# Patient Record
Sex: Male | Born: 1965
Health system: Southern US, Community
[De-identification: ages and names within clinical notes are randomized; demographics above are authoritative.]

## PROBLEM LIST (undated history)

## (undated) DIAGNOSIS — L409 Psoriasis, unspecified: Secondary | ICD-10-CM

## (undated) DIAGNOSIS — Z8719 Personal history of other diseases of the digestive system: Secondary | ICD-10-CM

## (undated) DIAGNOSIS — K219 Gastro-esophageal reflux disease without esophagitis: Secondary | ICD-10-CM

## (undated) DIAGNOSIS — E669 Obesity, unspecified: Secondary | ICD-10-CM

## (undated) DIAGNOSIS — Z8249 Family history of ischemic heart disease and other diseases of the circulatory system: Secondary | ICD-10-CM

## (undated) DIAGNOSIS — I10 Essential (primary) hypertension: Secondary | ICD-10-CM

## (undated) DIAGNOSIS — R5600 Simple febrile convulsions: Secondary | ICD-10-CM

## (undated) DIAGNOSIS — I872 Venous insufficiency (chronic) (peripheral): Secondary | ICD-10-CM

## (undated) DIAGNOSIS — J302 Other seasonal allergic rhinitis: Secondary | ICD-10-CM

## (undated) HISTORY — DX: Gastro-esophageal reflux disease without esophagitis: K21.9

## (undated) HISTORY — PX: TOE SURGERY: SHX1073

## (undated) HISTORY — DX: Psoriasis, unspecified: L40.9

## (undated) HISTORY — DX: Essential (primary) hypertension: I10

## (undated) HISTORY — PX: OTHER SURGICAL HISTORY: SHX169

## (undated) HISTORY — DX: Family history of ischemic heart disease and other diseases of the circulatory system: Z82.49

## (undated) HISTORY — DX: Obesity, unspecified: E66.9

## (undated) HISTORY — PX: CYSTECTOMY: SUR359

---

## 1998-01-15 ENCOUNTER — Ambulatory Visit (HOSPITAL_COMMUNITY): Admission: RE | Admit: 1998-01-15 | Discharge: 1998-01-15 | Payer: Self-pay | Admitting: Internal Medicine

## 2000-08-06 ENCOUNTER — Emergency Department (HOSPITAL_COMMUNITY): Admission: EM | Admit: 2000-08-06 | Discharge: 2000-08-06 | Payer: Self-pay | Admitting: Emergency Medicine

## 2001-01-12 ENCOUNTER — Encounter: Payer: Self-pay | Admitting: Gastroenterology

## 2001-01-18 ENCOUNTER — Encounter (INDEPENDENT_AMBULATORY_CARE_PROVIDER_SITE_OTHER): Payer: Self-pay | Admitting: Specialist

## 2001-01-18 ENCOUNTER — Encounter: Payer: Self-pay | Admitting: Gastroenterology

## 2001-01-18 ENCOUNTER — Other Ambulatory Visit: Admission: RE | Admit: 2001-01-18 | Discharge: 2001-01-18 | Payer: Self-pay | Admitting: Gastroenterology

## 2002-08-02 ENCOUNTER — Encounter: Payer: Self-pay | Admitting: Family Medicine

## 2002-08-02 ENCOUNTER — Encounter: Admission: RE | Admit: 2002-08-02 | Discharge: 2002-08-02 | Payer: Self-pay | Admitting: Family Medicine

## 2002-08-22 ENCOUNTER — Encounter: Admission: RE | Admit: 2002-08-22 | Discharge: 2002-08-22 | Payer: Self-pay | Admitting: General Surgery

## 2002-08-22 ENCOUNTER — Encounter: Payer: Self-pay | Admitting: General Surgery

## 2002-09-10 ENCOUNTER — Ambulatory Visit (HOSPITAL_BASED_OUTPATIENT_CLINIC_OR_DEPARTMENT_OTHER): Admission: RE | Admit: 2002-09-10 | Discharge: 2002-09-10 | Payer: Self-pay | Admitting: General Surgery

## 2002-09-10 ENCOUNTER — Encounter (INDEPENDENT_AMBULATORY_CARE_PROVIDER_SITE_OTHER): Payer: Self-pay | Admitting: *Deleted

## 2002-11-07 ENCOUNTER — Encounter: Payer: Self-pay | Admitting: Family Medicine

## 2002-11-07 ENCOUNTER — Encounter: Admission: RE | Admit: 2002-11-07 | Discharge: 2002-11-07 | Payer: Self-pay | Admitting: Family Medicine

## 2004-05-04 ENCOUNTER — Encounter: Payer: Self-pay | Admitting: Cardiology

## 2006-01-28 ENCOUNTER — Ambulatory Visit: Payer: Self-pay | Admitting: Family Medicine

## 2007-03-09 ENCOUNTER — Ambulatory Visit: Payer: Self-pay | Admitting: Family Medicine

## 2007-04-04 ENCOUNTER — Ambulatory Visit: Payer: Self-pay | Admitting: Family Medicine

## 2007-04-10 ENCOUNTER — Ambulatory Visit: Payer: Self-pay | Admitting: Family Medicine

## 2007-04-17 ENCOUNTER — Ambulatory Visit: Payer: Self-pay | Admitting: Family Medicine

## 2008-03-14 ENCOUNTER — Ambulatory Visit: Payer: Self-pay | Admitting: Family Medicine

## 2008-04-30 ENCOUNTER — Ambulatory Visit: Payer: Self-pay | Admitting: Family Medicine

## 2008-06-06 ENCOUNTER — Ambulatory Visit: Payer: Self-pay | Admitting: Family Medicine

## 2008-09-23 ENCOUNTER — Ambulatory Visit: Payer: Self-pay | Admitting: Gastroenterology

## 2008-09-23 DIAGNOSIS — R1013 Epigastric pain: Secondary | ICD-10-CM

## 2008-09-23 DIAGNOSIS — K219 Gastro-esophageal reflux disease without esophagitis: Secondary | ICD-10-CM

## 2008-09-25 ENCOUNTER — Ambulatory Visit (HOSPITAL_COMMUNITY): Admission: RE | Admit: 2008-09-25 | Discharge: 2008-09-25 | Payer: Self-pay | Admitting: Gastroenterology

## 2008-09-26 ENCOUNTER — Telehealth (INDEPENDENT_AMBULATORY_CARE_PROVIDER_SITE_OTHER): Payer: Self-pay

## 2008-10-04 ENCOUNTER — Ambulatory Visit (HOSPITAL_COMMUNITY): Admission: RE | Admit: 2008-10-04 | Discharge: 2008-10-04 | Payer: Self-pay | Admitting: Gastroenterology

## 2009-03-18 ENCOUNTER — Ambulatory Visit: Payer: Self-pay | Admitting: Family Medicine

## 2009-08-30 ENCOUNTER — Encounter: Admission: RE | Admit: 2009-08-30 | Discharge: 2009-08-30 | Payer: Self-pay | Admitting: Family Medicine

## 2009-09-03 ENCOUNTER — Ambulatory Visit: Payer: Self-pay | Admitting: Family Medicine

## 2009-09-26 ENCOUNTER — Encounter: Payer: Self-pay | Admitting: Cardiology

## 2009-09-26 ENCOUNTER — Ambulatory Visit: Payer: Self-pay | Admitting: Family Medicine

## 2009-11-10 ENCOUNTER — Ambulatory Visit: Payer: Self-pay | Admitting: Family Medicine

## 2010-01-13 ENCOUNTER — Ambulatory Visit: Payer: Self-pay | Admitting: Family Medicine

## 2010-01-13 ENCOUNTER — Encounter: Payer: Self-pay | Admitting: Cardiology

## 2010-03-20 ENCOUNTER — Ambulatory Visit: Payer: Self-pay | Admitting: Family Medicine

## 2010-03-20 ENCOUNTER — Encounter: Payer: Self-pay | Admitting: Cardiology

## 2010-03-20 DIAGNOSIS — Z9189 Other specified personal risk factors, not elsewhere classified: Secondary | ICD-10-CM | POA: Insufficient documentation

## 2010-03-20 DIAGNOSIS — I10 Essential (primary) hypertension: Secondary | ICD-10-CM

## 2010-03-31 ENCOUNTER — Ambulatory Visit: Payer: Self-pay | Admitting: Cardiology

## 2010-03-31 ENCOUNTER — Encounter: Payer: Self-pay | Admitting: Cardiology

## 2010-03-31 DIAGNOSIS — E785 Hyperlipidemia, unspecified: Secondary | ICD-10-CM | POA: Insufficient documentation

## 2010-03-31 DIAGNOSIS — R0602 Shortness of breath: Secondary | ICD-10-CM | POA: Insufficient documentation

## 2010-04-23 ENCOUNTER — Telehealth (INDEPENDENT_AMBULATORY_CARE_PROVIDER_SITE_OTHER): Payer: Self-pay | Admitting: *Deleted

## 2010-04-24 ENCOUNTER — Ambulatory Visit: Payer: Self-pay

## 2010-04-24 ENCOUNTER — Ambulatory Visit (HOSPITAL_COMMUNITY): Admission: RE | Admit: 2010-04-24 | Payer: Self-pay | Source: Home / Self Care | Admitting: Cardiology

## 2010-05-04 ENCOUNTER — Telehealth (INDEPENDENT_AMBULATORY_CARE_PROVIDER_SITE_OTHER): Payer: Self-pay | Admitting: *Deleted

## 2010-05-05 ENCOUNTER — Ambulatory Visit (HOSPITAL_COMMUNITY)
Admission: RE | Admit: 2010-05-05 | Discharge: 2010-05-05 | Payer: Self-pay | Source: Home / Self Care | Attending: Cardiovascular Disease | Admitting: Cardiovascular Disease

## 2010-06-12 ENCOUNTER — Ambulatory Visit
Admission: RE | Admit: 2010-06-12 | Discharge: 2010-06-12 | Payer: Self-pay | Source: Home / Self Care | Attending: Family Medicine | Admitting: Family Medicine

## 2010-06-16 NOTE — Progress Notes (Signed)
Summary: Stress Echo Pre-Procedure  Phone Note Outgoing Call Call back at [facility] Phone [phone]   Call placed by: Antionette Char RN,  April 23, 2010 8:30 AM Call placed to: Patient Reason for Call: Confirm/change Appt Summary of Call: Message left on AM regarding Stress Echo instructions.

## 2010-06-16 NOTE — Assessment & Plan Note (Signed)
Summary: NP6/ FAMILY H/O CARDIAC DISEASE. PT HAS BCBS./ [place]   CC:  referal from Dr Susann Givens.  History of Present Illness: 45 year old male for evaluation of multiple risk factors and dyspnea. No prior cardiac history. Recent LDL was 111. Patient states that he has dyspnea with more extreme activities but not with routine activities. It is relieved with rest. There is no associated chest pain. There is no orthopnea, PND, pedal edema, palpitations or syncope. There is no exertional chest pain. His father had a myocardial infarction at age 66 and died at age 65 of a second event. Because of the above we were asked to further evaluate.  Current Medications (verified): 1)  Nizatidine 150 Mg Caps (Nizatidine) .Marland Kitchen.. 1 Capsule By Mouth Once Daily 2)  Amlodipine Besylate 10 Mg Tabs (Amlodipine Besylate) .Marland Kitchen.. 1 Tablet By Mouth Once Daily 3)  Hydrochlorothiazide 25 Mg Tabs (Hydrochlorothiazide) .Marland Kitchen.. 1 Tablet By Mouth Once Daily 4)  Multivitamins   Tabs (Multiple Vitamin) .Marland Kitchen.. 1  Tab By Mouth Once Daily 5)  Aspirin 81 Mg  Tabs (Aspirin) .Marland Kitchen.. 1  Tab By Mouth Once Daily  Allergies: No Known Drug Allergies  Past History:  Past Medical History: HYPERTENSION  GERD Hiatal Hernia  Past Surgical History: R great toe surgery Cyst removed from left breast  Family History: Reviewed history from 09/23/2008 and no changes required. Family History of Heart Disease: Father with MI at age 81 and died at 69 of MI No FH of Colon Cancer:  Social History: Reviewed history from 09/23/2008 and no changes required. Married Patient has never smoked.  Alcohol Use - occasional Illicit Drug Use - no Occupation: Clinical biochemist. Daily Caffeine Use  3 cups per day  Review of Systems       no fevers or chills, productive cough, hemoptysis, dysphasia, odynophagia, melena, hematochezia, dysuria, hematuria, rash, seizure activity, orthopnea, PND, pedal edema, claudication. Remaining systems are negative.   Vital  Signs:  Patient profile:   45 year old male Height:      70 inches Weight:      252 pounds BMI:     36.29 Pulse rate:   76 / minute Resp:     14 per minute BP sitting:   129 / 88  (left arm)  Vitals Entered By: Kem Parkinson (March 31, 2010 3:35 PM)  Physical Exam  General:  Well developed/well nourished in NAD Skin warm/dry Patient not depressed No peripheral clubbing Back-normal HEENT-normal/normal eyelids Neck supple/normal carotid upstroke bilaterally; no bruits; no JVD; no thyromegaly chest - CTA/ normal expansion CV - RRR/normal S1 and S2; no murmurs, rubs or gallops;  PMI nondisplaced Abdomen -NT/ND, no HSM, no mass, + bowel sounds, no bruit 2+ femoral pulses, no bruits Ext-no edema, chords, 2+ DP Neuro-grossly nonfocal     EKG  Procedure date:  03/31/2010  Findings:      Sinus rhythm, first degree AV block, nonspecific T-wave changes.  Impression & Recommendations:  Problem # 1:  HYPERTENSION (ICD-401.9) Blood pressure borderline. He will continue on his present medications. He will track his blood pressure at home. Given his strong family history I would like for his systolic blood pressure to be less than 130 and his diastolic to be less than 85. His updated medication list for this problem includes:    Amlodipine Besylate 10 Mg Tabs (Amlodipine besylate) .Marland Kitchen... 1 tablet by mouth once daily    Hydrochlorothiazide 25 Mg Tabs (Hydrochlorothiazide) .Marland Kitchen... 1 tablet by mouth once daily    Aspirin 81  Mg Tabs (Aspirin) .Marland Kitchen... 1  tab by mouth once daily  Problem # 2:  DYSPNEA (ICD-786.05) Mild dyspnea with more extreme activities. Strong family history of coronary disease as well as hypertension. Schedule stress echocardiogram for risk stratification. This will also help quantify LV function. Given strong family history of coronary disease I have recommended that he begin an exercise program if the stress echocardiogram is normal. His updated medication list for  this problem includes:    Amlodipine Besylate 10 Mg Tabs (Amlodipine besylate) .Marland Kitchen... 1 tablet by mouth once daily    Hydrochlorothiazide 25 Mg Tabs (Hydrochlorothiazide) .Marland Kitchen... 1 tablet by mouth once daily    Aspirin 81 Mg Tabs (Aspirin) .Marland Kitchen... 1  tab by mouth once daily  Problem # 3:  HYPERLIPIDEMIA (ICD-272.4) LDL 111. He will begin a diet and have his primary care check his lipids again in 3 months. If LDL greater than 100 I would recommend a statin given his strong family history.  Other Orders: Stress Echo (Stress Echo)  Patient Instructions: 1)  Your physician has requested that you have a stress echocardiogram. For further information please visit [email or web address]  Please follow instruction sheet as given.

## 2010-06-18 NOTE — Letter (Signed)
Summary: [facility] Office Note   Randy [name]'s Office Note   Imported By: Roderic Ovens 04/27/2010 16:03:42  _____________________________________________________________________  External Attachment:    Type:   Image     Comment:   External Document

## 2010-06-18 NOTE — Progress Notes (Signed)
Summary: Stress Echo instructions  Phone Note Outgoing Call Call back at [facility] Phone [phone]   Call placed by: Stanton Kidney, EMT-P,  May 04, 2010 3:18 PM Action Taken: Phone Call Completed Summary of Call: Left message for instructions ref: stress echo. Stanton Kidney, EMT-P  May 04, 2010 3:19 PM

## 2010-06-18 NOTE — Letter (Signed)
Summary: [facility] Office Note   [facility] Office Note   Imported By: Roderic Ovens 04/27/2010 16:04:50  _____________________________________________________________________  External Attachment:    Type:   Image     Comment:   External Document

## 2010-06-18 NOTE — Progress Notes (Signed)
Summary: Patient Medical History/Med List   Patient Medical History/Med List   Imported By: Roderic Ovens 04/27/2010 16:03:06  _____________________________________________________________________  External Attachment:    Type:   Image     Comment:   External Document

## 2010-06-18 NOTE — Letter (Signed)
Summary: [facility] Ekg   Imported By: Roderic Ovens 04/27/2010 16:07:50  _____________________________________________________________________  External Attachment:    Type:   Image     Comment:   External Document

## 2010-09-15 ENCOUNTER — Ambulatory Visit (INDEPENDENT_AMBULATORY_CARE_PROVIDER_SITE_OTHER): Payer: BC Managed Care – PPO | Admitting: Medical

## 2010-09-15 DIAGNOSIS — T148XXA Other injury of unspecified body region, initial encounter: Secondary | ICD-10-CM

## 2010-10-02 NOTE — Op Note (Signed)
   NAMESAYLOR, Randy [name]                          ACCOUNT NO.:  [id number]   MEDICAL RECORD NO.:  [id number]                   PATIENT TYPE:  AMB   LOCATION:  DSC                                  FACILITY:  [facility]   PHYSICIAN:  Gabrielle Dare. Janee Morn, M.D.             DATE OF BIRTH:  1965-07-20   DATE OF PROCEDURE:  09/10/2002  DATE OF DISCHARGE:                                 OPERATIVE REPORT   PREOPERATIVE DIAGNOSIS:  Right subareolar breast mass.   POSTOPERATIVE DIAGNOSIS:  Right subareolar breast mass.   PROCEDURE:  Excision of right subareolar breast mass.   SURGEON:  Gabrielle Dare. Janee Morn, M.D.   ANESTHESIA:  MAC with local.   CLINICAL HISTORY:  The patient is a 45 year old male with a history of  hypertension, who is very active in weight training and sports and presented  complaining with a two to three-month history of discomfort beneath his  right nipple.  I evaluated him and diagnosed him with a likely gynecomastia.  He was subsequently evaluated at the breast center with mammography and this  was consistent with benign gynecomastia, and we are proceeding today with  excision of this subareolar mass.   DESCRIPTION OF PROCEDURE:  After informed consent was obtained, the patient  was brought to the operating room, intravenous antibiotics were  administered, MAC anesthesia was given.  His right chest was prepped and  draped in a sterile fashion.  Marcaine 0.25% with epinephrine in a 1:1 ratio  with 1% lidocaine was infused for local anesthesia.  A curvilinear infra-  areolar incision was made along his areolar border inferiorly.  Subcutaneous  tissues were dissected down and this mass was carefully dissected from the  underside of the areola and circumferentially down.  Bovie cautery was used  to maintain excellent hemostasis, and the mass was excised in one piece.  It  was firm and rubbery and about 3 cm in size.  The remaining tissue bed was  irrigated.  No other abnormalities  were palpable.  Bovie cautery was used to  ensure hemostasis and the wound was closed with running 4-0 Monocryl  subcuticular stitch.  Benzoin and Steri-Strips and sterile dressings were  applied.  Sponge, needle, and instrument counts were all correct.  The  patient tolerated the procedure well and was taken to the recovery room in  stable condition.                                               Gabrielle Dare Janee Morn, M.D.    BET/MEDQ  D:  09/10/2002  T:  09/10/2002  Job:  [id number]

## 2010-11-11 ENCOUNTER — Encounter: Payer: Self-pay | Admitting: Medical

## 2010-11-11 ENCOUNTER — Other Ambulatory Visit: Payer: Self-pay

## 2010-11-11 ENCOUNTER — Ambulatory Visit (INDEPENDENT_AMBULATORY_CARE_PROVIDER_SITE_OTHER): Payer: BC Managed Care – PPO | Admitting: Medical

## 2010-11-11 VITALS — BP 140/80 | HR 80 | Temp 98.0°F | Ht 71.0 in | Wt 252.0 lb

## 2010-11-11 DIAGNOSIS — I8390 Asymptomatic varicose veins of unspecified lower extremity: Secondary | ICD-10-CM

## 2010-11-11 DIAGNOSIS — M25571 Pain in right ankle and joints of right foot: Secondary | ICD-10-CM

## 2010-11-11 DIAGNOSIS — R609 Edema, unspecified: Secondary | ICD-10-CM

## 2010-11-11 DIAGNOSIS — M25579 Pain in unspecified ankle and joints of unspecified foot: Secondary | ICD-10-CM

## 2010-11-11 DIAGNOSIS — S96919A Strain of unspecified muscle and tendon at ankle and foot level, unspecified foot, initial encounter: Secondary | ICD-10-CM

## 2010-11-11 DIAGNOSIS — S93409A Sprain of unspecified ligament of unspecified ankle, initial encounter: Secondary | ICD-10-CM

## 2010-11-11 MED ORDER — IBUPROFEN 800 MG PO TABS: 800.0000 mg | ORAL_TABLET | Freq: Three times a day (TID) | ORAL | Status: AC | PRN

## 2010-11-11 NOTE — Progress Notes (Addendum)
  Subjective:   HPI  Randy [name] is a 45 y.o. male who presents for c/o right ankle pain x 5 days.  Was at the beach last week on vacation, was throwing footballs, and had a twisting injury.  He denies pop or snap.  Denies trauma directly. No fall.  Been using ice. Has been able to bear weight then and now.  No other aggravating or relieving factors.  No other c/o.  He c/o ankle swelling in general since being on Amlodipine.  Want to know if any thing can be done about this.  He also c/o possible varicose vein of his right thigh.  At times gest irritated or prominent if he has been on his feet for prolonged period of time.  The following portions of the patient's history were reviewed and updated as appropriate: allergies, current medications, past family history, past medical history, past social history, past surgical history and problem list.   Review of Systems Constitutional: denies fever, chills, sweats, unexpected weight change, anorexia, fatigue Dermatology: denies rash Cardiology: denies chest pain, palpitations, edema Respiratory: denies cough, shortness of breath, wheezing Gastroenterology: denies abdominal pain, nausea, vomiting, diarrhea Neurology: no weakness, tingling, numbness      Objective:   Physical Exam  General appearance: alert, no distress, WD/WN, white male Musculoskeletal: right medial ankle with tenderness over deltoid ligament as well as superior to medial malleolus and lower calve tenderness.   No tenderness of the achilles, mild pain with foot inversion, mild swelling over medial ankle generalized, otherwise bilat LE nontender, no swelling, no obvious deformity, normal ROM Extremities: right upper medial thigh with a few mild varicosities, no cyanosis, no clubbing Pulses: 2+ symmetric, lower extremities, normal cap refill Neurological: normal feet sensation and strength   Assessment :    Encounter Diagnoses  Name Primary?  Marland Kitchen Ankle pain, right Yes    . Ankle strain   . Edema   . Varicose vein of leg      Plan:    Ankle strain - discussed xray findings, and no apparent fracture seen or other acute abnormality.  Will send xray for over read.  Advised rest, ice, elevation, script for Ibuprofen given.   Call report in 1-2 wk.  If not improving return.   Discussed rehab exercises once the pain and swelling has resolved.  Edema - secondary to Amlodipine.  Reassured.    Varicose veins - mild, upper right leg, advised regular exercise.

## 2010-11-16 ENCOUNTER — Encounter: Payer: Self-pay | Admitting: Family Medicine

## 2011-02-05 ENCOUNTER — Other Ambulatory Visit (INDEPENDENT_AMBULATORY_CARE_PROVIDER_SITE_OTHER): Payer: BC Managed Care – PPO

## 2011-02-05 DIAGNOSIS — Z23 Encounter for immunization: Secondary | ICD-10-CM

## 2011-03-29 ENCOUNTER — Encounter: Payer: Self-pay | Admitting: Family Medicine

## 2011-03-29 ENCOUNTER — Ambulatory Visit (INDEPENDENT_AMBULATORY_CARE_PROVIDER_SITE_OTHER): Payer: BC Managed Care – PPO | Admitting: Family Medicine

## 2011-03-29 VITALS — BP 122/82 | HR 76 | Ht 71.25 in | Wt 244.0 lb

## 2011-03-29 DIAGNOSIS — E669 Obesity, unspecified: Secondary | ICD-10-CM | POA: Insufficient documentation

## 2011-03-29 DIAGNOSIS — Z0000 Encounter for general adult medical examination without abnormal findings: Secondary | ICD-10-CM

## 2011-03-29 DIAGNOSIS — Z8249 Family history of ischemic heart disease and other diseases of the circulatory system: Secondary | ICD-10-CM

## 2011-03-29 DIAGNOSIS — I10 Essential (primary) hypertension: Secondary | ICD-10-CM

## 2011-03-29 DIAGNOSIS — E785 Hyperlipidemia, unspecified: Secondary | ICD-10-CM

## 2011-03-29 DIAGNOSIS — K219 Gastro-esophageal reflux disease without esophagitis: Secondary | ICD-10-CM

## 2011-03-29 DIAGNOSIS — I8393 Asymptomatic varicose veins of bilateral lower extremities: Secondary | ICD-10-CM

## 2011-03-29 DIAGNOSIS — I8390 Asymptomatic varicose veins of unspecified lower extremity: Secondary | ICD-10-CM

## 2011-03-29 LAB — LIPID PANEL
Total CHOL/HDL Ratio: 4.3 Ratio
VLDL: 27 mg/dL (ref 0–40)

## 2011-03-29 LAB — POCT URINALYSIS DIPSTICK
Blood, UA: NEGATIVE
Glucose, UA: NEGATIVE
Leukocytes, UA: NEGATIVE
Nitrite, UA: NEGATIVE
Urobilinogen, UA: NEGATIVE

## 2011-03-29 LAB — CBC WITH DIFFERENTIAL/PLATELET
Basophils Absolute: 0 10*3/uL (ref 0.0–0.1)
Eosinophils Absolute: 0.2 10*3/uL (ref 0.0–0.7)
Eosinophils Relative: 2 % (ref 0–5)
Lymphocytes Relative: 27 % (ref 12–46)
MCH: 30.1 pg (ref 26.0–34.0)
MCV: 86.3 fL (ref 78.0–100.0)
Platelets: 343 10*3/uL (ref 150–400)
RDW: 13.1 % (ref 11.5–15.5)
WBC: 7.8 10*3/uL (ref 4.0–10.5)

## 2011-03-29 LAB — COMPREHENSIVE METABOLIC PANEL
ALT: 32 U/L (ref 0–53)
AST: 18 U/L (ref 0–37)
Creat: 1.13 mg/dL (ref 0.50–1.35)
Total Bilirubin: 0.6 mg/dL (ref 0.3–1.2)

## 2011-03-29 NOTE — Progress Notes (Signed)
Subjective:    Patient ID: Randy Randy [name], male    DOB: [birth year], 45 y.o.   MRN: [id number]  HPI  he is here for complete examination. He has had some difficulty with medial thigh pain and swelling and does have varicosities in that area. He also complains of occasionally seeing specks of blood in his sperm. Last time this occurred was over a month ago. He is having no urgency, dysuria, discharge. He does have a history of heart disease in his family. He has recently made some dietary and exercise changes and apparently has lost will do the way. His reflux is under good control present medications.   Review of Systems  Constitutional: Negative.   HENT: Negative.   Eyes: Negative.   Respiratory: Negative.   Cardiovascular: Negative.   Gastrointestinal: Negative.   Genitourinary: Negative.   Musculoskeletal: Negative.   Skin: Negative.   Neurological: Negative.   Hematological: Negative.   Psychiatric/Behavioral: Negative.        Objective:   Physical Exam BP 122/82  Pulse 76  Ht 5' 11.25" (1.81 m)  Wt 244 lb (110.678 kg)  BMI 33.79 kg/m2  General Appearance:    Alert, cooperative, no distress, appears stated age  Head:    Normocephalic, without obvious abnormality, atraumatic  Eyes:    PERRL, conjunctiva/corneas clear, EOM's intact, fundi    benign  Ears:    Normal TM's and external ear canals  Nose:   Nares normal, mucosa normal, no drainage or sinus   tenderness  Throat:   Lips, mucosa, and tongue normal; teeth and gums normal  Neck:   Randy [name], no lymphadenopathy;  thyroid:  no   enlargement/tenderness/nodules; no carotid   bruit or JVD  Back:    Spine nontender, no curvature, ROM normal, no CVA     tenderness  Lungs:     Clear to auscultation bilaterally without wheezes, rales or     ronchi; respirations unlabored  Chest Wall:    No tenderness or deformity   Heart:    Regular rate and rhythm, S1 and S2 normal, no murmur, rub   or gallop  Breast Exam:    No chest wall  tenderness, masses or gynecomastia  Abdomen:     Soft, non-tender, nondistended, normoactive bowel sounds,    no masses, no hepatosplenomegaly  Genitalia:    Normal male external genitalia without lesions.  Testicles without masses.  No inguinal hernias.  Rectal:    Normal sphincter tone, no masses or tenderness; guaiac negative stool.  Prostate smooth, no nodules, not enlarged.  Extremities:   No clubbing, cyanosis or edema  Pulses:   2+ and symmetric all extremities  Skin:   Skin color, texture, turgor normal, no rashes or lesions  Lymph nodes:   Cervical, supraclavicular, and axillary nodes normal  Neurologic:   CNII-XII intact, normal strength, sensation and gait; reflexes 2+ and symmetric throughout          Psych:   Normal mood, affect, hygiene and grooming.          Assessment & Plan:   1. Physical exam, annual  POCT Urinalysis Dipstick, Visual acuity screening, POCT Hemoccult (POC) Blood/Stool Test, CBC with Differential, Comprehensive metabolic panel, Lipid panel  2. GERD    3. HYPERLIPIDEMIA    4. HYPERTENSION    5. Family history of heart disease in male family member before age 51  CBC with Differential, Comprehensive metabolic panel, Lipid panel  6. Obesity (BMI 30-39.9)    7.  Varicose veins of legs

## 2011-03-29 NOTE — Patient Instructions (Addendum)
Continue with present weight loss and dietary modification. He is also return here if he sees blood in his sperm again. No particular therapy for his varicosities unless they get worse

## 2011-03-31 ENCOUNTER — Other Ambulatory Visit: Payer: Self-pay | Admitting: Family Medicine

## 2011-04-19 ENCOUNTER — Other Ambulatory Visit: Payer: Self-pay | Admitting: Family Medicine

## 2011-05-31 ENCOUNTER — Ambulatory Visit: Payer: BC Managed Care – PPO | Admitting: Family Medicine

## 2011-06-01 ENCOUNTER — Telehealth: Payer: Self-pay | Admitting: Family Medicine

## 2011-06-01 MED ORDER — HYDROCHLOROTHIAZIDE 25 MG PO TABS: 25.0000 mg | ORAL_TABLET | Freq: Every day | ORAL | Status: DC

## 2011-06-01 MED ORDER — NIZATIDINE 150 MG PO CAPS: 150.0000 mg | ORAL_CAPSULE | Freq: Every day | ORAL | Status: DC

## 2011-06-01 MED ORDER — AMLODIPINE BESYLATE 10 MG PO TABS: 10.0000 mg | ORAL_TABLET | Freq: Every day | ORAL | Status: DC

## 2011-06-01 NOTE — Telephone Encounter (Signed)
Refilled for #90 with 3 refills on hctz and norvasc and #90 with 2 refills on nizatide

## 2011-06-07 ENCOUNTER — Other Ambulatory Visit: Payer: Managed Care, Other (non HMO)

## 2011-06-07 DIAGNOSIS — E785 Hyperlipidemia, unspecified: Secondary | ICD-10-CM

## 2011-06-07 LAB — LIPID PANEL
HDL: 40 mg/dL (ref >39)
Total CHOL/HDL Ratio: 4.4 Ratio
Triglycerides: 117 mg/dL (ref <150)

## 2011-12-26 ENCOUNTER — Emergency Department (HOSPITAL_COMMUNITY)
Admission: EM | Admit: 2011-12-26 | Discharge: 2011-12-26 | Disposition: A | Discharge status: Home / Self Care | Payer: Managed Care, Other (non HMO) | Attending: Emergency Medicine | Admitting: Emergency Medicine

## 2011-12-26 ENCOUNTER — Encounter (HOSPITAL_COMMUNITY): Payer: Self-pay

## 2011-12-26 ENCOUNTER — Emergency Department (HOSPITAL_COMMUNITY): Payer: Managed Care, Other (non HMO)

## 2011-12-26 DIAGNOSIS — R079 Chest pain, unspecified: Secondary | ICD-10-CM | POA: Insufficient documentation

## 2011-12-26 DIAGNOSIS — I10 Essential (primary) hypertension: Secondary | ICD-10-CM | POA: Insufficient documentation

## 2011-12-26 DIAGNOSIS — K219 Gastro-esophageal reflux disease without esophagitis: Secondary | ICD-10-CM | POA: Insufficient documentation

## 2011-12-26 LAB — COMPREHENSIVE METABOLIC PANEL
ALT: 22 U/L (ref 0–53)
Alkaline Phosphatase: 70 U/L (ref 39–117)
CO2: 28 mEq/L (ref 19–32)
Chloride: 96 mEq/L (ref 96–112)
GFR calc Af Amer: >90 mL/min (ref >90)
Glucose, Bld: 122 mg/dL — ABNORMAL HIGH (ref 70–99)
Potassium: 3 mEq/L — ABNORMAL LOW (ref 3.5–5.1)
Sodium: 135 mEq/L (ref 135–145)
Total Bilirubin: 0.6 mg/dL (ref 0.3–1.2)
Total Protein: 7.8 g/dL (ref 6.0–8.3)

## 2011-12-26 LAB — CBC WITH DIFFERENTIAL/PLATELET
Basophils Absolute: 0 10*3/uL (ref 0.0–0.1)
Basophils Relative: 0 % (ref 0–1)
Eosinophils Absolute: 0.1 10*3/uL (ref 0.0–0.7)
Eosinophils Relative: 1 % (ref 0–5)
HCT: 44.7 % (ref 39.0–52.0)
MCHC: 36 g/dL (ref 30.0–36.0)
MCV: 85.3 fL (ref 78.0–100.0)
Monocytes Absolute: 0.8 10*3/uL (ref 0.1–1.0)
RDW: 12.3 % (ref 11.5–15.5)

## 2011-12-26 NOTE — ED Notes (Signed)
The patient is AOx4 and comfortable with his discharge instructions. 

## 2011-12-26 NOTE — ED Notes (Signed)
Pt reports his father passed away at the age of 61 w/an MI

## 2011-12-26 NOTE — ED Provider Notes (Signed)
History     CSN: [id number]  Arrival date & time 12/26/11  1757   First MD Initiated Contact with Patient 12/26/11 1937      Chief Complaint  Patient presents with  . Chest Pain    (Consider location/radiation/quality/duration/timing/severity/associated sxs/prior treatment) HPI  Past Medical History  Diagnosis Date  . Hypertension   . Obesity   . Psoriasis   . Family history of ischemic heart disease   . GERD (gastroesophageal reflux disease)     History reviewed. No pertinent past surgical history.  Family History  Problem Relation Age of Onset  . Heart disease Father   . Hypertension Father   . Heart failure Father     History  Substance Use Topics  . Smoking status: Never Smoker   . Smokeless tobacco: Never Used  . Alcohol Use: 1.5 oz/week    3 drink(s) per week      Review of Systems  Allergies  Review of patient's allergies indicates no known allergies.  Home Medications   Current Outpatient Rx  Name Route Sig Dispense Refill  . AMLODIPINE BESYLATE 10 MG PO TABS Oral Take 10 mg by mouth daily.    . ASPIRIN 81 MG PO TABS Oral Take 81 mg by mouth daily.      . OMEGA-3 FATTY ACIDS 1000 MG PO CAPS Oral Take 3 g by mouth daily.    Marland Kitchen HYDROCHLOROTHIAZIDE 25 MG PO TABS Oral Take 25 mg by mouth daily.    . ADULT MULTIVITAMIN W/MINERALS CH Oral Take 1 tablet by mouth daily.    Marland Kitchen NIZATIDINE 150 MG PO CAPS Oral Take 150 mg by mouth daily.      BP 138/87  Pulse 74  Temp 98.5 F (36.9 C) (Oral)  Resp 16  Ht 5' 10.5" (1.791 m)  Wt 237 lb (107.502 kg)  BMI 33.53 kg/m2  SpO2 100%  Physical Exam  ED Course  Procedures (including critical care time)  Labs Reviewed  COMPREHENSIVE METABOLIC PANEL - Abnormal; Notable for the following:    Potassium 3.0 (*)     Glucose, Bld 122 (*)     GFR calc non Af Amer 78 (*)     All other components within normal limits  CBC WITH DIFFERENTIAL  POCT I-STAT TROPONIN I   Dg Chest 2 View  12/26/2011  *RADIOLOGY  REPORT*  Clinical Data: Chest pain  CHEST - 2 VIEW  Comparison: None.  Findings: Mild patchy left lower lobe opacity, atelectasis versus pneumonia.  No pleural effusion or pneumothorax.  Heart is normal in size.  Mild degenerative changes of the visualized thoracolumbar spine.  IMPRESSION: Mild patchy left lower lobe opacity, atelectasis versus pneumonia.  Original Report Authenticated By: Charline Bills, M.D.     1. Chest pain       MDM  Chest pain        Cheri Guppy, MD 12/26/11 2030

## 2011-12-26 NOTE — ED Notes (Signed)
Pt reports dizziness and nauseous on Thursday, pt reports waking Friday w/pain behind (L) ear radiating into (L) lateral neck, pt went to UC yesterday they completed an EKG and instructed pt to f/u w/cardiologist on Monday. Pt reports mid-sternum chest tightness radiating under (R) breast, and abd pressure/tightness. Pt reports decrease appetite today

## 2011-12-27 ENCOUNTER — Encounter: Payer: Self-pay | Admitting: Internal Medicine

## 2011-12-27 ENCOUNTER — Telehealth: Payer: Self-pay | Admitting: Family Medicine

## 2011-12-27 ENCOUNTER — Other Ambulatory Visit: Payer: Self-pay

## 2011-12-27 DIAGNOSIS — R9431 Abnormal electrocardiogram [ECG] [EKG]: Secondary | ICD-10-CM

## 2011-12-27 NOTE — Telephone Encounter (Signed)
Seen in [place] and at [facility] [address], patient states you have a copy of his EKG (brought in to you by his sister today). The urgent care is calling him wanting to know who his cardiologist is and he wants to talk to you. Did tell patient that according to his chart, we scheduled him to see cardiologist Olga Millers back in 2011.  Patient would like you to call him

## 2011-12-27 NOTE — Telephone Encounter (Signed)
Set him up to see Dr. Jens Som for abnormal EKG.

## 2011-12-27 NOTE — Telephone Encounter (Signed)
PT HAS APPT WITH CRENSHAW AUG.27 AT 11;15 PT AWARE Doffing [phone]

## 2012-01-11 ENCOUNTER — Ambulatory Visit (INDEPENDENT_AMBULATORY_CARE_PROVIDER_SITE_OTHER): Payer: Managed Care, Other (non HMO) | Admitting: Cardiology

## 2012-01-11 ENCOUNTER — Encounter: Payer: Self-pay | Admitting: Cardiology

## 2012-01-11 VITALS — BP 136/86 | HR 71 | Ht 70.0 in | Wt 241.0 lb

## 2012-01-11 DIAGNOSIS — R9431 Abnormal electrocardiogram [ECG] [EKG]: Secondary | ICD-10-CM | POA: Insufficient documentation

## 2012-01-11 DIAGNOSIS — I10 Essential (primary) hypertension: Secondary | ICD-10-CM

## 2012-01-11 DIAGNOSIS — E875 Hyperkalemia: Secondary | ICD-10-CM

## 2012-01-11 DIAGNOSIS — R079 Chest pain, unspecified: Secondary | ICD-10-CM | POA: Insufficient documentation

## 2012-01-11 LAB — BASIC METABOLIC PANEL
BUN: 11 mg/dL (ref 6–23)
Chloride: 100 mEq/L (ref 96–112)
Creatinine, Ser: 1.1 mg/dL (ref 0.4–1.5)
GFR: 74.79 mL/min (ref >60.00)
Glucose, Bld: 97 mg/dL (ref 70–99)
Potassium: 3.4 mEq/L — ABNORMAL LOW (ref 3.5–5.1)

## 2012-01-11 NOTE — Assessment & Plan Note (Signed)
Blood pressure controlled. Continue present medications. His potassium was low in the emergency room. Repeat and supplement as needed.

## 2012-01-11 NOTE — Patient Instructions (Addendum)
Your physician wants you to follow-up in: ONE YEAR WITH DR CRENSHAW You will receive a reminder letter in the mail two months in advance. If you don't receive a letter, please call our office to schedule the follow-up appointment.  

## 2012-01-11 NOTE — Assessment & Plan Note (Signed)
Patient developed mild chest pressure when he was "stressed" from being told he had had a previous MI. He is not having exertional chest pain. Previous stress echocardiogram normal. I do not think further ischemia evaluation is warranted at this time.

## 2012-01-11 NOTE — Assessment & Plan Note (Signed)
I have reviewed the patient's electrocardiogram. The tracing on August 10 showed nondiagnostic inferior Q waves. I do not think he has had a previous myocardial infarction.

## 2012-01-11 NOTE — Progress Notes (Signed)
   HPI: 46 year old male for evaluation of abnormal electrocardiogram and chest pain. Stress echocardiogram in December of 2011 normal. Patient seen in the emergency room in August of 2013 with complaints of chest pain. Electrocardiogram showed sinus rhythm with no ST changes. Chest x-ray showed mild patchy left lower lobe opacity, atelectasis versus pneumonia. 1 troponin normal. Hemoglobin 16.1. Potassium 3.0. Patient recently seen at urgent care for neck pain. During that evaluation he was told he may have had a previous heart attack based on his electrocardiogram. The following night he had mild tightness for 1 minute in his chest because he was "stressed". He does not have dyspnea on exertion, orthopnea, PND, pedal edema, syncope or exertional chest pain.  Current Outpatient Prescriptions  Medication Sig Dispense Refill  . amLODipine (NORVASC) 10 MG tablet Take 10 mg by mouth daily.      Marland Kitchen aspirin 81 MG tablet Take 81 mg by mouth daily.        . hydrochlorothiazide (HYDRODIURIL) 25 MG tablet Take 25 mg by mouth daily.      . Multiple Vitamin (MULTIVITAMIN WITH MINERALS) TABS Take 1 tablet by mouth daily.      . nizatidine (AXID) 150 MG capsule Take 150 mg by mouth daily.      . Omega-3 Fatty Acids (FISH OIL PO) Take 1 tablet by mouth daily.         Past Medical History  Diagnosis Date  . Hypertension   . Obesity   . Psoriasis   . Family history of ischemic heart disease   . GERD (gastroesophageal reflux disease)     No past surgical history on file.  History   Social History  . Marital Status: Married    Spouse Name: N/A    Number of Children: N/A  . Years of Education: N/A   Occupational History  . Not on file.   Social History Main Topics  . Smoking status: Never Smoker   . Smokeless tobacco: Never Used  . Alcohol Use: 1.5 oz/week    3 drink(s) per week  . Drug Use: No  . Sexually Active: Yes   Other Topics Concern  . Not on file   Social History Narrative  . No  narrative on file    ROS: no fevers or chills, productive cough, hemoptysis, dysphasia, odynophagia, melena, hematochezia, dysuria, hematuria, rash, seizure activity, orthopnea, PND, pedal edema, claudication. Remaining systems are negative.  Physical Exam: Well-developed well-nourished in no acute distress.  Skin is warm and dry.  HEENT is normal.  Neck is supple. No thyromegaly.  Chest is clear to auscultation with normal expansion.  Cardiovascular exam is regular rate and rhythm.  Abdominal exam nontender or distended. No masses palpated. Extremities show no edema. neuro grossly intact  ECG 12/25/11-sinus rhythm with first degree AV block, nondiagnostic inferior Q waves. No ST changes. 12/26/11-sinus rhythm, first degree AV block, no ST changes.

## 2012-01-18 ENCOUNTER — Telehealth: Payer: Self-pay | Admitting: Cardiology

## 2012-01-18 DIAGNOSIS — E876 Hypokalemia: Secondary | ICD-10-CM

## 2012-01-18 MED ORDER — POTASSIUM CHLORIDE CRYS ER 20 MEQ PO TBCR: 20.0000 meq | EXTENDED_RELEASE_TABLET | Freq: Every day | ORAL | Status: DC

## 2012-01-18 NOTE — Telephone Encounter (Signed)
Spoke with pt, aware of lab results. 

## 2012-01-18 NOTE — Addendum Note (Signed)
Addended by: Freddi Starr on: 01/18/2012 02:00 PM   Modules accepted: Orders

## 2012-01-18 NOTE — Telephone Encounter (Signed)
Please return call to patient [phone] regarding labs.

## 2012-01-25 ENCOUNTER — Other Ambulatory Visit (INDEPENDENT_AMBULATORY_CARE_PROVIDER_SITE_OTHER): Payer: Managed Care, Other (non HMO)

## 2012-01-25 DIAGNOSIS — E876 Hypokalemia: Secondary | ICD-10-CM

## 2012-01-26 LAB — BASIC METABOLIC PANEL
CO2: 27 mEq/L (ref 19–32)
Calcium: 9.5 mg/dL (ref 8.4–10.5)
Creatinine, Ser: 1 mg/dL (ref 0.4–1.5)
Glucose, Bld: 77 mg/dL (ref 70–99)

## 2012-03-01 ENCOUNTER — Encounter: Payer: Self-pay | Admitting: Medical

## 2012-03-01 ENCOUNTER — Ambulatory Visit (INDEPENDENT_AMBULATORY_CARE_PROVIDER_SITE_OTHER): Payer: Managed Care, Other (non HMO) | Admitting: Medical

## 2012-03-01 VITALS — BP 122/80 | HR 84 | Temp 98.1°F | Resp 16 | Wt 246.0 lb

## 2012-03-01 DIAGNOSIS — M79609 Pain in unspecified limb: Secondary | ICD-10-CM

## 2012-03-01 DIAGNOSIS — M79606 Pain in leg, unspecified: Secondary | ICD-10-CM

## 2012-03-01 DIAGNOSIS — I8390 Asymptomatic varicose veins of unspecified lower extremity: Secondary | ICD-10-CM

## 2012-03-01 DIAGNOSIS — I809 Phlebitis and thrombophlebitis of unspecified site: Secondary | ICD-10-CM

## 2012-03-01 NOTE — Progress Notes (Signed)
Subjective: Here for pain in inner thighs.  Over the weekend was washing and waxing his cars, squatting down, up and down, using his back and legs.  Over the last few days has tenderness along right inner thigh that radiates down medial knee and into medial lower leg.  Has tingling sensation in right upper thigh.  Has mild similar pain in left inner thigh.  No fever, no NVD, no rash.  Denies CP, SOB, palpations, swelling. No hx/o DVT/PE.  No recent long travel, injury, trauma, no extended bed rest.   Past Medical History  Diagnosis Date  . Hypertension   . Obesity   . Psoriasis   . Family history of ischemic heart disease   . GERD (gastroesophageal reflux disease)    ROS as above in HPI  Objective: Gen: wd, wn, nad Skin: no erythema, warmth, fluctuance, induration MSK: tenderness of palpation to right inner thigh from proximal medial 1/3 of right thigh to just proximal to knee medially.  There appears to be palpable tender vein inflamed in right thigh medial and proximal to knee.  There are mild varicosities of posterior calves and thighs bilat.   Legs with normal ROM, no other tenderness or deformity.  ROM of both legs normal throughout.  Neurovascularly intact LE Ext: no asymmetry of ankles and lower legs, no obvious swelling  Assessment: Encounter Diagnoses  Name Primary?  . Thrombophlebitis Yes  . Leg pain   . Varicose veins     Plan: Advised that exam suggests superficial thrombophlebitis.  Nevertheless, we will check D-Dimer.  Advised leg elevation, warm compresses, increase aspirin to 325mg  daily for the next week.  symptoms should gradually resolve.  If d-dimer +, will get doppler ultrasound of right lower extremity.

## 2012-03-02 NOTE — Progress Notes (Signed)
Quick Note:  Called pt cell and left message to please return call ______

## 2012-03-02 NOTE — Progress Notes (Signed)
Quick Note:  Pt was informed word for word of labs and on plan he verbalized understanding  ______

## 2012-03-05 ENCOUNTER — Emergency Department (HOSPITAL_COMMUNITY)
Admission: EM | Admit: 2012-03-05 | Discharge: 2012-03-05 | Disposition: A | Discharge status: Home / Self Care | Payer: Managed Care, Other (non HMO) | Attending: Emergency Medicine | Admitting: Emergency Medicine

## 2012-03-05 ENCOUNTER — Encounter (HOSPITAL_COMMUNITY): Payer: Self-pay | Admitting: Emergency Medicine

## 2012-03-05 DIAGNOSIS — E669 Obesity, unspecified: Secondary | ICD-10-CM | POA: Insufficient documentation

## 2012-03-05 DIAGNOSIS — I8000 Phlebitis and thrombophlebitis of superficial vessels of unspecified lower extremity: Secondary | ICD-10-CM | POA: Insufficient documentation

## 2012-03-05 DIAGNOSIS — I10 Essential (primary) hypertension: Secondary | ICD-10-CM | POA: Insufficient documentation

## 2012-03-05 DIAGNOSIS — Z8249 Family history of ischemic heart disease and other diseases of the circulatory system: Secondary | ICD-10-CM | POA: Insufficient documentation

## 2012-03-05 DIAGNOSIS — K219 Gastro-esophageal reflux disease without esophagitis: Secondary | ICD-10-CM | POA: Insufficient documentation

## 2012-03-05 DIAGNOSIS — I809 Phlebitis and thrombophlebitis of unspecified site: Secondary | ICD-10-CM

## 2012-03-05 DIAGNOSIS — Z7982 Long term (current) use of aspirin: Secondary | ICD-10-CM | POA: Insufficient documentation

## 2012-03-05 MED ORDER — IBUPROFEN 600 MG PO TABS: 600.0000 mg | ORAL_TABLET | Freq: Four times a day (QID) | ORAL | Status: DC | PRN

## 2012-03-05 NOTE — ED Notes (Signed)
Pt c/o right leg pain with heat sensation since Monday. Pt seen by PMD on Wednesday and diagnosed with thrombophlebitis. Pt also c/o pain to left leg area. Pt request to have ultrasound done to check for clots.

## 2012-03-05 NOTE — ED Provider Notes (Signed)
History     CSN: [id number]  Arrival date & time 03/05/12  1478   First MD Initiated Contact with Patient 03/05/12 2122      Chief Complaint  Patient presents with  . Leg Pain    right upper leg     (Consider location/radiation/quality/duration/timing/severity/associated sxs/prior treatment) HPI Pt c/o right leg pain with heat sensation since Monday. Pt seen by PMD on Wednesday and diagnosed with thrombophlebitis. Pt also c/o pain to left leg area.  Patient had a D-dimer test which was negative.  Patient has a Well's score of low probability of DVT.  Patient denies shortness of breath or pleuritic chest pain.  Patient's had no history of DVT. Past Medical History  Diagnosis Date  . Hypertension   . Obesity   . Psoriasis   . Family history of ischemic heart disease   . GERD (gastroesophageal reflux disease)     History reviewed. No pertinent past surgical history.  Family History  Problem Relation Age of Onset  . Heart disease Father   . Hypertension Father   . Heart failure Father     History  Substance Use Topics  . Smoking status: Never Smoker   . Smokeless tobacco: Never Used  . Alcohol Use: 1.5 oz/week    3 drink(s) per week      Review of Systems All other review systems are negative Allergies  Review of patient's allergies indicates no known allergies.  Home Medications   Current Outpatient Rx  Name Route Sig Dispense Refill  . AMLODIPINE BESYLATE 10 MG PO TABS Oral Take 10 mg by mouth daily.    . ASPIRIN 81 MG PO TABS Oral Take 81 mg by mouth daily.      Marland Kitchen HYDROCHLOROTHIAZIDE 25 MG PO TABS Oral Take 25 mg by mouth daily.    . ADULT MULTIVITAMIN W/MINERALS CH Oral Take 1 tablet by mouth daily.    Marland Kitchen NIZATIDINE 150 MG PO CAPS Oral Take 150 mg by mouth daily.    Marland Kitchen FISH OIL PO Oral Take 1 tablet by mouth daily.    Marland Kitchen POTASSIUM CHLORIDE CRYS ER 20 MEQ PO TBCR Oral Take 1 tablet (20 mEq total) by mouth daily. 30 tablet 12  . IBUPROFEN 600 MG PO TABS Oral  Take 1 tablet (600 mg total) by mouth every 6 (six) hours as needed for pain. 30 tablet 0    BP 131/86  Pulse 65  Temp 98.1 F (36.7 C) (Oral)  Resp 18  SpO2 100%  Physical Exam  Nursing note and vitals reviewed. Constitutional: He is oriented to person, place, and time. He appears well-developed and well-nourished. No distress.  HENT:  Head: Normocephalic and atraumatic.  Eyes: Pupils are equal, round, and reactive to light.  Neck: Normal range of motion.  Cardiovascular: Normal rate and intact distal pulses.   Pulmonary/Chest: No respiratory distress.  Abdominal: Normal appearance. He exhibits no distension.  Musculoskeletal: Normal range of motion.       Right lower leg: He exhibits no swelling.       Left lower leg: He exhibits no swelling.       Legs: Neurological: He is alert and oriented to person, place, and time. No cranial nerve deficit.  Skin: Skin is warm and dry. No rash noted.  Psychiatric: He has a normal mood and affect. His behavior is normal.    ED Course  Procedures (including critical care time)  Labs Reviewed - No data to display No results found.  1. Superficial phlebitis       MDM  Discussed well score and negative d-dimer with patient.  He understands that there is a small possibility he could have a deep venous thrombosis. He wants to try treatment at home before coming back for an venous Doppler.  Will switch him to 600 mg of  ibuprofen        Nelia Shi, MD 03/06/12 [phone]

## 2012-03-05 NOTE — ED Notes (Signed)
Pt c/o burning sensation in Bilat Upper legs onset 6 days go associated with tingling.  Denies numbness, swelling.  Saw PA at [facility] office and was diagnosed with Thrombophebitis.  Rx;'d with ASA and taking meds as Rx'd.  Denies SOB CP

## 2012-03-12 ENCOUNTER — Other Ambulatory Visit: Payer: Self-pay | Admitting: Family Medicine

## 2012-03-13 ENCOUNTER — Other Ambulatory Visit: Payer: Self-pay | Admitting: Medical

## 2012-03-13 ENCOUNTER — Telehealth: Payer: Self-pay | Admitting: Family Medicine

## 2012-03-13 ENCOUNTER — Other Ambulatory Visit: Payer: Self-pay | Admitting: Family Medicine

## 2012-03-13 DIAGNOSIS — I803 Phlebitis and thrombophlebitis of lower extremities, unspecified: Secondary | ICD-10-CM

## 2012-03-13 DIAGNOSIS — M79604 Pain in right leg: Secondary | ICD-10-CM

## 2012-03-13 NOTE — Telephone Encounter (Signed)
pls set up doppler ultrasound of right leg, orders in computer.

## 2012-03-13 NOTE — Telephone Encounter (Signed)
Pt called and stated that he is not any better. Pt would like a referral to vein specialist or wants a ultra sound. Please call pt.

## 2012-03-14 ENCOUNTER — Ambulatory Visit
Admission: RE | Admit: 2012-03-14 | Discharge: 2012-03-14 | Disposition: A | Discharge status: Home / Self Care | Payer: Managed Care, Other (non HMO) | Source: Ambulatory Visit | Attending: Medical | Admitting: Medical

## 2012-03-14 ENCOUNTER — Other Ambulatory Visit: Payer: Self-pay | Admitting: Family Medicine

## 2012-03-14 ENCOUNTER — Telehealth: Payer: Self-pay | Admitting: Family Medicine

## 2012-03-14 DIAGNOSIS — I809 Phlebitis and thrombophlebitis of unspecified site: Secondary | ICD-10-CM

## 2012-03-14 NOTE — Telephone Encounter (Signed)
PATIENT IS AWARE OF HIS APPOINTMENT AT [facility] ON 03/14/12 @ 400 PM. CLS

## 2012-04-28 ENCOUNTER — Ambulatory Visit (INDEPENDENT_AMBULATORY_CARE_PROVIDER_SITE_OTHER): Payer: Managed Care, Other (non HMO) | Admitting: Family Medicine

## 2012-04-28 ENCOUNTER — Encounter: Payer: Self-pay | Admitting: Family Medicine

## 2012-04-28 VITALS — BP 122/82 | HR 68 | Ht 70.5 in | Wt 245.0 lb

## 2012-04-28 DIAGNOSIS — L409 Psoriasis, unspecified: Secondary | ICD-10-CM

## 2012-04-28 DIAGNOSIS — Z0000 Encounter for general adult medical examination without abnormal findings: Secondary | ICD-10-CM

## 2012-04-28 DIAGNOSIS — I10 Essential (primary) hypertension: Secondary | ICD-10-CM

## 2012-04-28 DIAGNOSIS — L408 Other psoriasis: Secondary | ICD-10-CM

## 2012-04-28 DIAGNOSIS — I8390 Asymptomatic varicose veins of unspecified lower extremity: Secondary | ICD-10-CM

## 2012-04-28 DIAGNOSIS — E669 Obesity, unspecified: Secondary | ICD-10-CM

## 2012-04-28 DIAGNOSIS — K219 Gastro-esophageal reflux disease without esophagitis: Secondary | ICD-10-CM

## 2012-04-28 LAB — COMPREHENSIVE METABOLIC PANEL
ALT: 21 U/L (ref 0–53)
AST: 15 U/L (ref 0–37)
BUN: 13 mg/dL (ref 6–23)
Calcium: 9.6 mg/dL (ref 8.4–10.5)
Glucose, Bld: 108 mg/dL — ABNORMAL HIGH (ref 70–99)
Potassium: 3.9 mEq/L (ref 3.5–5.3)
Total Protein: 7.1 g/dL (ref 6.0–8.3)

## 2012-04-28 LAB — CBC WITH DIFFERENTIAL/PLATELET
Basophils Relative: 1 % (ref 0–1)
Eosinophils Absolute: 0.1 10*3/uL (ref 0.0–0.7)
HCT: 43.4 % (ref 39.0–52.0)
Hemoglobin: 15.5 g/dL (ref 13.0–17.0)
MCH: 30.2 pg (ref 26.0–34.0)
MCHC: 35.7 g/dL (ref 30.0–36.0)
Monocytes Absolute: 0.4 10*3/uL (ref 0.1–1.0)
Monocytes Relative: 6 % (ref 3–12)
Neutro Abs: 3.2 10*3/uL (ref 1.7–7.7)

## 2012-04-28 LAB — POCT URINALYSIS DIPSTICK
Ketones, UA: NEGATIVE
Protein, UA: NEGATIVE
Spec Grav, UA: 1.015
pH, UA: 6

## 2012-04-28 LAB — LIPID PANEL
Cholesterol: 189 mg/dL (ref 0–200)
Triglycerides: 128 mg/dL (ref <150)

## 2012-04-28 LAB — HEMOCCULT GUIAC POC 1CARD (OFFICE)

## 2012-04-28 MED ORDER — AMLODIPINE BESYLATE 10 MG PO TABS: 10.0000 mg | ORAL_TABLET | Freq: Every day | ORAL | Status: DC

## 2012-04-28 MED ORDER — HYDROCHLOROTHIAZIDE 25 MG PO TABS: 25.0000 mg | ORAL_TABLET | Freq: Every day | ORAL | Status: DC

## 2012-04-28 MED ORDER — NIZATIDINE 150 MG PO CAPS: 150.0000 mg | ORAL_CAPSULE | Freq: Every day | ORAL | Status: DC

## 2012-04-28 NOTE — Progress Notes (Signed)
Subjective:    Patient ID: Randy [name], male    DOB: October 29, 1965, 46 y.o.   MRN: [id number]  HPI He is here for complete examination. He has had difficulty recently with some swelling in the superficial veins in his legs. He has had an evaluation for this which turned out to be negative. He does have difficulty with psoriasis and takes good care of this. He does have reflux disease and responds quite nicely to the accident. He is sedentary with his job which he states interferes with his physical activities.   Review of Systems  Constitutional: Negative.   HENT: Negative.   Eyes: Negative.   Cardiovascular: Negative.   Gastrointestinal: Negative.   Genitourinary: Negative.   Musculoskeletal: Negative.   Skin: Negative.   Neurological: Negative.   Hematological: Negative.   Psychiatric/Behavioral: Negative.        Objective:   Physical Exam BP 122/82  Pulse 68  Ht 5' 10.5" (1.791 m)  Wt 245 lb (111.131 kg)  BMI 34.66 kg/m2  SpO2 98%  General Appearance:    Alert, cooperative, no distress, appears stated age  Head:    Normocephalic, without obvious abnormality, atraumatic  Eyes:    PERRL, conjunctiva/corneas clear, EOM's intact, fundi    benign  Ears:    Normal TM's and external ear canals  Nose:   Nares normal, mucosa normal, no drainage or sinus   tenderness  Throat:   Lips, mucosa, and tongue normal; teeth and gums normal  Neck:   Supple, no lymphadenopathy;  thyroid:  no   enlargement/tenderness/nodules; no carotid   bruit or JVD  Back:    Spine nontender, no curvature, ROM normal, no CVA     tenderness  Lungs:     Clear to auscultation bilaterally without wheezes, rales or     ronchi; respirations unlabored  Chest Wall:    No tenderness or deformity   Heart:    Regular rate and rhythm, S1 and S2 normal, no murmur, rub   or gallop  Breast Exam:    No chest wall tenderness, masses or gynecomastia  Abdomen:     Soft, non-tender, nondistended, normoactive bowel  sounds,    no masses, no hepatosplenomegaly  Genitalia:    Normal male external genitalia without lesions.  Testicles without masses.  No inguinal hernias.  Rectal:    Normal sphincter tone, no masses or tenderness; guaiac negative stool.  Prostate smooth, no nodules, not enlarged.  Extremities:   No clubbing, cyanosis or edema  Pulses:   2+ and symmetric all extremities  Skin:   Skin color, texture, turgor normal, no rashes or lesions  Lymph nodes:   Cervical, supraclavicular, and axillary nodes normal  Neurologic:   CNII-XII intact, normal strength, sensation and gait; reflexes 2+ and symmetric throughout          Psych:   Normal mood, affect, hygiene and grooming.         Assessment & Plan:   1. Routine general medical examination at a health care facility  Hemoccult - 1 Card (office), Lipid panel, CBC with Differential, Comprehensive metabolic panel  2. HYPERTENSION  POCT Urinalysis Dipstick, hydrochlorothiazide (HYDRODIURIL) 25 MG tablet, amLODipine (NORVASC) 10 MG tablet  3. Psoriasis    4. GERD (gastroesophageal reflux disease)  nizatidine (AXID) 150 MG capsule  5. Varicose veins    6. Obesity (BMI 30-39.9)     discuss varicose veins with him in regard to further treatment especially if they become more symptomatic. Reassured  him that these were not dangerous in terms of causing pulmonary emboli. Recommend he increase his physical activity even if this 10 minute walks with the breaks that he gets at work. Also recommend he use the Axid on an as-needed basis. His medications were renewed.

## 2012-05-01 NOTE — Progress Notes (Signed)
Quick Note:  PT INFORMED VERBALIZED UNDERSTANDING ALSO FAXED HIS INS. FORM ______

## 2012-06-19 ENCOUNTER — Other Ambulatory Visit: Payer: Self-pay | Admitting: Family Medicine

## 2012-08-23 ENCOUNTER — Telehealth: Payer: Self-pay | Admitting: Family Medicine

## 2012-08-23 NOTE — Telephone Encounter (Signed)
Please call, patient wants referral to specialist concerning veins in his legs and pain

## 2012-08-24 NOTE — Telephone Encounter (Signed)
FAXED REFERRAL TO [facility]

## 2012-08-24 NOTE — Telephone Encounter (Signed)
Okay to refer? 

## 2012-09-22 ENCOUNTER — Telehealth: Payer: Self-pay | Admitting: Internal Medicine

## 2012-09-22 ENCOUNTER — Telehealth: Payer: Self-pay | Admitting: Cardiology

## 2012-09-22 NOTE — Telephone Encounter (Signed)
Spoke with pt. He reports pain in legs that occurs daily. He is able to work out on treadmill but after working out his feet and legs ache and tingle. Also reports swelling of veins in legs. This has been going on for several months. He has seen his primary MD about this. He saw vein specialist recently and had testing done. Was told he had no clot but that valves were not working properly. He has read about PAD and is asking if he could be tested for this.  I have asked him to have doppler studies done recently faxed to our office. Venous dopplers are in chart but pt does not know if he had arterial doppler studies done.  He is very concerned about this and would like Dr. Ludwig Clarks recommendations.

## 2012-09-22 NOTE — Telephone Encounter (Signed)
Pt would like to have his lipids recheck since his LDL was high back in December and he is having leg pain and hes been to a specialist and hes looked information up and seen that leg pain could be due to high cholesterol and he would like it rechecked. Pt knows you will be out until next week

## 2012-09-22 NOTE — Telephone Encounter (Signed)
New problem   Pt is having swelling in leg veins, feet, both legs, calf and thigh. Pt is also in pain all the time. Please call pt.

## 2012-09-23 NOTE — Telephone Encounter (Signed)
Does not sound like PVD but can check ABI Randy [name]

## 2012-09-23 NOTE — Telephone Encounter (Signed)
Schedule him for an appt to discuss this

## 2012-09-24 ENCOUNTER — Other Ambulatory Visit: Payer: Self-pay | Admitting: Family Medicine

## 2012-09-25 ENCOUNTER — Telehealth: Payer: Self-pay | Admitting: *Deleted

## 2012-09-25 ENCOUNTER — Telehealth: Payer: Self-pay | Admitting: Cardiology

## 2012-09-25 NOTE — Telephone Encounter (Signed)
Left message for patient to call and schedule fasting appointment with Dr.Lalonde next week when he returns.

## 2012-09-25 NOTE — Telephone Encounter (Signed)
Spoke with pt, LEA's scheduled 

## 2012-09-25 NOTE — Telephone Encounter (Signed)
Spoke with pt, questions answered.

## 2012-09-25 NOTE — Telephone Encounter (Signed)
New problem ° ° °Pt returning your call. Please call pt. °

## 2012-09-26 ENCOUNTER — Encounter (INDEPENDENT_AMBULATORY_CARE_PROVIDER_SITE_OTHER): Payer: Managed Care, Other (non HMO)

## 2012-09-26 DIAGNOSIS — I739 Peripheral vascular disease, unspecified: Secondary | ICD-10-CM

## 2012-10-05 ENCOUNTER — Telehealth: Payer: Self-pay | Admitting: Cardiology

## 2012-10-05 NOTE — Telephone Encounter (Signed)
Spoke with pt, aware of doppler results 

## 2012-10-05 NOTE — Telephone Encounter (Signed)
New Problem: ° ° ° °Patient called in returning your call regarding his Carotid results.  Please call back. °

## 2012-10-05 NOTE — Patient Instructions (Signed)
Spoke with pt, aware of doppler results 

## 2012-10-17 ENCOUNTER — Ambulatory Visit: Payer: Managed Care, Other (non HMO) | Admitting: Physician Assistant

## 2012-10-25 ENCOUNTER — Encounter: Payer: Self-pay | Admitting: Cardiology

## 2012-12-05 ENCOUNTER — Other Ambulatory Visit: Payer: Self-pay | Admitting: Medical

## 2012-12-05 NOTE — Telephone Encounter (Signed)
rx refill request

## 2012-12-05 NOTE — Telephone Encounter (Signed)
Is this ok?

## 2012-12-28 ENCOUNTER — Telehealth: Payer: Self-pay | Admitting: Cardiology

## 2012-12-28 NOTE — Telephone Encounter (Signed)
Spoke with pt, in the last year he has been having a lot of trouble with pain and burning in his legs. He has seen dr Guss Bunde at [facility]. He would like for [place] to get his records because he would like dr crenshaw's option. Will get the records and review with dr Jens Som

## 2012-12-28 NOTE — Telephone Encounter (Signed)
New Prob     Pt has questions regarding some records Dr. Jens Som had requested from him.

## 2013-01-01 ENCOUNTER — Ambulatory Visit: Payer: Managed Care, Other (non HMO) | Admitting: Cardiology

## 2013-01-02 ENCOUNTER — Ambulatory Visit: Payer: Managed Care, Other (non HMO) | Admitting: Cardiology

## 2013-01-02 ENCOUNTER — Telehealth: Payer: Self-pay | Admitting: Cardiology

## 2013-01-02 NOTE — Telephone Encounter (Signed)
Records rec From Dr.Featherstone gave to Victorio Palm 01/02/13/KM

## 2013-01-16 ENCOUNTER — Telehealth: Payer: Self-pay | Admitting: Cardiology

## 2013-01-16 NOTE — Telephone Encounter (Signed)
New Problem  Pt calling to check on records to see if they were received for his up coming appointment// wants to be sure everything is in order before his arrival.

## 2013-01-16 NOTE — Telephone Encounter (Signed)
Spoke with pt, aware notes from dr Ardyth Gal are scanned into the system.

## 2013-01-18 ENCOUNTER — Encounter: Payer: Self-pay | Admitting: Cardiology

## 2013-01-18 ENCOUNTER — Ambulatory Visit (INDEPENDENT_AMBULATORY_CARE_PROVIDER_SITE_OTHER): Payer: Managed Care, Other (non HMO) | Admitting: Cardiology

## 2013-01-18 VITALS — BP 130/80 | HR 69 | Wt 159.0 lb

## 2013-01-18 DIAGNOSIS — R9431 Abnormal electrocardiogram [ECG] [EKG]: Secondary | ICD-10-CM

## 2013-01-18 DIAGNOSIS — I872 Venous insufficiency (chronic) (peripheral): Secondary | ICD-10-CM

## 2013-01-18 DIAGNOSIS — E785 Hyperlipidemia, unspecified: Secondary | ICD-10-CM

## 2013-01-18 DIAGNOSIS — I10 Essential (primary) hypertension: Secondary | ICD-10-CM

## 2013-01-18 NOTE — Assessment & Plan Note (Signed)
Management per primary care. 

## 2013-01-18 NOTE — Patient Instructions (Addendum)
Your physician recommends that you schedule a follow-up appointment in: AS NEEDED WITH DR CRENSHAW  REFERRAL TO DR Kirke Corin FOR VENOUS INSUFF

## 2013-01-18 NOTE — Progress Notes (Signed)
   HPI: FU chest pain and hypertension. Stress echocardiogram in December of 2011 normal. Venous Dopplers in April of 2014 showed venous insufficiency.Lower extremity arterial dopplers in May 2014 normal. I last saw him in August of 2013. Since then, he denies dyspnea, chest pain, palpitations or syncope. He has developed edema and pain in his thighs bilaterally. He has varicosities that have caused a burning sensation. He has been told he needs venous ligation.   Current Outpatient Prescriptions  Medication Sig Dispense Refill  . amLODipine (NORVASC) 10 MG tablet Take 1 tablet (10 mg total) by mouth daily.  90 tablet  3  . aspirin 81 MG tablet Take 81 mg by mouth daily.        . hydrochlorothiazide (HYDRODIURIL) 25 MG tablet Take 1 tablet (25 mg total) by mouth daily.  90 tablet  3  . Multiple Vitamin (MULTIVITAMIN WITH MINERALS) TABS Take 1 tablet by mouth daily.      . nizatidine (AXID) 150 MG capsule TAKE 1 CAPSULE (150 MG TOTAL) BY MOUTH DAILY.  90 capsule  3  . Omega-3 Fatty Acids (FISH OIL PO) Take 1 tablet by mouth daily.      . potassium chloride SA (K-DUR,KLOR-CON) 20 MEQ tablet Take 1 tablet (20 mEq total) by mouth daily.  30 tablet  12   No current facility-administered medications for this visit.     Past Medical History  Diagnosis Date  . Hypertension   . Obesity   . Psoriasis   . Family history of ischemic heart disease   . GERD (gastroesophageal reflux disease)     History reviewed. No pertinent past surgical history.  History   Social History  . Marital Status: Married    Spouse Name: N/A    Number of Children: N/A  . Years of Education: N/A   Occupational History  . Not on file.   Social History Main Topics  . Smoking status: Never Smoker   . Smokeless tobacco: Never Used  . Alcohol Use: 1.5 oz/week    3 drink(s) per week  . Drug Use: No  . Sexual Activity: Yes   Other Topics Concern  . Not on file   Social History Narrative  . No narrative on  file    ROS: no fevers or chills, productive cough, hemoptysis, dysphasia, odynophagia, melena, hematochezia, dysuria, hematuria, rash, seizure activity, orthopnea, PND, pedal edema, claudication. Remaining systems are negative.  Physical Exam: Well-developed well-nourished in no acute distress.  Skin is warm and dry.  HEENT is normal.  Neck is supple.  Chest is clear to auscultation with normal expansion.  Cardiovascular exam is regular rate and rhythm.  Abdominal exam nontender or distended. No masses palpated. Extremities show varicosities in the medial thighs bilaterally neuro grossly intact  ECG sinus rhythm at a rate of 71. First degree AV block. No ST changes.

## 2013-01-18 NOTE — Assessment & Plan Note (Signed)
Patient has developed varicosities in his lower extremities. These are noted in the medial aspect of his thighs bilaterally. He has pain with these and has been to see a physician who has recommended ligation. He would like a second opinion about this. I will arrange for evaluation with Dr. Kirke Corin. Note arterial Dopplers were normal and he is not having claudication.

## 2013-01-18 NOTE — Assessment & Plan Note (Signed)
Blood pressure controlled. Continue present medications. 

## 2013-01-22 ENCOUNTER — Encounter: Payer: Self-pay | Admitting: *Deleted

## 2013-01-23 ENCOUNTER — Ambulatory Visit (INDEPENDENT_AMBULATORY_CARE_PROVIDER_SITE_OTHER): Payer: Managed Care, Other (non HMO) | Admitting: Cardiovascular Disease

## 2013-01-23 ENCOUNTER — Encounter: Payer: Self-pay | Admitting: Cardiovascular Disease

## 2013-01-23 VITALS — HR 78 | Ht 70.5 in | Wt 227.0 lb

## 2013-01-23 DIAGNOSIS — I872 Venous insufficiency (chronic) (peripheral): Secondary | ICD-10-CM

## 2013-01-23 NOTE — Patient Instructions (Addendum)
Your physician recommends that you continue on your current medications as directed. Please refer to the Current Medication list given to you today.  Your physician recommends that you schedule a follow-up appointment in: as needed  

## 2013-01-23 NOTE — Progress Notes (Signed)
    HPI: This is a 47 year old male who was referred by Dr. Jens Som for evaluation of chronic venous insufficieny. He has known history of hypertension on amlodipine and hydrochlorothiazide.  Venous Dopplers in April of 2014 at [facility] showed venous insufficiency worse on the left side.Lower extremity arterial dopplers in May 2014 normal. He has 2 prominent veins in both sides in the medial aspect. He complains of pain at night in the same area when he is trying to go to sleep. It is also my no extremity edema below the knee. He started using support stockings with some improvement. Occasionally there is discomfort that requires ibuprofen. He gets occasional flareups with superficial phlebitis. The effected area is overall small. 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  Current Outpatient Prescriptions  Medication Sig Dispense Refill  . amLODipine (NORVASC) 10 MG tablet Take 1 tablet (10 mg total) by mouth daily.  90 tablet  3  . aspirin 81 MG tablet Take 81 mg by mouth daily.        . hydrochlorothiazide (HYDRODIURIL) 25 MG tablet Take 1 tablet (25 mg total) by mouth daily.  90 tablet  3  . Multiple Vitamin (MULTIVITAMIN WITH MINERALS) TABS Take 1 tablet by mouth daily.      . nizatidine (AXID) 150 MG capsule TAKE 1 CAPSULE (150 MG TOTAL) BY MOUTH DAILY.  90 capsule  3  . Omega-3 Fatty Acids (FISH OIL PO) Take 1 tablet by mouth daily.      . potassium chloride SA (K-DUR,KLOR-CON) 20 MEQ tablet Take 1 tablet (20 mEq total) by mouth daily.  30 tablet  12   No current facility-administered medications for this visit.     Past Medical History  Diagnosis Date  . Hypertension   . Obesity   . Psoriasis   . Family history of ischemic heart disease   . GERD (gastroesophageal reflux disease)     Past Surgical History  Procedure Laterality Date  . Toe surgery      right great toe  . Cystectomy      left chest    History   Social History  . Marital  Status: Married    Spouse Name: N/A    Number of Children: N/A  . Years of Education: N/A   Occupational History  . Not on file.   Social History Main Topics  . Smoking status: Never Smoker   . Smokeless tobacco: Never Used  . Alcohol Use: 1.5 oz/week    3 drink(s) per week  . Drug Use: No  . Sexual Activity: Yes   Other Topics Concern  . Not on file   Social History Narrative  . No narrative on file    ROS: no fevers or chills, productive cough, hemoptysis, dysphasia, odynophagia, melena, hematochezia, dysuria, hematuria, rash, seizure activity, orthopnea, PND, pedal edema, claudication. Remaining systems are negative.  Physical Exam: Well-developed well-nourished in no acute distress.  Skin is warm and dry.  HEENT is normal.  Neck is supple.  Chest is clear to auscultation with normal expansion.  Cardiovascular exam is regular rate and rhythm.  Abdominal exam nontender or distended. No masses palpated. Extremities show mild varicosities in the medial thighs bilaterally. There is trace lower extremity edema.  neuro grossly intact

## 2013-01-23 NOTE — Assessment & Plan Note (Signed)
I had a prolonged discussion with him about the pathophysiology, natural history and management of chronic venous insufficiency. I explained that management is tailored according to the severity of his symptoms. Initial conservative approach is recommended for at least 6 months with lifestyle changes including leg exercise, avoiding sitting and standing in one position for a long time, support stockings and frequent leg elevation during the day. If symptoms do not improve with this or worsen, he can consider venous ablation therapy which currently has a high success rate with low complications.  There is also the possibility of losing future venous conduits for possible bypasses. However, he is not diabetic and does not smoke. Thus, the chance of needing bypass is overall small.

## 2013-02-05 ENCOUNTER — Other Ambulatory Visit: Payer: Self-pay | Admitting: Cardiology

## 2013-03-20 ENCOUNTER — Other Ambulatory Visit: Payer: Self-pay | Admitting: Family Medicine

## 2013-03-22 ENCOUNTER — Other Ambulatory Visit: Payer: Self-pay

## 2013-03-23 ENCOUNTER — Other Ambulatory Visit: Payer: Self-pay | Admitting: Family Medicine

## 2013-04-10 ENCOUNTER — Ambulatory Visit (INDEPENDENT_AMBULATORY_CARE_PROVIDER_SITE_OTHER): Payer: Managed Care, Other (non HMO) | Admitting: Medical

## 2013-04-10 ENCOUNTER — Encounter: Payer: Self-pay | Admitting: Medical

## 2013-04-10 VITALS — BP 112/80 | HR 56 | Temp 97.9°F | Resp 16 | Wt 225.0 lb

## 2013-04-10 DIAGNOSIS — S90129A Contusion of unspecified lesser toe(s) without damage to nail, initial encounter: Secondary | ICD-10-CM

## 2013-04-10 DIAGNOSIS — S90222A Contusion of left lesser toe(s) with damage to nail, initial encounter: Secondary | ICD-10-CM

## 2013-04-10 NOTE — Progress Notes (Signed)
Subjective: Here for toenail concern.  He reports dropping a board on his foot while wearing a shoe on 03/24/2013.  After a few hours had purplish coloration under the left great toenail.  It has remained unchanged.  It hurt at first, particularly with pressure, but it currently does not cause pain or pressure.   The toe has not had redness, swelling, no drainage, no fever.  No numbness, no tingling or weakness.  No other aggravating or relieving factors. No other c/o.  ROS as in subjective  Objective: Gen: wd, wn, nad Skin: left great toenail with purplish coloration under nail.  No erythema, no drainage MSK: left great toe nontender, normal ROM Neuro normal sensation and strength of toe Pulses and cap refill normal of toe/foot  Assessment: Encounter Diagnosis  Name Primary?  . Subungual hematoma of foot, left, initial encounter Yes   Plan: Discussed the diagnosis, usual course of injury and resolution.  Gave the option of paperclip trephination, but given the fact he has no pain, no discomfort and it has been over 2 weeks since injury, the yield of doing the proceed would likely be limited.  Thus, he agreed to leave the toe alone and allow it to resolve gradually.  Advised he recheck if redness, swelling, fever, or pain.  F/u otherwise prn.

## 2013-04-22 ENCOUNTER — Other Ambulatory Visit: Payer: Self-pay | Admitting: Family Medicine

## 2013-04-27 ENCOUNTER — Ambulatory Visit (INDEPENDENT_AMBULATORY_CARE_PROVIDER_SITE_OTHER): Payer: Managed Care, Other (non HMO) | Admitting: Family Medicine

## 2013-04-27 ENCOUNTER — Encounter: Payer: Self-pay | Admitting: Family Medicine

## 2013-04-27 VITALS — BP 116/70 | HR 74 | Ht 70.5 in | Wt 227.0 lb

## 2013-04-27 DIAGNOSIS — E785 Hyperlipidemia, unspecified: Secondary | ICD-10-CM

## 2013-04-27 DIAGNOSIS — Z0000 Encounter for general adult medical examination without abnormal findings: Secondary | ICD-10-CM

## 2013-04-27 DIAGNOSIS — E669 Obesity, unspecified: Secondary | ICD-10-CM

## 2013-04-27 DIAGNOSIS — I10 Essential (primary) hypertension: Secondary | ICD-10-CM

## 2013-04-27 DIAGNOSIS — I872 Venous insufficiency (chronic) (peripheral): Secondary | ICD-10-CM

## 2013-04-27 DIAGNOSIS — K219 Gastro-esophageal reflux disease without esophagitis: Secondary | ICD-10-CM

## 2013-04-27 DIAGNOSIS — Z8249 Family history of ischemic heart disease and other diseases of the circulatory system: Secondary | ICD-10-CM

## 2013-04-27 LAB — CBC WITH DIFFERENTIAL/PLATELET
Eosinophils Relative: 0 % (ref 0–5)
HCT: 43.8 % (ref 39.0–52.0)
Hemoglobin: 15.2 g/dL (ref 13.0–17.0)
Lymphocytes Relative: 22 % (ref 12–46)
Lymphs Abs: 2 10*3/uL (ref 0.7–4.0)
MCV: 88.1 fL (ref 78.0–100.0)
Monocytes Relative: 6 % (ref 3–12)
Neutrophils Relative %: 72 % (ref 43–77)
Platelets: 283 10*3/uL (ref 150–400)
RBC: 4.97 MIL/uL (ref 4.22–5.81)
WBC: 9 10*3/uL (ref 4.0–10.5)

## 2013-04-27 LAB — COMPREHENSIVE METABOLIC PANEL
ALT: 23 U/L (ref 0–53)
BUN: 15 mg/dL (ref 6–23)
CO2: 29 mEq/L (ref 19–32)
Calcium: 9.7 mg/dL (ref 8.4–10.5)
Chloride: 102 mEq/L (ref 96–112)
Creat: 1.18 mg/dL (ref 0.50–1.35)
Sodium: 141 mEq/L (ref 135–145)
Total Protein: 7.1 g/dL (ref 6.0–8.3)

## 2013-04-27 LAB — POCT URINALYSIS DIPSTICK
Bilirubin, UA: NEGATIVE
Glucose, UA: NEGATIVE
Ketones, UA: NEGATIVE
Leukocytes, UA: NEGATIVE
Spec Grav, UA: <=1.005
Urobilinogen, UA: NEGATIVE

## 2013-04-27 LAB — LIPID PANEL
Cholesterol: 178 mg/dL (ref 0–200)
HDL: 47 mg/dL (ref >39)
Total CHOL/HDL Ratio: 3.8 Ratio
VLDL: 17 mg/dL (ref 0–40)

## 2013-04-27 MED ORDER — POTASSIUM CHLORIDE CRYS ER 20 MEQ PO TBCR: EXTENDED_RELEASE_TABLET | ORAL | Status: DC

## 2013-04-27 MED ORDER — AMLODIPINE BESYLATE 10 MG PO TABS: 10.0000 mg | ORAL_TABLET | Freq: Every day | ORAL | Status: DC

## 2013-04-27 MED ORDER — HYDROCHLOROTHIAZIDE 25 MG PO TABS: 25.0000 mg | ORAL_TABLET | Freq: Every day | ORAL | Status: DC

## 2013-04-27 NOTE — Progress Notes (Signed)
Teaching Physician: Sharlot Gowda, MD Dictated By: Judithann Graves  Subjective:  Randy [name] is a 47 y.o. male who presents for his annual complete physical. No acute concerns today.  He has a history of hypertension and his blood pressures have tended to be approx 120/85. He has a history of venous insufficiency and has consulted a vein specialist in August of this year - their recommendation was that he could electively pursue laser sclerotherapy at a time convenient for him. He is still deliberating. He has had no acute fever, LE swelling, pain. Has a history of GERD for which he takes nizatidine. This is under good control with no reflux or heartburn, midepigastric pain, or cough lately. Has intentionally lost 20 pounds since last physical - exercises 2-3 times per week, jogging 3 miles and lifting weights and using elliptical, cut out sodas completely. He did see his cardiologist in September. He does have a history of first degree AV block.  Family and social histories were reviewed.    ROS negative except as in subjective.  Objective:  Physical Exam: BP 116/70  Pulse 74  Ht 5' 10.5" (1.791 m)  Wt 227 lb (102.967 kg)  BMI 32.10 kg/m2  SpO2 98%  General Appearance:    Alert, cooperative, no distress, appears stated age  Head:    Normocephalic, without obvious abnormality, atraumatic  Eyes:    PERRL, conjunctiva/corneas clear, EOM's intact, fundi    benign  Ears:    Normal TM's and external ear canals  Nose:   Nares normal, mucosa normal, no drainage or sinus   tenderness  Throat:   Lips, mucosa, and tongue normal; teeth and gums normal  Neck:   Supple, no lymphadenopathy;  thyroid:  no   enlargement/tenderness/nodules; no carotid   bruit or JVD  Back:    Spine nontender, no curvature, ROM normal, no CVA     tenderness  Lungs:     Clear to auscultation bilaterally without wheezes, rales or     ronchi; respirations unlabored  Chest Wall:    No tenderness or deformity   Heart:     Regular rate and rhythm, S1 and S2 normal, no murmur, rub   or gallop  Breast Exam:    No chest wall tenderness, masses or gynecomastia  Abdomen:     Soft, non-tender, nondistended, normoactive bowel sounds,    no masses, no hepatosplenomegaly  Extremities:   No clubbing, cyanosis or edema  Pulses:   2+ and symmetric all extremities  Skin:   Skin color, texture, turgor normal, no rashes or lesions  Lymph nodes:   Cervical, supraclavicular, and axillary nodes normal  Neurologic:   CNII-XII intact, normal strength, sensation and gait; reflexes 2+ and symmetric throughout          Psych:   Normal mood, affect, hygiene and grooming.     Assessment and Plan: 1. HYPERTENSION - POCT Urinalysis Dipstick - amLODipine (NORVASC) 10 MG tablet; Take 1 tablet (10 mg total) by mouth daily.  Dispense: 90 tablet; Refill: 3 - hydrochlorothiazide (HYDRODIURIL) 25 MG tablet; Take 1 tablet (25 mg total) by mouth daily.  Dispense: 90 tablet; Refill: 3 - potassium chloride SA (K-DUR,KLOR-CON) 20 MEQ tablet; TAKE 1 TABLET (20 MEQ TOTAL) BY MOUTH DAILY.  Dispense: 90 tablet; Refill: 3  2. Venous insufficiency Considering pursuing laser sclerotherapy. Will follow up with his vein specialist for this.  3. HYPERLIPIDEMIA - Will check Lipid panel today  4. Obesity (BMI 30-39.9) Has implemented lifestyle measures  to successfully lose 20 pounds since last physical. Encouraged to continue these measures.  5. Family history of heart disease in male family member before age 58 - Will check Lipid panel today.  6. GERD Not presently symptomatic, continuing on his nazitidine.  7. Routine general medical examination at a health care facilit - amLODipine (NORVASC) 10 MG tablet; Take 1 tablet (10 mg total) by mouth daily.  Dispense: 90 tablet; Refill: 3 - hydrochlorothiazide (HYDRODIURIL) 25 MG tablet; Take 1 tablet (25 mg total) by mouth daily.  Dispense: 90 tablet; Refill: 3 - potassium chloride SA (K-DUR,KLOR-CON)  20 MEQ tablet; TAKE 1 TABLET (20 MEQ TOTAL) BY MOUTH DAILY.  Dispense: 90 tablet; Refill: 3 - CBC with Differential - Comprehensive metabolic panel - Lipid panel Did mention the possibility of him switching to a different potassium supplement.  Dr. Susann Givens was present for the encounter and agrees with the above assessment and plan.

## 2013-05-23 ENCOUNTER — Telehealth: Payer: Self-pay | Admitting: Cardiology

## 2013-05-23 NOTE — Telephone Encounter (Signed)
Pt states from the last 2 weeks he has noticed that his heart rate increases to a higher rate than usual when exercising . Pt states his rate will state up for a time even when slowing down, and he has a sensation of something is rolling up from the base of his stomach to the center of his chest, a sensation that pt can't described. These symptoms makes pt very nervous. Pt would like to be seen soon. An appointment was made with Dr. Stanford Breed for 05/28/13 at 12:00 noon. Pt aware.

## 2013-05-23 NOTE — Telephone Encounter (Signed)
New Message  Pt called states that over the past 2 weeks while exercising his heart rate increases over 130. He states that he is concerned with the fact that it stays that high after 2-3 minutes when he believes that his heart rate is to decrease within moments after he has stopped exercising.. Please call to discuss

## 2013-05-28 ENCOUNTER — Ambulatory Visit (INDEPENDENT_AMBULATORY_CARE_PROVIDER_SITE_OTHER): Payer: Managed Care, Other (non HMO) | Admitting: Cardiology

## 2013-05-28 ENCOUNTER — Encounter: Payer: Self-pay | Admitting: *Deleted

## 2013-05-28 ENCOUNTER — Encounter: Payer: Self-pay | Admitting: Cardiology

## 2013-05-28 VITALS — BP 144/80 | HR 68 | Ht 70.5 in | Wt 225.0 lb

## 2013-05-28 DIAGNOSIS — I872 Venous insufficiency (chronic) (peripheral): Secondary | ICD-10-CM

## 2013-05-28 DIAGNOSIS — R079 Chest pain, unspecified: Secondary | ICD-10-CM

## 2013-05-28 DIAGNOSIS — I10 Essential (primary) hypertension: Secondary | ICD-10-CM

## 2013-05-28 NOTE — Assessment & Plan Note (Signed)
Patient continues to have problems with venous insufficiency and painful varicosities. He is planning ablation.

## 2013-05-28 NOTE — Assessment & Plan Note (Signed)
Continue present blood pressure medications. 

## 2013-05-28 NOTE — Assessment & Plan Note (Signed)
Symptoms atypical. Will arrange stress echocardiogram for risk stratification.

## 2013-05-28 NOTE — Patient Instructions (Signed)
Your physician wants you to follow-up in: [place] will receive a reminder letter in the mail two months in advance. If you don't receive a letter, please call our office to schedule the follow-up appointment.   Your physician has requested that you have a stress echocardiogram. For further information please visit [email or web address]. Please follow instruction sheet as given.

## 2013-05-28 NOTE — Progress Notes (Signed)
      HPI: FU chest pain and hypertension. Stress echocardiogram in December of 2011 normal. Venous Dopplers in April of 2014 showed venous insufficiency. Lower extremity arterial dopplers in May 2014 normal. Seen by Dr Fletcher Anon in Sept 2014 for venous insuff and initial conservative measures recommended. Venous ablation could be considered if remains symptomatic despite these measures. I last saw him in Sept 2014. Since then, there is no dyspnea on exertion. He has been exercising recently and twice noticed a vague pain in the epigastric area it resolved with rest. He also noticed that his heart rate increased more than normal. No orthopnea, PND or pedal edema. He has had more difficulties with venous insufficiency.   Current Outpatient Prescriptions  Medication Sig Dispense Refill  . amLODipine (NORVASC) 10 MG tablet Take 1 tablet (10 mg total) by mouth daily.  90 tablet  3  . aspirin 81 MG tablet Take 81 mg by mouth daily.        . hydrochlorothiazide (HYDRODIURIL) 25 MG tablet Take 1 tablet (25 mg total) by mouth daily.  90 tablet  3  . Multiple Vitamin (MULTIVITAMIN WITH MINERALS) TABS Take 1 tablet by mouth daily.      . nizatidine (AXID) 150 MG capsule TAKE 1 CAPSULE (150 MG TOTAL) BY MOUTH DAILY.  90 capsule  3  . Omega-3 Fatty Acids (FISH OIL PO) Take 1 tablet by mouth daily.      . potassium chloride SA (K-DUR,KLOR-CON) 20 MEQ tablet TAKE 1 TABLET (20 MEQ TOTAL) BY MOUTH DAILY.  90 tablet  3   No current facility-administered medications for this visit.     Past Medical History  Diagnosis Date  . Hypertension   . Obesity   . Psoriasis   . Family history of ischemic heart disease   . GERD (gastroesophageal reflux disease)     Past Surgical History  Procedure Laterality Date  . Toe surgery      right great toe  . Cystectomy      left chest    History   Social History  . Marital Status: Married    Spouse Name: N/A    Number of Children: N/A  . Years of Education: N/A     Occupational History  . Not on file.   Social History Main Topics  . Smoking status: Never Smoker   . Smokeless tobacco: Never Used  . Alcohol Use: 1.5 oz/week    3 drink(s) per week  . Drug Use: No  . Sexual Activity: Yes   Other Topics Concern  . Not on file   Social History Narrative  . No narrative on file    ROS: no fevers or chills, productive cough, hemoptysis, dysphasia, odynophagia, melena, hematochezia, dysuria, hematuria, rash, seizure activity, orthopnea, PND, pedal edema, claudication. Remaining systems are negative.  Physical Exam: Well-developed well-nourished in no acute distress.  Skin is warm and dry.  HEENT is normal.  Neck is supple.  Chest is clear to auscultation with normal expansion.  Cardiovascular exam is regular rate and rhythm.  Abdominal exam nontender or distended. No masses palpated. Extremities show no edema. neuro grossly intact  ECG sinus rhythm at a rate of 68, nonspecific ST changes.

## 2013-06-15 ENCOUNTER — Other Ambulatory Visit (HOSPITAL_COMMUNITY): Payer: Managed Care, Other (non HMO)

## 2013-06-22 ENCOUNTER — Encounter: Payer: Self-pay | Admitting: Cardiovascular Disease

## 2013-06-22 ENCOUNTER — Ambulatory Visit (HOSPITAL_COMMUNITY): Payer: Managed Care, Other (non HMO) | Attending: Cardiovascular Disease | Admitting: Cardiology

## 2013-06-22 ENCOUNTER — Ambulatory Visit (HOSPITAL_BASED_OUTPATIENT_CLINIC_OR_DEPARTMENT_OTHER): Payer: Managed Care, Other (non HMO)

## 2013-06-22 DIAGNOSIS — R0989 Other specified symptoms and signs involving the circulatory and respiratory systems: Secondary | ICD-10-CM

## 2013-06-22 DIAGNOSIS — R9431 Abnormal electrocardiogram [ECG] [EKG]: Secondary | ICD-10-CM

## 2013-06-22 DIAGNOSIS — R072 Precordial pain: Secondary | ICD-10-CM | POA: Insufficient documentation

## 2013-06-22 DIAGNOSIS — R079 Chest pain, unspecified: Secondary | ICD-10-CM

## 2013-06-22 DIAGNOSIS — I10 Essential (primary) hypertension: Secondary | ICD-10-CM | POA: Insufficient documentation

## 2013-06-22 DIAGNOSIS — Z6832 Body mass index (BMI) 32.0-32.9, adult: Secondary | ICD-10-CM | POA: Insufficient documentation

## 2013-06-22 DIAGNOSIS — E669 Obesity, unspecified: Secondary | ICD-10-CM | POA: Insufficient documentation

## 2013-06-22 NOTE — Progress Notes (Signed)
Stress echo performed.

## 2013-06-25 ENCOUNTER — Telehealth: Payer: Self-pay | Admitting: Cardiology

## 2013-06-25 ENCOUNTER — Telehealth: Payer: Self-pay | Admitting: *Deleted

## 2013-06-25 NOTE — Telephone Encounter (Signed)
Left message of normal stress test results on pts personal voicemail

## 2013-06-25 NOTE — Telephone Encounter (Signed)
New message ° ° ° ° °Want stress test results °

## 2013-06-25 NOTE — Telephone Encounter (Signed)
HR ok with exercise Kirk Ruths

## 2013-06-25 NOTE — Telephone Encounter (Signed)
Spoke with pt, he wonders if dr Stanford Breed is concerned about the elevated heart rate during the stress test. That is what caused him to come to see [place]. Heart rates from the stress test discussed with the pt. He wants to know what dr Stanford Breed thinks. Will forward for dr Stanford Breed review

## 2013-06-25 NOTE — Telephone Encounter (Signed)
Spoke with pt, Aware of dr crenshaw's recommendations.  °

## 2013-07-13 ENCOUNTER — Encounter: Payer: Self-pay | Admitting: Podiatrist

## 2013-07-13 ENCOUNTER — Ambulatory Visit (INDEPENDENT_AMBULATORY_CARE_PROVIDER_SITE_OTHER): Payer: Managed Care, Other (non HMO) | Admitting: Podiatrist

## 2013-07-13 VITALS — BP 167/84 | HR 82 | Resp 12

## 2013-07-13 DIAGNOSIS — S90129A Contusion of unspecified lesser toe(s) without damage to nail, initial encounter: Secondary | ICD-10-CM

## 2013-07-13 NOTE — Patient Instructions (Signed)
Call if the toenail becomes painful, red or swollen,  Otherwise the toenail should grow out normally

## 2013-07-13 NOTE — Progress Notes (Signed)
   Subjective:    Patient ID: Randy [name], male    DOB: [birth year], 48 y.o.   MRN: [id number]  HPI  '' RT FOOT GREAT TOENAIL IS BEEN LOOSE FOR 5 MONTHS BUT IT DOES NOT HURT. TRIED KEEP A BANDAGE ON IT TO KEEP IT COVERED.''  Patient relates an injury to the toenail and states it is not painful.  It has been growing out but has been loose.      Review of Systems  All other systems reviewed and are negative.       Objective:   Physical Exam GENERAL APPEARANCE: Alert, conversant. Appropriately groomed. No acute distress.  VASCULAR: Pedal pulses palpable at 2/4 DP and PT bilateral.  Capillary refill time is immediate to all digits,  Proximal to distal cooling it warm to warm.  Digital hair growth is present bilateral  NEUROLOGIC: sensation is intact epicritically and protectively to 5.07 monofilament at 5/5 sites bilateral.  Light touch is intact bilateral, vibratory sensation intact bilateral, achilles tendon reflex is intact bilateral.  MUSCULOSKELETAL: acceptable muscle strength, tone and stability bilateral.  Intrinsic muscluature intact bilateral.  Rectus appearance of foot and digits noted bilateral.   DERMATOLOGIC: nail plate is loose on the lateral portion of the toenail.  Only the medial 1/4 is attached.  The nail does not appear infected or irritated.  No cellulitus or drainage noted.      Assessment & Plan:  Contusion of toenail  Debrided the loose portion of the toenail without incidence.  Discussed signs and symptoms of infection and discussed the way in which the nail will grow out.  Patient will call if any signs of infection arise. Otherwise will be seen back prn.

## 2013-12-01 ENCOUNTER — Other Ambulatory Visit: Payer: Self-pay | Admitting: Family Medicine

## 2014-05-02 ENCOUNTER — Other Ambulatory Visit: Payer: Self-pay | Admitting: Family Medicine

## 2014-05-06 ENCOUNTER — Encounter: Payer: Managed Care, Other (non HMO) | Admitting: Family Medicine

## 2014-05-06 ENCOUNTER — Encounter: Payer: Self-pay | Admitting: Family Medicine

## 2014-05-06 ENCOUNTER — Ambulatory Visit (INDEPENDENT_AMBULATORY_CARE_PROVIDER_SITE_OTHER): Payer: Managed Care, Other (non HMO) | Admitting: Family Medicine

## 2014-05-06 VITALS — BP 128/90 | HR 72 | Ht 70.5 in | Wt 238.0 lb

## 2014-05-06 DIAGNOSIS — E785 Hyperlipidemia, unspecified: Secondary | ICD-10-CM

## 2014-05-06 DIAGNOSIS — E669 Obesity, unspecified: Secondary | ICD-10-CM

## 2014-05-06 DIAGNOSIS — Z0000 Encounter for general adult medical examination without abnormal findings: Secondary | ICD-10-CM

## 2014-05-06 DIAGNOSIS — I10 Essential (primary) hypertension: Secondary | ICD-10-CM

## 2014-05-06 DIAGNOSIS — K219 Gastro-esophageal reflux disease without esophagitis: Secondary | ICD-10-CM

## 2014-05-06 DIAGNOSIS — L409 Psoriasis, unspecified: Secondary | ICD-10-CM | POA: Insufficient documentation

## 2014-05-06 DIAGNOSIS — Z8249 Family history of ischemic heart disease and other diseases of the circulatory system: Secondary | ICD-10-CM

## 2014-05-06 DIAGNOSIS — Z23 Encounter for immunization: Secondary | ICD-10-CM

## 2014-05-06 LAB — LIPID PANEL
Cholesterol: 192 mg/dL (ref 0–200)
HDL: 40 mg/dL (ref >39)
LDL CALC: 114 mg/dL — AB (ref 0–99)
TRIGLYCERIDES: 190 mg/dL — AB (ref <150)
Total CHOL/HDL Ratio: 4.8 Ratio
VLDL: 38 mg/dL (ref 0–40)

## 2014-05-06 LAB — POCT URINALYSIS DIPSTICK
Bilirubin, UA: NEGATIVE
Blood, UA: NEGATIVE
Glucose, UA: NEGATIVE
KETONES UA: NEGATIVE
LEUKOCYTES UA: NEGATIVE
NITRITE UA: NEGATIVE
Protein, UA: NEGATIVE
Spec Grav, UA: 1.025
UROBILINOGEN UA: NEGATIVE
pH, UA: 7

## 2014-05-06 LAB — CBC WITH DIFFERENTIAL/PLATELET
BASOS PCT: 1 % (ref 0–1)
Basophils Absolute: 0.1 10*3/uL (ref 0.0–0.1)
EOS ABS: 0.3 10*3/uL (ref 0.0–0.7)
Eosinophils Relative: 4 % (ref 0–5)
HEMATOCRIT: 44.8 % (ref 39.0–52.0)
Hemoglobin: 15.8 g/dL (ref 13.0–17.0)
Lymphocytes Relative: 35 % (ref 12–46)
Lymphs Abs: 2.2 10*3/uL (ref 0.7–4.0)
MCH: 30.4 pg (ref 26.0–34.0)
MCHC: 35.3 g/dL (ref 30.0–36.0)
MCV: 86.3 fL (ref 78.0–100.0)
MPV: 9.7 fL (ref 9.4–12.4)
Monocytes Absolute: 0.4 10*3/uL (ref 0.1–1.0)
Monocytes Relative: 7 % (ref 3–12)
NEUTROS ABS: 3.4 10*3/uL (ref 1.7–7.7)
NEUTROS PCT: 53 % (ref 43–77)
PLATELETS: 293 10*3/uL (ref 150–400)
RBC: 5.19 MIL/uL (ref 4.22–5.81)
RDW: 13.1 % (ref 11.5–15.5)
WBC: 6.4 10*3/uL (ref 4.0–10.5)

## 2014-05-06 LAB — COMPREHENSIVE METABOLIC PANEL
ALBUMIN: 4.4 g/dL (ref 3.5–5.2)
ALK PHOS: 55 U/L (ref 39–117)
ALT: 36 U/L (ref 0–53)
AST: 21 U/L (ref 0–37)
BILIRUBIN TOTAL: 0.9 mg/dL (ref 0.2–1.2)
BUN: 15 mg/dL (ref 6–23)
CO2: 29 mEq/L (ref 19–32)
Calcium: 9.5 mg/dL (ref 8.4–10.5)
Chloride: 100 mEq/L (ref 96–112)
Creat: 0.97 mg/dL (ref 0.50–1.35)
GLUCOSE: 102 mg/dL — AB (ref 70–99)
Potassium: 3.7 mEq/L (ref 3.5–5.3)
Sodium: 139 mEq/L (ref 135–145)
TOTAL PROTEIN: 6.9 g/dL (ref 6.0–8.3)

## 2014-05-06 MED ORDER — HYDROCHLOROTHIAZIDE 25 MG PO TABS: 25.0000 mg | ORAL_TABLET | Freq: Every day | ORAL | Status: DC

## 2014-05-06 MED ORDER — POTASSIUM CHLORIDE CRYS ER 20 MEQ PO TBCR: EXTENDED_RELEASE_TABLET | ORAL | Status: DC

## 2014-05-06 MED ORDER — AMLODIPINE BESYLATE 10 MG PO TABS: 10.0000 mg | ORAL_TABLET | Freq: Every day | ORAL | Status: DC

## 2014-05-06 NOTE — Progress Notes (Signed)
Subjective:    Patient ID: Randy [name], male    DOB: 1966-02-28, 48 y.o.   MRN: [id number]  HPI He is here for a complete examination. He does have hypertension and continues on medications listed in the chart. He recently had vascular surgery to help with venous insufficiency and is wearing compression hose to help with the healing process. He continues on Zantac for his reflux and is taking daily although not having symptoms on a daily basis. He does have psoriasis but is having no difficulty with that. His exercise has been limited due to recent lower extremity surgery for the venous insufficiency. He has gained several pounds due to this. His father had an MI at age 74. He has no other concerns or complaints.   Review of Systems  All other systems reviewed and are negative.      Objective:   Physical Exam BP 128/90 mmHg  Pulse 72  Ht 5' 10.5" (1.791 m)  Wt 238 lb (107.956 kg)  BMI 33.66 kg/m2  General Appearance:    Alert, cooperative, no distress, appears stated age  Head:    Normocephalic, without obvious abnormality, atraumatic  Eyes:    PERRL, conjunctiva/corneas clear, EOM's intact, fundi    benign  Ears:    Normal TM's and external ear canals  Nose:   Nares normal, mucosa normal, no drainage or sinus   tenderness  Throat:   Lips, mucosa, and tongue normal; teeth and gums normal  Neck:   Supple, no lymphadenopathy;  thyroid:  no   enlargement/tenderness/nodules; no carotid   bruit or JVD  Back:    Spine nontender, no curvature, ROM normal, no CVA     tenderness  Lungs:     Clear to auscultation bilaterally without wheezes, rales or     ronchi; respirations unlabored  Chest Wall:    No tenderness or deformity   Heart:    Regular rate and rhythm, S1 and S2 normal, no murmur, rub   or gallop  Breast Exam:    No chest wall tenderness, masses or gynecomastia  Abdomen:     Soft, non-tender, nondistended, normoactive bowel sounds,    no masses, no hepatosplenomegaly          Extremities:   No clubbing, cyanosis or edema  Pulses:   2+ and symmetric all extremities  Skin:   Skin color, texture, turgor normal, no rashes or lesions  Lymph nodes:   Cervical, supraclavicular, and axillary nodes normal  Neurologic:   CNII-XII intact, normal strength, sensation and gait; reflexes 2+ and symmetric throughout          Psych:   Normal mood, affect, hygiene and grooming.          Assessment & Plan:  Routine general medical examination at a health care facility - Plan: Urinalysis Dipstick, CBC with Differential, Comprehensive metabolic panel, Lipid panel, hydrochlorothiazide (HYDRODIURIL) 25 MG tablet, amLODipine (NORVASC) 10 MG tablet  Essential hypertension - Plan: CBC with Differential, Comprehensive metabolic panel, hydrochlorothiazide (HYDRODIURIL) 25 MG tablet, amLODipine (NORVASC) 10 MG tablet, potassium chloride SA (KLOR-CON M20) 20 MEQ tablet  Family history of heart disease in male family member before age 12 - Plan: CBC with Differential, Comprehensive metabolic panel, Lipid panel  Gastroesophageal reflux disease without esophagitis  Hyperlipidemia LDL goal <100 - Plan: Lipid panel  Obesity (BMI 30-39.9) - Plan: CBC with Differential, Comprehensive metabolic panel, Lipid panel  Need for Tdap vaccination - Plan: Tdap vaccine greater than or equal to  7yo IM  Psoriasis  I encouraged him to continue with physical activity. He would like to get back to running but I explained walking with certainly be fine. Recommended at least 150 minutes a week. He is to continue on his other medications. Did recommend he use the Zantac for reflux on an as-needed basis. Also discussed getting off the Klor-Con and using OTC potassium supplementation.

## 2014-05-06 NOTE — Patient Instructions (Signed)
Check out the cost of lo-salt or no salt at the grocery store

## 2014-05-13 ENCOUNTER — Telehealth: Payer: Self-pay | Admitting: Family Medicine

## 2014-05-13 NOTE — Telephone Encounter (Signed)
Patient came for [place] to check his BP here and to check it with his machine. Left arm BP manuel- 122/80  Left arm BP from his machine 130/72

## 2014-05-13 NOTE — Telephone Encounter (Signed)
Let him know that his blood pressure machine is roughly the same with one number being slightly higher in your other one slightly lower.

## 2014-05-14 ENCOUNTER — Encounter: Payer: Managed Care, Other (non HMO) | Admitting: Family Medicine

## 2014-05-14 NOTE — Telephone Encounter (Signed)
Pt informed and verbalized understanding

## 2014-06-12 ENCOUNTER — Telehealth: Payer: Self-pay | Admitting: Family Medicine

## 2014-06-12 ENCOUNTER — Ambulatory Visit (INDEPENDENT_AMBULATORY_CARE_PROVIDER_SITE_OTHER): Payer: 59 | Admitting: Family Medicine

## 2014-06-12 ENCOUNTER — Encounter: Payer: Self-pay | Admitting: Family Medicine

## 2014-06-12 VITALS — BP 134/86 | HR 83 | Wt 237.0 lb

## 2014-06-12 DIAGNOSIS — K629 Disease of anus and rectum, unspecified: Secondary | ICD-10-CM

## 2014-06-12 DIAGNOSIS — K602 Anal fissure, unspecified: Secondary | ICD-10-CM

## 2014-06-12 MED ORDER — HYDROCORTISONE 2.5 % RE CREA: 1.0000 "application " | TOPICAL_CREAM | Freq: Two times a day (BID) | RECTAL | Status: DC

## 2014-06-12 NOTE — Telephone Encounter (Signed)
Pt checked out and asked had you sent in his rx.  I looked and saw that you sent in the [facility] and advised pt of that and he said there should be something else, but he didn't know what else.  Please advise pt [phone]

## 2014-06-12 NOTE — Progress Notes (Signed)
   Subjective:    Patient ID: Randy [name], male    DOB: 1966/01/20, 49 y.o.   MRN: [id number]  HPI He has a 4 day history of difficulty with rectal bleeding. This started after he was constipated and had a larger than normal BMP. Since then he has noted bright red blood streaking on his stool.   Review of Systems     Objective:   Physical Exam Anal exam shows no external lesions. Questionable fissure visible at 12:00. Digital rectal exam did show tenderness to palpation in that area.       Assessment & Plan:  Rectal fissure - Plan: hydrocortisone (ANUSOL-HC) 2.5 % rectal cream  recommend fluids, bulk in diet and exercise.

## 2014-06-12 NOTE — Telephone Encounter (Signed)
Pt informed and verbalized understanding

## 2014-06-12 NOTE — Patient Instructions (Signed)
Fluids, bulk in your diet, exercise, listen to your body when you have to have a BM

## 2014-06-12 NOTE — Telephone Encounter (Signed)
I called in the Anusol HC cream

## 2014-06-16 ENCOUNTER — Other Ambulatory Visit: Payer: Self-pay | Admitting: Family Medicine

## 2014-07-04 ENCOUNTER — Telehealth: Payer: Self-pay | Admitting: Family Medicine

## 2014-07-04 NOTE — Telephone Encounter (Signed)
Pt called to follow up on wellness form that he brought in with him at his appointment on 05/06/14 to be completed and faxed to [facility]. Pt says we were to wait on lab results to come in so that the form could be completed and then we were to fax the form. [facility] informed the pt that they have not received the form yet. I found the form scanned in pt's chart. Completed the cholesterol lab values and faxed form today. Called pt back to let him know that form was faxed today.

## 2014-07-18 ENCOUNTER — Other Ambulatory Visit: Payer: Self-pay | Admitting: Family Medicine

## 2014-07-30 ENCOUNTER — Ambulatory Visit (INDEPENDENT_AMBULATORY_CARE_PROVIDER_SITE_OTHER): Payer: 59 | Admitting: Family Medicine

## 2014-07-30 ENCOUNTER — Encounter: Payer: Self-pay | Admitting: Family Medicine

## 2014-07-30 VITALS — BP 140/82 | HR 78 | Wt 238.0 lb

## 2014-07-30 DIAGNOSIS — N419 Inflammatory disease of prostate, unspecified: Secondary | ICD-10-CM

## 2014-07-30 LAB — HEMOCCULT GUIAC POC 1CARD (OFFICE)

## 2014-07-30 LAB — POCT URINALYSIS DIPSTICK
Bilirubin, UA: NEGATIVE
GLUCOSE UA: NEGATIVE
KETONES UA: NEGATIVE
Leukocytes, UA: NEGATIVE
Nitrite, UA: NEGATIVE
Protein, UA: NEGATIVE
RBC UA: NEGATIVE
SPEC GRAV UA: 1.02
UROBILINOGEN UA: NEGATIVE
pH, UA: 6.5

## 2014-07-30 MED ORDER — SULFAMETHOXAZOLE-TRIMETHOPRIM 800-160 MG PO TABS: 1.0000 | ORAL_TABLET | Freq: Two times a day (BID) | ORAL | Status: DC

## 2014-07-30 NOTE — Progress Notes (Signed)
   Subjective:    Patient ID: Randy [name], male    DOB: March 19, 1966, 49 y.o.   MRN: [id number]  HPI He has noted blood in his semen on at least 2 occasions in the last several weeks. No fever, chills, abdominal pain, dysuria, discharge. Prior to this he was treated for a bronchitis with an antibiotic.Sexual activity is only with his wife   Review of Systems     Objective:   Physical Exam Abdominal exam shows no masses. Genital exam shows normal penis and testes. Rectal exam does show a slightly boggy but nontender prostate. Urinalysis is negative.       Assessment & Plan:  Prostatitis, unspecified prostatitis type - Plan: POCT occult blood stool, Urinalysis Dipstick, sulfamethoxazole-trimethoprim (BACTRIM DS,SEPTRA DS) 800-160 MG per tablet If he still has blood in his semen after he finishes the antibiotic, I will refer him to urology.

## 2014-07-30 NOTE — Patient Instructions (Signed)
If the blood doesn't go away after 2 weeks call for referral to urology

## 2014-08-13 ENCOUNTER — Telehealth: Payer: Self-pay | Admitting: Family Medicine

## 2014-08-13 NOTE — Telephone Encounter (Signed)
Refer to urology.  ?

## 2014-08-13 NOTE — Telephone Encounter (Signed)
done

## 2014-08-13 NOTE — Telephone Encounter (Signed)
Pt still has blood in semen. He says Dr Redmond School mentioned that if he still has this after he finished meds, pt would likely be referred to urologist

## 2014-09-18 ENCOUNTER — Other Ambulatory Visit: Payer: Self-pay | Admitting: Family Medicine

## 2014-12-10 ENCOUNTER — Other Ambulatory Visit: Payer: Self-pay | Admitting: Urology

## 2014-12-10 DIAGNOSIS — R361 Hematospermia: Secondary | ICD-10-CM

## 2014-12-27 ENCOUNTER — Ambulatory Visit (HOSPITAL_COMMUNITY)
Admission: RE | Admit: 2014-12-27 | Discharge: 2014-12-27 | Disposition: A | Discharge status: Home / Self Care | Payer: 59 | Source: Ambulatory Visit | Attending: Urology | Admitting: Urology

## 2014-12-27 DIAGNOSIS — N3289 Other specified disorders of bladder: Secondary | ICD-10-CM | POA: Insufficient documentation

## 2014-12-27 DIAGNOSIS — R361 Hematospermia: Secondary | ICD-10-CM

## 2014-12-27 LAB — POCT I-STAT CREATININE: Creatinine, Ser: 1.1 mg/dL (ref 0.61–1.24)

## 2014-12-27 MED ORDER — MIDAZOLAM HCL 2 MG/2ML IJ SOLN
INTRAMUSCULAR | Status: AC
  Filled 2014-12-27: qty 4

## 2014-12-27 MED ORDER — FENTANYL CITRATE (PF) 100 MCG/2ML IJ SOLN
INTRAMUSCULAR | Status: AC
  Filled 2014-12-27: qty 4

## 2014-12-27 MED ORDER — LIDOCAINE HCL (CARDIAC) 20 MG/ML IV SOLN
INTRAVENOUS | Status: AC
  Filled 2014-12-27: qty 5

## 2014-12-27 MED ORDER — GADOBENATE DIMEGLUMINE 529 MG/ML IV SOLN
20.0000 mL | Freq: Once | INTRAVENOUS | Status: AC | PRN
  Administered 2014-12-27: 20 mL via INTRAVENOUS

## 2014-12-27 MED ORDER — PROPOFOL 10 MG/ML IV BOLUS
INTRAVENOUS | Status: AC
  Filled 2014-12-27: qty 20

## 2015-02-07 ENCOUNTER — Encounter: Payer: Self-pay | Admitting: Medical

## 2015-02-07 ENCOUNTER — Ambulatory Visit (INDEPENDENT_AMBULATORY_CARE_PROVIDER_SITE_OTHER): Payer: 59 | Admitting: Medical

## 2015-02-07 VITALS — BP 122/70 | HR 64 | Temp 98.2°F | Resp 12 | Ht 72.0 in | Wt 238.0 lb

## 2015-02-07 DIAGNOSIS — M62830 Muscle spasm of back: Secondary | ICD-10-CM

## 2015-02-07 DIAGNOSIS — S39012A Strain of muscle, fascia and tendon of lower back, initial encounter: Secondary | ICD-10-CM | POA: Diagnosis not present

## 2015-02-07 DIAGNOSIS — Z23 Encounter for immunization: Secondary | ICD-10-CM

## 2015-02-07 MED ORDER — CYCLOBENZAPRINE HCL 10 MG PO TABS: ORAL_TABLET | ORAL | Status: DC

## 2015-02-07 NOTE — Progress Notes (Signed)
  Subjective:    Randy [name] is a 49 y.o. male who presents for evaluation of low back pain. The patient has had recurrent self limited episodes of low back pain in the past. Symptoms have been present for 4 days and are unchanged.  Onset was related to / precipitated by no known injury. He just started back walking for exercise recently.  The pain occurred when he was reaching down for soap in the shower.  The pain is located in the across the lower back and does not radiate. The pain is described as aching and stiffness and occurs intermittently. He rates his pain as moderate. Symptoms are exacerbated by extension, flexion, lifting, twisting and some with walking. Symptoms are improved by NSAIDs and rest. He has also tried heat which provided no symptom relief. He has no other symptoms associated with the back pain. The patient has no "red flag" history indicative of complicated back pain.  The following portions of the patient's history were reviewed and updated as appropriate: allergies, current medications, past family history, past medical history, past social history, past surgical history and problem list.  Review of Systems Constitutional: denies fever, chills, sweats, unexpected weight change, anorexia, fatigue Dermatology: rash, bruising, redness Cardiology: denies chest pain, palpitations, edema, orthopnea, paroxysmal nocturnal dyspnea Respiratory: denies cough, shortness of breath, dyspnea on exertion, wheezing, hemoptysis Gastroenterology: denies abdominal pain, nausea, vomiting, diarrhea, constipation, blood in stool, changes in bowel movement, dysphagia Musculoskeletal: denies arthralgias, myalgias, joint swelling, neck pain, cramping Urology: denies dysuria, difficulty urinating, hematuria, urinary frequency, urgency, incontinence Neurology: no headache, weakness, tingling, numbness, speech abnormality, memory loss, falls, dizziness      Objective:    Filed Vitals:   02/07/15  1035  BP: 122/70  Pulse: 64  Temp: 98.2 F (36.8 C)  Resp: 12    General appearance: alert, no distress, WD/WN, male Abdomen: +bs, soft, non tender, non distended, no masses, no hepatomegaly, no splenomegaly, no bruits Back: tender paraspinal region, mostly left lower back, mild pain with flexion and extension which are slow but relatively full.   MSK: UE nontender, normal ROM, LE nontender, normal ROM, no obvious deformity Extremities: no edema, no cyanosis, no clubbing Pulses: 2+ symmetric, upper and lower extremities Neuro: -SLR, normal leg sensation, strength, and DTRs.     Assessment:   Encounter Diagnoses  Name Primary?  . Back strain, initial encounter Yes  . Back muscle spasm   . Need for prophylactic vaccination and inoculation against influenza       Plan:   Natural history and expected course discussed. Questions answered. Proper lifting, bending technique discussed. Stretching exercises discussed. Regular aerobic and trunk strengthening exercises discussed. Short (3-4 day) period of relative rest recommended until acute symptoms improve. Heat to affected area as needed for local pain relief.  Medications prescribed: NSAIDs - increase the ibuprofen OTC to 3-4 tablets TID Muscle relaxants per medication orders.  Counseled on the influenza virus vaccine.  Vaccine information sheet given.  Influenza vaccine given after consent obtained.  Follow up: prn

## 2015-02-21 ENCOUNTER — Other Ambulatory Visit: Payer: Self-pay | Admitting: Family Medicine

## 2015-02-24 ENCOUNTER — Ambulatory Visit: Payer: Managed Care, Other (non HMO) | Admitting: Cardiology

## 2015-03-24 NOTE — Progress Notes (Signed)
      HPI: FU chest pain and hypertension. Venous Dopplers in April of 2014 showed venous insufficiency. Lower extremity arterial dopplers in May 2014 normal. Seen by Dr Fletcher Anon in Sept 2014 for venous insuff and initial conservative measures recommended. Venous ablation could be considered if remains symptomatic despite these measures. Stress echocardiogram February 2015 normal. Since last seen,  the patient denies any dyspnea on exertion, orthopnea, PND, pedal edema, palpitations, syncope or chest pain.   Current Outpatient Prescriptions  Medication Sig Dispense Refill  . amLODipine (NORVASC) 10 MG tablet Take 1 tablet (10 mg total) by mouth daily. 90 tablet 3  . aspirin 81 MG tablet Take 81 mg by mouth daily.      . cyclobenzaprine (FLEXERIL) 10 MG tablet 1/2-1 tablet in the day, 1 tablet po QHS prn for spasm 20 tablet 0  . hydrochlorothiazide (HYDRODIURIL) 25 MG tablet Take 1 tablet (25 mg total) by mouth daily. 90 tablet 3  . Multiple Vitamin (MULTIVITAMIN WITH MINERALS) TABS Take 1 tablet by mouth daily.    . nizatidine (AXID) 150 MG capsule TAKE ONE CAPSULE BY MOUTH DAILY 90 capsule 0  . Omega-3 Fatty Acids (FISH OIL PO) Take 2 tablets by mouth daily.     . potassium chloride SA (KLOR-CON M20) 20 MEQ tablet TAKE 1 TABLET (20 MEQ TOTAL) BY MOUTH DAILY. 90 tablet 3   No current facility-administered medications for this visit.     Past Medical History  Diagnosis Date  . Hypertension   . Obesity   . Psoriasis   . Family history of ischemic heart disease   . GERD (gastroesophageal reflux disease)     Past Surgical History  Procedure Laterality Date  . Toe surgery      right great toe  . Cystectomy      left chest    Social History   Social History  . Marital Status: Married    Spouse Name: N/A  . Number of Children: N/A  . Years of Education: N/A   Occupational History  . Not on file.   Social History Main Topics  . Smoking status: Never Smoker   . Smokeless  tobacco: Never Used  . Alcohol Use: 1.5 oz/week    3 drink(s) per week  . Drug Use: No  . Sexual Activity: Yes   Other Topics Concern  . Not on file   Social History Narrative    ROS: no fevers or chills, productive cough, hemoptysis, dysphasia, odynophagia, melena, hematochezia, dysuria, hematuria, rash, seizure activity, orthopnea, PND, pedal edema, claudication. Remaining systems are negative.  Physical Exam: Well-developed well-nourished in no acute distress.  Skin is warm and dry.  HEENT is normal.  Neck is supple.  Chest is clear to auscultation with normal expansion.  Cardiovascular exam is regular rate and rhythm.  Abdominal exam nontender or distended. No masses palpated. Extremities show no edema. neuro grossly intact  ECG Sinus rhythmAt a rate of 71. First-degree AV block. Nonspecific ST changes.

## 2015-03-31 ENCOUNTER — Encounter: Payer: Self-pay | Admitting: Cardiology

## 2015-03-31 ENCOUNTER — Ambulatory Visit (INDEPENDENT_AMBULATORY_CARE_PROVIDER_SITE_OTHER): Payer: 59 | Admitting: Cardiology

## 2015-03-31 VITALS — BP 142/90 | HR 71 | Ht 70.0 in | Wt 234.0 lb

## 2015-03-31 DIAGNOSIS — R072 Precordial pain: Secondary | ICD-10-CM

## 2015-03-31 DIAGNOSIS — I10 Essential (primary) hypertension: Secondary | ICD-10-CM | POA: Diagnosis not present

## 2015-03-31 DIAGNOSIS — E785 Hyperlipidemia, unspecified: Secondary | ICD-10-CM | POA: Diagnosis not present

## 2015-03-31 DIAGNOSIS — I872 Venous insufficiency (chronic) (peripheral): Secondary | ICD-10-CM | POA: Diagnosis not present

## 2015-03-31 NOTE — Assessment & Plan Note (Signed)
Blood pressure borderline. Continue present medications and follow at home. We can add additional medications if it is elevated in the future.

## 2015-03-31 NOTE — Patient Instructions (Signed)
Medication Instructions:   NO CHANGE  Follow-Up:  Your physician wants you to follow-up in: ONE YEAR WITH DR CRENSHAW You will receive a reminder letter in the mail two months in advance. If you don't receive a letter, please call our office to schedule the follow-up appointment.   If you need a refill on your cardiac medications before your next appointment, please call your pharmacy.  

## 2015-03-31 NOTE — Assessment & Plan Note (Signed)
Management per primary care. 

## 2015-03-31 NOTE — Assessment & Plan Note (Signed)
Occasional brief episodes of chest pain not related to exertion. Previous stress test negative. No further cardiac evaluation at this point.

## 2015-03-31 NOTE — Assessment & Plan Note (Signed)
Patient's venous insufficiency and varicose veins are followed and treated in [facility].

## 2015-05-23 ENCOUNTER — Other Ambulatory Visit: Payer: Self-pay | Admitting: Family Medicine

## 2015-05-26 NOTE — Telephone Encounter (Signed)
Pt has physical appt this Wednesday.

## 2015-05-28 ENCOUNTER — Encounter: Payer: Self-pay | Admitting: Family Medicine

## 2015-05-28 ENCOUNTER — Ambulatory Visit (INDEPENDENT_AMBULATORY_CARE_PROVIDER_SITE_OTHER): Payer: 59 | Admitting: Family Medicine

## 2015-05-28 VITALS — BP 142/92 | HR 88 | Ht 70.25 in | Wt 237.6 lb

## 2015-05-28 DIAGNOSIS — K219 Gastro-esophageal reflux disease without esophagitis: Secondary | ICD-10-CM

## 2015-05-28 DIAGNOSIS — Z0000 Encounter for general adult medical examination without abnormal findings: Secondary | ICD-10-CM | POA: Diagnosis not present

## 2015-05-28 DIAGNOSIS — R361 Hematospermia: Secondary | ICD-10-CM

## 2015-05-28 DIAGNOSIS — E669 Obesity, unspecified: Secondary | ICD-10-CM | POA: Diagnosis not present

## 2015-05-28 DIAGNOSIS — Z8249 Family history of ischemic heart disease and other diseases of the circulatory system: Secondary | ICD-10-CM | POA: Diagnosis not present

## 2015-05-28 DIAGNOSIS — Z1211 Encounter for screening for malignant neoplasm of colon: Secondary | ICD-10-CM

## 2015-05-28 DIAGNOSIS — L409 Psoriasis, unspecified: Secondary | ICD-10-CM

## 2015-05-28 DIAGNOSIS — E785 Hyperlipidemia, unspecified: Secondary | ICD-10-CM

## 2015-05-28 DIAGNOSIS — I10 Essential (primary) hypertension: Secondary | ICD-10-CM

## 2015-05-28 LAB — CBC WITH DIFFERENTIAL/PLATELET
BASOS ABS: 0.1 10*3/uL (ref 0.0–0.1)
BASOS PCT: 1 % (ref 0–1)
EOS ABS: 0.1 10*3/uL (ref 0.0–0.7)
Eosinophils Relative: 2 % (ref 0–5)
HCT: 46.8 % (ref 39.0–52.0)
Hemoglobin: 16.2 g/dL (ref 13.0–17.0)
Lymphocytes Relative: 34 % (ref 12–46)
Lymphs Abs: 2.1 10*3/uL (ref 0.7–4.0)
MCH: 30.3 pg (ref 26.0–34.0)
MCHC: 34.6 g/dL (ref 30.0–36.0)
MCV: 87.6 fL (ref 78.0–100.0)
MPV: 9.4 fL (ref 8.6–12.4)
Monocytes Absolute: 0.4 10*3/uL (ref 0.1–1.0)
Monocytes Relative: 6 % (ref 3–12)
NEUTROS PCT: 57 % (ref 43–77)
Neutro Abs: 3.6 10*3/uL (ref 1.7–7.7)
PLATELETS: 314 10*3/uL (ref 150–400)
RBC: 5.34 MIL/uL (ref 4.22–5.81)
RDW: 14 % (ref 11.5–15.5)
WBC: 6.3 10*3/uL (ref 4.0–10.5)

## 2015-05-28 LAB — LIPID PANEL
CHOL/HDL RATIO: 4.7 ratio (ref <=5.0)
Cholesterol: 201 mg/dL — ABNORMAL HIGH (ref 125–200)
HDL: 43 mg/dL (ref >=40)
LDL Cholesterol: 119 mg/dL (ref <130)
Triglycerides: 194 mg/dL — ABNORMAL HIGH (ref <150)
VLDL: 39 mg/dL — ABNORMAL HIGH (ref <30)

## 2015-05-28 LAB — COMPREHENSIVE METABOLIC PANEL
ALK PHOS: 52 U/L (ref 40–115)
ALT: 24 U/L (ref 9–46)
AST: 16 U/L (ref 10–35)
Albumin: 4.5 g/dL (ref 3.6–5.1)
BUN: 12 mg/dL (ref 7–25)
CO2: 30 mmol/L (ref 20–31)
CREATININE: 0.99 mg/dL (ref 0.70–1.33)
Calcium: 9.5 mg/dL (ref 8.6–10.3)
Chloride: 101 mmol/L (ref 98–110)
GLUCOSE: 103 mg/dL — AB (ref 65–99)
Potassium: 4.4 mmol/L (ref 3.5–5.3)
SODIUM: 139 mmol/L (ref 135–146)
Total Bilirubin: 0.8 mg/dL (ref 0.2–1.2)
Total Protein: 6.9 g/dL (ref 6.1–8.1)

## 2015-05-28 NOTE — Progress Notes (Signed)
Subjective:    Patient ID: Randy [name], male    DOB: 12-Jul-1965, 50 y.o.   MRN: [id number]  HPI He is here for complete examination.He does have a previous history of hematospermia and BPH. He has been seen by urology and had a workup apparently including an MRI all of which was negative. He is scheduled for routine follow-up with him and states he still is having blood in his semen. He also complains of a bitemporal headache but no nausea, vomiting, blurred or double vision. This occurs once per month. He is concerned about blood pressure causing this. He does have reflux disease and is on nizatidine control of this. He continues on medications for his blood pressure. Presently he is on no medicines for cholesterol although he does have a positive family history of heart disease. He does have psoriasis however it is mainly on knees and elbows and causes very little difficulty. He has had some recent lower extremity varicosities treated which has interfered with his exercise regimen. At this point he is not having much difficulty with that. He has no other concerns or complaints.He has not had any allergy symptoms, chest pain, shortness of breath. Family and social history as well as health maintenance and immunizations was reviewed.   Review of Systems  All other systems reviewed and are negative.      Objective:   Physical Exam BP 142/92 mmHg  Pulse 88  Ht 5' 10.25" (1.784 m)  Wt 237 lb 9.6 oz (107.775 kg)  BMI 33.86 kg/m2  SpO2 98%  General Appearance:    Alert, cooperative, no distress, appears stated age  Head:    Normocephalic, without obvious abnormality, atraumatic  Eyes:    PERRL, conjunctiva/corneas clear, EOM's intact, fundi    benign  Ears:    Normal TM's and external ear canals  Nose:   Nares normal, mucosa normal, no drainage or sinus   tenderness  Throat:   Lips, mucosa, and tongue normal; teeth and gums normal  Neck:   Supple, no lymphadenopathy;  thyroid:  no    enlargement/tenderness/nodules; no carotid   bruit or JVD  Back:    Spine nontender, no curvature, ROM normal, no CVA     tenderness  Lungs:     Clear to auscultation bilaterally without wheezes, rales or     ronchi; respirations unlabored  Chest Wall:    No tenderness or deformity   Heart:    Regular rate and rhythm, S1 and S2 normal, no murmur, rub   or gallop  Breast Exam:    No chest wall tenderness, masses or gynecomastia  Abdomen:     Soft, non-tender, nondistended, normoactive bowel sounds,    no masses, no hepatosplenomegaly  Genitalia:  Deferred  Rectal:  Deferred  Extremities:   No clubbing, cyanosis or edema  Pulses:   2+ and symmetric all extremities  Skin:   Skin color, texture, turgor normal, Slightly dry and raised patches are noted on both elbows and knees.  Lymph nodes:   Cervical, supraclavicular, and axillary nodes normal  Neurologic:   CNII-XII intact, normal strength, sensation and gait; reflexes 2+ and symmetric throughout          Psych:   Normal mood, affect, hygiene and grooming.          Assessment & Plan:  Routine general medical examination at a health care facility - Plan: CBC with Differential/Platelet, Comprehensive metabolic panel, Lipid panel  Screening for colon cancer - Plan:  Ambulatory referral to Gastroenterology  Family history of heart disease in male family member before age 16 - Plan: CBC with Differential/Platelet, Comprehensive metabolic panel, Lipid panel  Essential hypertension  Gastroesophageal reflux disease without esophagitis  Hyperlipidemia LDL goal <100 - Plan: Lipid panel  Obesity (BMI 30-39.9)  Psoriasis I discussed the blood pressure with him and we will most likely at some point in the near future increase his medications. Also discussed the headache and explained that it is definitely not from high blood pressure. Recommended supportive care for this. Discussed increasing his physical activity to help with his weight  reduction. He will continue to be followed by urology. Explained that repeat rectal and PSA testing at this point would not be needed. GI referral made for colonoscopy

## 2015-05-30 ENCOUNTER — Encounter: Payer: Self-pay | Admitting: Gastroenterology

## 2015-05-30 ENCOUNTER — Telehealth: Payer: Self-pay | Admitting: Cardiology

## 2015-05-30 NOTE — Telephone Encounter (Signed)
Spoke with pt, Aware of dr crenshaw's recommendations.  °

## 2015-05-30 NOTE — Telephone Encounter (Signed)
Diet for mild hyperlipidemia    Kirk Ruths

## 2015-05-30 NOTE — Telephone Encounter (Signed)
Spoke with pt, he wants to make sure dr Stanford Breed looks at his cholesterol numbers. Will forward for dr Jacalyn Lefevre review

## 2015-05-30 NOTE — Telephone Encounter (Signed)
Please cal,he wants to discuss his cholesterol numbers.

## 2015-07-04 ENCOUNTER — Ambulatory Visit (AMBULATORY_SURGERY_CENTER): Payer: Self-pay | Admitting: *Deleted

## 2015-07-04 VITALS — Ht 70.0 in | Wt 239.2 lb

## 2015-07-04 DIAGNOSIS — Z1211 Encounter for screening for malignant neoplasm of colon: Secondary | ICD-10-CM

## 2015-07-04 MED ORDER — NA SULFATE-K SULFATE-MG SULF 17.5-3.13-1.6 GM/177ML PO SOLN: 1.0000 | Freq: Once | ORAL | Status: DC

## 2015-07-04 NOTE — Progress Notes (Signed)
Denies allergies to eggs or soy products. Denies complications with sedation or anesthesia. Denies O2 use. Denies use of diet or weight loss medications.  Emmi instructions given for colonoscopy.  

## 2015-07-15 ENCOUNTER — Other Ambulatory Visit: Payer: Self-pay | Admitting: Family Medicine

## 2015-07-18 ENCOUNTER — Encounter: Payer: Self-pay | Admitting: Gastroenterology

## 2015-07-18 ENCOUNTER — Ambulatory Visit (AMBULATORY_SURGERY_CENTER): Payer: 59 | Admitting: Gastroenterology

## 2015-07-18 ENCOUNTER — Encounter: Payer: 59 | Admitting: Gastroenterology

## 2015-07-18 VITALS — BP 113/75 | HR 69 | Temp 97.7°F | Resp 13 | Ht 70.0 in | Wt 239.0 lb

## 2015-07-18 DIAGNOSIS — Z1211 Encounter for screening for malignant neoplasm of colon: Secondary | ICD-10-CM

## 2015-07-18 MED ORDER — SODIUM CHLORIDE 0.9 % IV SOLN: 500.0000 mL | INTRAVENOUS | Status: DC

## 2015-07-18 NOTE — Progress Notes (Signed)
Stable to RR 

## 2015-07-18 NOTE — Patient Instructions (Signed)
YOU HAD AN ENDOSCOPIC PROCEDURE TODAY AT [facility]:   Refer to the procedure report that was given to you for any specific questions about what was found during the examination.  If the procedure report does not answer your questions, please call your gastroenterologist to clarify.  If you requested that your care partner not be given the details of your procedure findings, then the procedure report has been included in a sealed envelope for you to review at your convenience later.  YOU SHOULD EXPECT: Some feelings of bloating in the abdomen. Passage of more gas than usual.  Walking can help get rid of the air that was put into your GI tract during the procedure and reduce the bloating. If you had a lower endoscopy (such as a colonoscopy or flexible sigmoidoscopy) you may notice spotting of blood in your stool or on the toilet paper. If you underwent a bowel prep for your procedure, you may not have a normal bowel movement for a few days.  Please Note:  You might notice some irritation and congestion in your nose or some drainage.  This is from the oxygen used during your procedure.  There is no need for concern and it should clear up in a day or so.  SYMPTOMS TO REPORT IMMEDIATELY:   Following lower endoscopy (colonoscopy or flexible sigmoidoscopy):  Excessive amounts of blood in the stool  Significant tenderness or worsening of abdominal pains  Swelling of the abdomen that is new, acute  Fever of 100F or higher   For urgent or emergent issues, a gastroenterologist can be reached at any hour by calling [phone].   DIET: Your first meal following the procedure should be a small meal and then it is ok to progress to your normal diet. Heavy or fried foods are harder to digest and may make you feel nauseous or bloated.  Likewise, meals heavy in dairy and vegetables can increase bloating.  Drink plenty of fluids but you should avoid alcoholic beverages for 24  hours.  ACTIVITY:  You should plan to take it easy for the rest of today and you should NOT DRIVE or use heavy machinery until tomorrow (because of the sedation medicines used during the test).    FOLLOW UP: Our staff will call the number listed on your records the next business day following your procedure to check on you and address any questions or concerns that you may have regarding the information given to you following your procedure. If we do not reach you, we will leave a message.  However, if you are feeling well and you are not experiencing any problems, there is no need to return our call.  We will assume that you have returned to your regular daily activities without incident.  If any biopsies were taken you will be contacted by phone or by letter within the next 1-3 weeks.  Please call us at [phone] if you have not heard about the biopsies in 3 weeks.    SIGNATURES/CONFIDENTIALITY: You and/or your care partner have signed paperwork which will be entered into your electronic medical record.  These signatures attest to the fact that that the information above on your After Visit Summary has been reviewed and is understood.  Full responsibility of the confidentiality of this discharge information lies with you and/or your care-partner.  Hemorrhoid handout given Continue normal screening Colon cancer screening

## 2015-07-18 NOTE — Op Note (Signed)
[place]  [address] [place], [address]   COLONOSCOPY PROCEDURE REPORT  PATIENT: Randy [name], Randy [name]  MR#: [id number] BIRTHDATE: 03-06-1966 , 49  yrs. old GENDER: male ENDOSCOPIST: Ladene Artist, MD, [facility] REFERRED BY:  Jill Alexanders MD PROCEDURE DATE:  07/18/2015 PROCEDURE:   Colonoscopy, screening First Screening Colonoscopy - Avg.  risk and is 50 yrs.  old or older Yes.  Prior Negative Screening - Now for repeat screening. N/A  History of Adenoma - Now for follow-up colonoscopy & has been > or = to 3 yrs.  N/A  Polyps removed today? No Recommend repeat exam, <10 yrs? No ASA CLASS:   Class II INDICATIONS:Screening for colonic neoplasia and Colorectal Neoplasm Risk Assessment for this procedure is average risk. MEDICATIONS: Monitored anesthesia care, Propofol 400 mg IV, and lidocaine 40 mg IV  DESCRIPTION OF PROCEDURE:   After the risks benefits and alternatives of the procedure were thoroughly explained, informed consent was obtained.  The digital rectal exam revealed no abnormalities of the rectum.   The LB [id number]  endoscope was introduced through the anus and advanced to the cecum, which was identified by both the appendix and ileocecal valve. No adverse events experienced.   The quality of the prep was excellent. (Suprep was used)  The instrument was then slowly withdrawn as the colon was fully examined. Estimated blood loss is zero unless otherwise noted in this procedure report.   COLON FINDINGS: A normal appearing cecum, ileocecal valve, and appendiceal orifice were identified.  The ascending, transverse, descending, sigmoid colon, and rectum appeared unremarkable. Retroflexed views revealed internal Grade I hemorrhoids. The time to cecum = 2.5 Withdrawal time = 8.8   The scope was withdrawn and the procedure completed. COMPLICATIONS: There were no immediate complications.  ENDOSCOPIC IMPRESSION: 1.  Normal colonoscopy 2.  Grade I  internal hemorrhoids  RECOMMENDATIONS: Continue current colorectal screening recommendations for "routine risk" patients with a repeat colonoscopy in 10 years.  eSigned:  Ladene Artist, MD, [facility] 07/18/2015 2:10 PM

## 2015-07-21 ENCOUNTER — Telehealth: Payer: Self-pay | Admitting: *Deleted

## 2015-07-21 NOTE — Telephone Encounter (Signed)
  Follow up Call-  Call back number 07/18/2015  Post procedure Call Back phone  # [phone]  Permission to leave phone message Yes     No answer, left message.

## 2015-08-20 ENCOUNTER — Other Ambulatory Visit: Payer: Self-pay | Admitting: Family Medicine

## 2015-08-21 NOTE — Telephone Encounter (Signed)
Is this okay to refill? 

## 2015-09-13 ENCOUNTER — Other Ambulatory Visit: Payer: Self-pay | Admitting: Family Medicine

## 2015-10-11 ENCOUNTER — Other Ambulatory Visit: Payer: Self-pay | Admitting: Family Medicine

## 2015-10-14 NOTE — Telephone Encounter (Signed)
Is this okay to refill? 

## 2015-10-20 ENCOUNTER — Telehealth: Payer: Self-pay

## 2015-10-20 NOTE — Telephone Encounter (Signed)
error 

## 2015-10-23 ENCOUNTER — Ambulatory Visit (INDEPENDENT_AMBULATORY_CARE_PROVIDER_SITE_OTHER): Payer: 59 | Admitting: Family Medicine

## 2015-10-23 ENCOUNTER — Encounter: Payer: Self-pay | Admitting: Family Medicine

## 2015-10-23 VITALS — BP 122/80 | HR 70 | Temp 98.0°F | Wt 238.0 lb

## 2015-10-23 DIAGNOSIS — J45991 Cough variant asthma: Secondary | ICD-10-CM

## 2015-10-23 MED ORDER — ALBUTEROL SULFATE 108 (90 BASE) MCG/ACT IN AEPB: 2.0000 | INHALATION_SPRAY | Freq: Four times a day (QID) | RESPIRATORY_TRACT | Status: DC | PRN

## 2015-10-23 NOTE — Progress Notes (Signed)
Subjective:     Patient ID: Randy [name], male   DOB: Oct 05, 1965, 50 y.o.   MRN: [id number]  HPI Randy [name] presents with a ~3 year history of 1 minute dry coughing spells that start with tickling in his throat every spring time.  These symptoms seem to be worse when going outside or with exposure to cleaning chemicals.  He denies fever, sore throat, PND, SOB, wheezing, history of asthma as a child, watery eyes, runny nose, and burning chest pain.  He is not on an ACE inhibitor. He has tried Claritin-D OTC for the past 2-3 days without relief.      Review of Systems     Objective:   Physical Exam Alert and in no distress.  Pharyngeal area is normal.  Neck is supple without adenopathy or thyromegaly.  Cardiac exam shows a regular sinus rhythm without murmurs or gallops.  Lungs are clear to auscultation.     Assessment / Plan:     Cough variant asthma - Plan: Albuterol Sulfate (PROAIR RESPICLICK) 123XX123 (90 Base) MCG/ACT AEPB While he does not describe any difficulty breathing, will try albuterol inhaler w/ coughing spells to see if his symptoms and breathing improves.        History and physical exam conducted by Marylen Ponto (Medical Student) in conjunction with Dr. Redmond School.

## 2015-12-14 ENCOUNTER — Other Ambulatory Visit: Payer: Self-pay | Admitting: Family Medicine

## 2016-02-10 ENCOUNTER — Other Ambulatory Visit: Payer: Self-pay | Admitting: Family Medicine

## 2016-02-10 DIAGNOSIS — K602 Anal fissure, unspecified: Secondary | ICD-10-CM

## 2016-02-10 NOTE — Telephone Encounter (Signed)
Is this okay to refill? 

## 2016-03-12 ENCOUNTER — Other Ambulatory Visit: Payer: Self-pay | Admitting: Family Medicine

## 2016-03-17 ENCOUNTER — Encounter: Payer: Self-pay | Admitting: *Deleted

## 2016-03-29 NOTE — Progress Notes (Signed)
HPI: FU chest pain and hypertension. Venous Dopplers in April of 2014 showed venous insufficiency. Lower extremity arterial dopplers in May 2014 normal. Seen by Dr Fletcher Anon in Sept 2014 for venous insuff and initial conservative measures recommended. Venous ablation could be considered if remains symptomatic despite these measures. Stress echocardiogram February 2015 normal. Since last seen, the patient denies any dyspnea on exertion, orthopnea, PND, pedal edema, palpitations, syncope or chest pain.   Current Outpatient Prescriptions  Medication Sig Dispense Refill  . Albuterol Sulfate (PROAIR RESPICLICK) 123XX123 (90 Base) MCG/ACT AEPB Inhale 2 puffs into the lungs 4 (four) times daily as needed. 1 each 0  . amLODipine (NORVASC) 10 MG tablet Take 1 tablet (10 mg total) by mouth daily. 90 tablet 3  . aspirin 81 MG tablet Take 81 mg by mouth daily.      . hydrochlorothiazide (HYDRODIURIL) 25 MG tablet TAKE 1 TABLET (25 MG TOTAL) BY MOUTH DAILY. 90 tablet 0  . KLOR-CON M20 20 MEQ tablet TAKE 1 TABLET (20 MEQ TOTAL) BY MOUTH DAILY. 90 tablet 3  . Multiple Vitamin (MULTIVITAMIN WITH MINERALS) TABS Take 1 tablet by mouth daily.    . nizatidine (AXID) 150 MG capsule TAKE ONE CAPSULE BY MOUTH DAILY 90 capsule 3  . Omega-3 Fatty Acids (FISH OIL PO) Take 2 tablets by mouth daily.      No current facility-administered medications for this visit.      Past Medical History:  Diagnosis Date  . Family history of ischemic heart disease   . GERD (gastroesophageal reflux disease)   . Hypertension   . Obesity   . Psoriasis     Past Surgical History:  Procedure Laterality Date  . CYSTECTOMY     left chest  . superficial vein removed Bilateral   . TOE SURGERY     right great toe    Social History   Social History  . Marital status: Married    Spouse name: N/A  . Number of children: N/A  . Years of education: N/A   Occupational History  . Not on file.   Social History Main Topics  .  Smoking status: Never Smoker  . Smokeless tobacco: Never Used  . Alcohol use 1.5 oz/week    3 Standard drinks or equivalent per week  . Drug use: No  . Sexual activity: Yes   Other Topics Concern  . Not on file   Social History Narrative  . No narrative on file    Family History  Problem Relation Age of Onset  . Heart disease Father   . Hypertension Father   . Heart failure Father   . Heart attack Father 83    both age 96 & 18  . Colon cancer Neg Hx     ROS: no fevers or chills, productive cough, hemoptysis, dysphasia, odynophagia, melena, hematochezia, dysuria, hematuria, rash, seizure activity, orthopnea, PND, pedal edema, claudication. Remaining systems are negative.  Physical Exam: Well-developed well-nourished in no acute distress.  Skin is warm and dry.  HEENT is normal.  Neck is supple.  Chest is clear to auscultation with normal expansion.  Cardiovascular exam is regular rate and rhythm.  Abdominal exam nontender or distended. No masses palpated. Extremities show no edema. neuro grossly intact  ECG-Sinus rhythm and no ST changes.  A/P  1 Hyperlipidemia managed by primary care.  2 hypertension-blood pressure elevated. He will track this at home and if his systolic is greater than [id number] or diastolic greater than  85 we will add additional medications.  3 chest pain- no further symptoms. Previous stress test normal.  4 venous insufficiency- no recent problems. Edema is controlled.  Kirk Ruths, MD

## 2016-04-01 ENCOUNTER — Encounter: Payer: Self-pay | Admitting: Cardiology

## 2016-04-01 ENCOUNTER — Ambulatory Visit (INDEPENDENT_AMBULATORY_CARE_PROVIDER_SITE_OTHER): Payer: 59 | Admitting: Cardiology

## 2016-04-01 VITALS — BP 162/88 | HR 75 | Ht 70.0 in | Wt 243.2 lb

## 2016-04-01 DIAGNOSIS — E7800 Pure hypercholesterolemia, unspecified: Secondary | ICD-10-CM

## 2016-04-01 DIAGNOSIS — I10 Essential (primary) hypertension: Secondary | ICD-10-CM

## 2016-04-01 NOTE — Patient Instructions (Signed)
Your physician wants you to follow-up in: ONE YEAR WITH DR CRENSHAW You will receive a reminder letter in the mail two months in advance. If you don't receive a letter, please call our office to schedule the follow-up appointment.   If you need a refill on your cardiac medications before your next appointment, please call your pharmacy.  

## 2016-06-04 ENCOUNTER — Encounter: Payer: 59 | Admitting: Family Medicine

## 2016-06-10 ENCOUNTER — Encounter: Payer: Self-pay | Admitting: Family Medicine

## 2016-06-10 ENCOUNTER — Ambulatory Visit (INDEPENDENT_AMBULATORY_CARE_PROVIDER_SITE_OTHER): Payer: 59 | Admitting: Family Medicine

## 2016-06-10 ENCOUNTER — Other Ambulatory Visit: Payer: Self-pay | Admitting: Family Medicine

## 2016-06-10 VITALS — BP 128/82 | HR 86 | Ht 71.0 in | Wt 238.2 lb

## 2016-06-10 DIAGNOSIS — K219 Gastro-esophageal reflux disease without esophagitis: Secondary | ICD-10-CM

## 2016-06-10 DIAGNOSIS — Z0000 Encounter for general adult medical examination without abnormal findings: Secondary | ICD-10-CM | POA: Diagnosis not present

## 2016-06-10 DIAGNOSIS — I10 Essential (primary) hypertension: Secondary | ICD-10-CM

## 2016-06-10 DIAGNOSIS — Z8249 Family history of ischemic heart disease and other diseases of the circulatory system: Secondary | ICD-10-CM

## 2016-06-10 DIAGNOSIS — E669 Obesity, unspecified: Secondary | ICD-10-CM | POA: Diagnosis not present

## 2016-06-10 DIAGNOSIS — L409 Psoriasis, unspecified: Secondary | ICD-10-CM

## 2016-06-10 DIAGNOSIS — F409 Phobic anxiety disorder, unspecified: Secondary | ICD-10-CM

## 2016-06-10 DIAGNOSIS — E785 Hyperlipidemia, unspecified: Secondary | ICD-10-CM

## 2016-06-10 LAB — COMPREHENSIVE METABOLIC PANEL
ALK PHOS: 56 U/L (ref 40–115)
ALT: 31 U/L (ref 9–46)
AST: 23 U/L (ref 10–35)
Albumin: 4.7 g/dL (ref 3.6–5.1)
BUN: 13 mg/dL (ref 7–25)
CALCIUM: 10 mg/dL (ref 8.6–10.3)
CO2: 26 mmol/L (ref 20–31)
Chloride: 102 mmol/L (ref 98–110)
Creat: 1.05 mg/dL (ref 0.70–1.33)
Glucose, Bld: 100 mg/dL — ABNORMAL HIGH (ref 65–99)
POTASSIUM: 4.1 mmol/L (ref 3.5–5.3)
Sodium: 142 mmol/L (ref 135–146)
TOTAL PROTEIN: 7.4 g/dL (ref 6.1–8.1)
Total Bilirubin: 0.8 mg/dL (ref 0.2–1.2)

## 2016-06-10 LAB — CBC WITH DIFFERENTIAL/PLATELET
BASOS PCT: 0 %
Basophils Absolute: 0 cells/uL (ref 0–200)
EOS ABS: 83 {cells}/uL (ref 15–500)
Eosinophils Relative: 1 %
HEMATOCRIT: 46.7 % (ref 38.5–50.0)
HEMOGLOBIN: 16 g/dL (ref 13.2–17.1)
LYMPHS ABS: 1992 {cells}/uL (ref 850–3900)
Lymphocytes Relative: 24 %
MCH: 30.2 pg (ref 27.0–33.0)
MCHC: 34.3 g/dL (ref 32.0–36.0)
MCV: 88.3 fL (ref 80.0–100.0)
MONO ABS: 415 {cells}/uL (ref 200–950)
MPV: 9.7 fL (ref 7.5–12.5)
Monocytes Relative: 5 %
NEUTROS ABS: 5810 {cells}/uL (ref 1500–7800)
Neutrophils Relative %: 70 %
Platelets: 331 10*3/uL (ref 140–400)
RBC: 5.29 MIL/uL (ref 4.20–5.80)
RDW: 13.9 % (ref 11.0–15.0)
WBC: 8.3 10*3/uL (ref 4.0–10.5)

## 2016-06-10 LAB — POCT URINALYSIS DIPSTICK
BILIRUBIN UA: NEGATIVE
GLUCOSE UA: NEGATIVE
Ketones, UA: NEGATIVE
LEUKOCYTES UA: NEGATIVE
NITRITE UA: NEGATIVE
Protein, UA: NEGATIVE
RBC UA: NEGATIVE
Spec Grav, UA: 1.02
Urobilinogen, UA: NEGATIVE
pH, UA: 7

## 2016-06-10 LAB — LIPID PANEL
CHOL/HDL RATIO: 4.6 ratio (ref <5.0)
CHOLESTEROL: 209 mg/dL — AB (ref <200)
HDL: 45 mg/dL (ref >40)
LDL Cholesterol: 135 mg/dL — ABNORMAL HIGH (ref <100)
Triglycerides: 145 mg/dL (ref <150)
VLDL: 29 mg/dL (ref <30)

## 2016-06-10 MED ORDER — NIZATIDINE 150 MG PO CAPS: 150.0000 mg | ORAL_CAPSULE | Freq: Every day | ORAL | 3 refills | Status: DC

## 2016-06-10 MED ORDER — AMLODIPINE BESYLATE 10 MG PO TABS: 10.0000 mg | ORAL_TABLET | Freq: Every day | ORAL | 3 refills | Status: DC

## 2016-06-10 MED ORDER — ALPRAZOLAM 0.25 MG PO TABS: 0.2500 mg | ORAL_TABLET | Freq: Two times a day (BID) | ORAL | 0 refills | Status: DC | PRN

## 2016-06-10 MED ORDER — POTASSIUM CHLORIDE CRYS ER 20 MEQ PO TBCR: EXTENDED_RELEASE_TABLET | ORAL | 3 refills | Status: DC

## 2016-06-10 MED ORDER — HYDROCHLOROTHIAZIDE 25 MG PO TABS: ORAL_TABLET | ORAL | 3 refills | Status: DC

## 2016-06-10 NOTE — Progress Notes (Signed)
Subjective:    Patient ID: Randy [name], male    DOB: [birth year], 51 y.o.   MRN: [id number]  HPI He is here for complete examination. His main complaint today is he has noted in the last several years, increasing difficulty with driving mainly on the interstates. He states that he usually has to drive to [place] once per month. He does become quite uptight over this. He does not remember anything that particularly cause this. He does have underlying psoriasis but is having no difficulty with that. He continues on his amlodipine and HCTZ for his blood pressure. He has started back into an exercise program. Does have reflux disease and his using Axid regularly for this. There is a family history of heart disease and he is using omega-3 area and family and social history as well as health maintenance and immunizations were reviewed.   Review of Systems  All other systems reviewed and are negative.      Objective:   Physical Exam BP 128/82   Pulse 86   Ht 5\' 11"  (1.803 m)   Wt 238 lb 3.2 oz (108 kg)   SpO2 98%   BMI 33.22 kg/m   General Appearance:    Alert, cooperative, no distress, appears stated age  Head:    Normocephalic, without obvious abnormality, atraumatic  Eyes:    PERRL, conjunctiva/corneas clear, EOM's intact, fundi    benign  Ears:    Normal TM's and external ear canals  Nose:   Nares normal, mucosa normal, no drainage or sinus   tenderness  Throat:   Lips, mucosa, and tongue normal; teeth and gums normal  Neck:   Supple, no lymphadenopathy;  thyroid:  no   enlargement/tenderness/nodules; no carotid   bruit or JVD     Lungs:     Clear to auscultation bilaterally without wheezes, rales or     ronchi; respirations unlabored      Heart:    Regular rate and rhythm, S1 and S2 normal, no murmur, rub   or gallop     Abdomen:     Soft, non-tender, nondistended, normoactive bowel sounds,    no masses, no hepatosplenomegaly  Genitalia:  Deferred   Rectal:   deferred    Extremities:   No clubbing, cyanosis or edema  Pulses:   2+ and symmetric all extremities  Skin:   Skin color, texture, turgor normal, no rashes or lesions  Lymph nodes:   Cervical, supraclavicular, and axillary nodes normal  Neurologic:   CNII-XII intact, normal strength, sensation and gait; reflexes 2+ and symmetric throughout          Psych:   Normal mood, affect, hygiene and grooming.          Assessment & Plan:  Routine general medical examination at a health care facility - Plan: POCT Urinalysis Dipstick, CBC with Differential/Platelet, Comprehensive metabolic panel, Lipid panel, amLODipine (NORVASC) 10 MG tablet  Fear - Plan: ALPRAZolam (XANAX) 0.25 MG tablet  Essential hypertension - Plan: amLODipine (NORVASC) 10 MG tablet, potassium chloride SA (KLOR-CON M20) 20 MEQ tablet, hydrochlorothiazide (HYDRODIURIL) 25 MG tablet  Psoriasis  Obesity (BMI 30-39.9) - Plan: CBC with Differential/Platelet, Comprehensive metabolic panel, Lipid panel  Gastroesophageal reflux disease without esophagitis - Plan: nizatidine (AXID) 150 MG capsule  Family history of heart disease in male family member before age 44 - Plan: CBC with Differential/Platelet, Comprehensive metabolic panel, Lipid panel  Hyperlipidemia LDL goal <100 - Plan: Lipid panel  discussed diet and exercise with  him. Encouraged him to incorporate exercise during the day while he is working with 5 minutes in the morning, 10 at lunch and then 5 in the middle of the afternoon. We will also recheck his blood pressure in one month. Recommend he cut back on his Axid use to an as needed basis. Discussed the driving phobia with him. Encouraged him to work on more positive reinforcement thought processes and use Xanax on an as-needed basis. Then see his only driving on Interstate once per month, I do not think that this will become an issue and need to be handled through counseling but did discuss this with him.

## 2016-06-11 ENCOUNTER — Encounter: Payer: Self-pay | Admitting: Cardiology

## 2016-06-11 ENCOUNTER — Encounter: Payer: Self-pay | Admitting: Family Medicine

## 2016-06-11 MED ORDER — ATORVASTATIN CALCIUM 20 MG PO TABS: 20.0000 mg | ORAL_TABLET | Freq: Every day | ORAL | 3 refills | Status: DC

## 2016-06-11 NOTE — Addendum Note (Signed)
Addended by: Denita Lung on: 06/11/2016 09:36 AM   Modules accepted: Orders

## 2016-06-22 ENCOUNTER — Telehealth: Payer: Self-pay | Admitting: Family Medicine

## 2016-06-22 DIAGNOSIS — E785 Hyperlipidemia, unspecified: Secondary | ICD-10-CM

## 2016-06-22 NOTE — Telephone Encounter (Signed)
Pt said he was told to come back in 2 to 2 1/2 months to have cholesterol rechecked. Pt would like to have lab only on 09/03/16 and then come in after results to see Dr Redmond School if needed. Is this OK?

## 2016-06-22 NOTE — Telephone Encounter (Signed)
Order entered and placed pt on schedule

## 2016-06-22 NOTE — Telephone Encounter (Signed)
Set this up 

## 2016-06-28 DIAGNOSIS — M79604 Pain in right leg: Secondary | ICD-10-CM | POA: Diagnosis not present

## 2016-06-28 DIAGNOSIS — M79605 Pain in left leg: Secondary | ICD-10-CM | POA: Diagnosis not present

## 2016-07-05 DIAGNOSIS — M79604 Pain in right leg: Secondary | ICD-10-CM | POA: Diagnosis not present

## 2016-07-08 DIAGNOSIS — I83813 Varicose veins of bilateral lower extremities with pain: Secondary | ICD-10-CM | POA: Diagnosis not present

## 2016-07-21 ENCOUNTER — Encounter: Payer: Self-pay | Admitting: Family Medicine

## 2016-07-21 ENCOUNTER — Ambulatory Visit (INDEPENDENT_AMBULATORY_CARE_PROVIDER_SITE_OTHER): Payer: 59 | Admitting: Family Medicine

## 2016-07-21 VITALS — BP 136/90 | HR 70 | Wt 239.0 lb

## 2016-07-21 DIAGNOSIS — M461 Sacroiliitis, not elsewhere classified: Secondary | ICD-10-CM

## 2016-07-21 NOTE — Progress Notes (Signed)
   Subjective:    Patient ID: Randy [name], male    DOB: [birth year], 51 y.o.   MRN: [id number]  HPI He noted the onset of low back pain after shoveling mulch last Saturday. No numbness, tingling or weakness. He has been using heat, a muscle relaxer and ibuprofen but not in adequate doses.   Review of Systems     Objective:   Physical Exam Normal lumbar curve and motion. Tender to palpation over the upper right SI joint with provocative testing causing him. Negative straight leg raising. Normal hip motion.       Assessment & Plan:  Sacroiliitis ([place]) Amended heat for 20 minutes 3 times per day, gentle stretching after that and 800 mg ibuprofen 3 times a day He will call if further trouble.

## 2016-07-21 NOTE — Patient Instructions (Signed)
4 ibuprofen 3 times per day. Heat for 20 minutes 3 times per day and then stretching after that. Use a muscle relaxer mainly at night

## 2016-08-02 ENCOUNTER — Telehealth: Payer: Self-pay

## 2016-08-02 ENCOUNTER — Encounter: Payer: Self-pay | Admitting: Family Medicine

## 2016-08-02 NOTE — Telephone Encounter (Signed)
Pt called and left VCM that external hemorrhoid popped on Saturday and it is "bleeding quite a bit." Pt questions if you can rx any medication or what should he do next. Thanks, RLB

## 2016-08-02 NOTE — Telephone Encounter (Signed)
Needs an appt

## 2016-08-03 ENCOUNTER — Ambulatory Visit (INDEPENDENT_AMBULATORY_CARE_PROVIDER_SITE_OTHER): Payer: 59 | Admitting: Family Medicine

## 2016-08-03 ENCOUNTER — Encounter: Payer: Self-pay | Admitting: Family Medicine

## 2016-08-03 VITALS — BP 120/78 | HR 77 | Wt 237.0 lb

## 2016-08-03 DIAGNOSIS — K645 Perianal venous thrombosis: Secondary | ICD-10-CM

## 2016-08-03 NOTE — Patient Instructions (Signed)
Ensure you havesoft bowel movements with fluids bulk in your diet and exercise, Avril times per day get in the shower and irrigate that area

## 2016-08-03 NOTE — Progress Notes (Signed)
   Subjective:    Patient ID: Randy [name], male    DOB: April 16, 1966, 51 y.o.   MRN: [id number]  HPI He is here complaining of a several day history of swelling and bleeding in the rectal area. Apparently the symptoms started prior to that but the bleeding occurred just within the last several days. He is now having to wear padding in that area.   Review of Systems     Objective:   Physical Exam Anal exam does show a thrombosed hemorrhoid that has eroded through and is now draining.       Assessment & Plan:  Thrombosed external hemorrhoid  Since the hemorrhoid is already unroofed, I made a small incision into the thrombosed tissue and extruded the blood without difficulty. Recommend irrigating the area several times per day, sure softer BMs with fluids, bulk in diet. Recommend after he has a BM that he wash the area. He will return here if the swelling reoccurs.

## 2016-08-03 NOTE — Telephone Encounter (Signed)
Scheduled for appt today. Randy [name]

## 2016-08-04 ENCOUNTER — Encounter: Payer: Self-pay | Admitting: Family Medicine

## 2016-09-03 ENCOUNTER — Other Ambulatory Visit: Payer: 59

## 2016-09-03 DIAGNOSIS — E785 Hyperlipidemia, unspecified: Secondary | ICD-10-CM

## 2016-09-03 LAB — LIPID PANEL
CHOLESTEROL: 177 mg/dL (ref <200)
HDL: 42 mg/dL (ref >40)
LDL Cholesterol: 115 mg/dL — ABNORMAL HIGH (ref <100)
Total CHOL/HDL Ratio: 4.2 Ratio (ref <5.0)
Triglycerides: 100 mg/dL (ref <150)
VLDL: 20 mg/dL (ref <30)

## 2016-09-06 ENCOUNTER — Telehealth: Payer: Self-pay

## 2016-09-06 ENCOUNTER — Encounter: Payer: Self-pay | Admitting: Cardiology

## 2016-09-06 ENCOUNTER — Encounter: Payer: Self-pay | Admitting: Family Medicine

## 2016-09-06 ENCOUNTER — Other Ambulatory Visit: Payer: Self-pay

## 2016-09-06 MED ORDER — ATORVASTATIN CALCIUM 10 MG PO TABS: 10.0000 mg | ORAL_TABLET | Freq: Every day | ORAL | 3 refills | Status: DC

## 2016-09-06 NOTE — Telephone Encounter (Signed)
[facility]

## 2016-10-18 DIAGNOSIS — N400 Enlarged prostate without lower urinary tract symptoms: Secondary | ICD-10-CM | POA: Diagnosis not present

## 2016-10-25 DIAGNOSIS — N400 Enlarged prostate without lower urinary tract symptoms: Secondary | ICD-10-CM | POA: Diagnosis not present

## 2017-01-11 DIAGNOSIS — M79674 Pain in right toe(s): Secondary | ICD-10-CM | POA: Diagnosis not present

## 2017-01-26 DIAGNOSIS — L821 Other seborrheic keratosis: Secondary | ICD-10-CM | POA: Diagnosis not present

## 2017-01-26 DIAGNOSIS — L400 Psoriasis vulgaris: Secondary | ICD-10-CM | POA: Diagnosis not present

## 2017-01-26 DIAGNOSIS — D229 Melanocytic nevi, unspecified: Secondary | ICD-10-CM | POA: Diagnosis not present

## 2017-03-04 DIAGNOSIS — I8312 Varicose veins of left lower extremity with inflammation: Secondary | ICD-10-CM | POA: Diagnosis not present

## 2017-03-04 DIAGNOSIS — I83892 Varicose veins of left lower extremities with other complications: Secondary | ICD-10-CM | POA: Diagnosis not present

## 2017-03-24 DIAGNOSIS — I83811 Varicose veins of right lower extremities with pain: Secondary | ICD-10-CM | POA: Diagnosis not present

## 2017-04-04 ENCOUNTER — Encounter: Payer: Self-pay | Admitting: Cardiology

## 2017-04-04 NOTE — Progress Notes (Signed)
HPI: FU hypertension. Venous Dopplers in April of 2014 showed venous insufficiency. Lower extremity arterial dopplers in May 2014 normal. Seen by Dr Fletcher Anon in Sept 2014 for venous insuff and initial conservative measures recommended. Venous ablation could be considered if remains symptomatic despite these measures. Stress echocardiogram February 2015 normal. Since last seen, the patient denies any dyspnea on exertion, orthopnea, PND, pedal edema, palpitations, syncope or chest pain.   Current Outpatient Medications  Medication Sig Dispense Refill  . Albuterol Sulfate (PROAIR RESPICLICK) 950 (90 Base) MCG/ACT AEPB Inhale 2 puffs into the lungs 4 (four) times daily as needed. 1 each 0  . ALPRAZolam (XANAX) 0.25 MG tablet Take 1 tablet (0.25 mg total) by mouth 2 (two) times daily as needed for anxiety. 20 tablet 0  . amLODipine (NORVASC) 10 MG tablet Take 1 tablet (10 mg total) by mouth daily. 90 tablet 3  . aspirin 81 MG tablet Take 81 mg by mouth daily.      . hydrochlorothiazide (HYDRODIURIL) 25 MG tablet TAKE 1 TABLET (25 MG TOTAL) BY MOUTH DAILY. 90 tablet 3  . Multiple Vitamin (MULTIVITAMIN WITH MINERALS) TABS Take 1 tablet by mouth daily.    . nizatidine (AXID) 150 MG capsule Take 1 capsule (150 mg total) by mouth daily. 90 capsule 3  . Omega-3 Fatty Acids (FISH OIL PO) Take 2 tablets by mouth daily.     . potassium chloride SA (KLOR-CON M20) 20 MEQ tablet TAKE 1 TABLET (20 MEQ TOTAL) BY MOUTH DAILY. 90 tablet 3   No current facility-administered medications for this visit.      Past Medical History:  Diagnosis Date  . Family history of ischemic heart disease   . GERD (gastroesophageal reflux disease)   . Hypertension   . Obesity   . Psoriasis     Past Surgical History:  Procedure Laterality Date  . CYSTECTOMY     left chest  . superficial vein removed Bilateral   . TOE SURGERY     right great toe    Social History   Socioeconomic History  . Marital status:  Married    Spouse name: Not on file  . Number of children: Not on file  . Years of education: Not on file  . Highest education level: Not on file  Social Needs  . Financial resource strain: Not on file  . Food insecurity - worry: Not on file  . Food insecurity - inability: Not on file  . Transportation needs - medical: Not on file  . Transportation needs - non-medical: Not on file  Occupational History  . Not on file  Tobacco Use  . Smoking status: Never Smoker  . Smokeless tobacco: Never Used  Substance and Sexual Activity  . Alcohol use: Yes    Alcohol/week: 1.5 oz    Types: 3 Standard drinks or equivalent per week  . Drug use: No  . Sexual activity: Yes  Other Topics Concern  . Not on file  Social History Narrative  . Not on file    Family History  Problem Relation Age of Onset  . Heart disease Father   . Hypertension Father   . Heart failure Father   . Heart attack Father 41       both age 105 & 23  . Colon cancer Neg Hx     ROS: no fevers or chills, productive cough, hemoptysis, dysphasia, odynophagia, melena, hematochezia, dysuria, hematuria, rash, seizure activity, orthopnea, PND, pedal edema, claudication. Remaining systems  are negative.  Physical Exam: Well-developed well-nourished in no acute distress.  Skin is warm and dry.  HEENT is normal.  Neck is supple.  Chest is clear to auscultation with normal expansion.  Cardiovascular exam is regular rate and rhythm.  Abdominal exam nontender or distended. No masses palpated. Extremities show no edema. neuro grossly intact  ECG- sinus rhythm with first-degree AV block. No ST changes.personally reviewed  A/P  1 hypertension-blood pressure is mildly elevated. He will follow at home and we will increase meds as needed.  2 hyperlipidemia-Last LDL 4/18 115. Reinforced diet today.  Kirk Ruths, MD

## 2017-04-14 ENCOUNTER — Encounter: Payer: Self-pay | Admitting: Cardiology

## 2017-04-14 ENCOUNTER — Ambulatory Visit: Payer: 59 | Admitting: Cardiology

## 2017-04-14 VITALS — BP 146/72 | HR 73 | Ht 70.5 in | Wt 245.0 lb

## 2017-04-14 DIAGNOSIS — E7800 Pure hypercholesterolemia, unspecified: Secondary | ICD-10-CM | POA: Diagnosis not present

## 2017-04-14 DIAGNOSIS — I10 Essential (primary) hypertension: Secondary | ICD-10-CM | POA: Diagnosis not present

## 2017-04-14 NOTE — Patient Instructions (Signed)
Your physician wants you to follow-up in: ONE YEAR WITH DR CRENSHAW You will receive a reminder letter in the mail two months in advance. If you don't receive a letter, please call our office to schedule the follow-up appointment.   If you need a refill on your cardiac medications before your next appointment, please call your pharmacy.  

## 2017-04-19 DIAGNOSIS — I8311 Varicose veins of right lower extremity with inflammation: Secondary | ICD-10-CM | POA: Diagnosis not present

## 2017-04-19 DIAGNOSIS — I83811 Varicose veins of right lower extremities with pain: Secondary | ICD-10-CM | POA: Diagnosis not present

## 2017-05-16 ENCOUNTER — Ambulatory Visit: Payer: 59 | Admitting: Medical

## 2017-05-16 ENCOUNTER — Encounter: Payer: Self-pay | Admitting: Medical

## 2017-05-16 VITALS — BP 124/80 | Temp 98.2°F | Wt 242.4 lb

## 2017-05-16 DIAGNOSIS — J111 Influenza due to unidentified influenza virus with other respiratory manifestations: Secondary | ICD-10-CM | POA: Diagnosis not present

## 2017-05-16 DIAGNOSIS — J4520 Mild intermittent asthma, uncomplicated: Secondary | ICD-10-CM | POA: Diagnosis not present

## 2017-05-16 DIAGNOSIS — R6889 Other general symptoms and signs: Secondary | ICD-10-CM | POA: Diagnosis not present

## 2017-05-16 LAB — POC INFLUENZA A&B (BINAX/QUICKVUE)
INFLUENZA A, POC: POSITIVE — AB
INFLUENZA B, POC: NEGATIVE

## 2017-05-16 MED ORDER — OSELTAMIVIR PHOSPHATE 75 MG PO CAPS: 75.0000 mg | ORAL_CAPSULE | Freq: Two times a day (BID) | ORAL | 0 refills | Status: DC

## 2017-05-16 NOTE — Patient Instructions (Signed)
You may use Ibuprofen 200mg  OTC, 3-4 tablets up to 3 times daily for the next few days HYDRATE well with clear fluids REST!    Thank you for giving me the opportunity to serve you today.   Your diagnosis today includes: Encounter Diagnosis  Name Primary?  . Flu-like symptoms Yes    Medications prescribed today:   Specific home care recommendations today include:  Only take over-the-counter (OTC) or prescription medicines for pain, discomfort, or fever as directed by your caregiver.    Decongestant: You may use OTC Guaifenesin (Mucinex plain) for congestion.  You may use Pseudoephedrine (Sudafed) only if you don't have blood pressure problems or a diagnosis of hypertension.  Cough suppression: If you have cough from drainage, you may use over-the-counter Dextromethorphan (Delsym) as directed on the label  Sore throat remedies:  You may use salt water gargles, warm fluids such as coffee or hot tea, or honey/tea/lemon mixture to sooth sore throat pain.  You may use OTC sore throat remedies such as Cepacol lozenges or Chloraseptic spray for sore throat pain.  Runny nose and sneezing remedies: You may use OTC antihistamine such as Zyrtec or Benadryl, but caution as these can cause drowsiness.    Pain/fever relief: You may use over-the-counter Tylenol for pain or fever  Drink extra fluids. Fluids help thin the mucus so your sinuses can drain more easily.   Applying either moist heat or ice packs to the sinus areas may help relieve discomfort.  Use saline nasal sprays to help moisten your sinuses. The sprays can be found at your local drugstore.    Please call or return if worse or not improving in the next few days.     I have included other useful information below for your review.   Influenza, Adult Influenza ("the flu") is a viral infection of the respiratory tract. It causes chills, fever, cough, headache, body aches, and sore throat. Influenza in general will make you feel  sicker than when you have a common cold. Symptoms of the illness typically last a few days. Cough and fatigue may continue for as long as 7 to 10 days. Influenza is highly contagious. It spreads easily to others in the droplets from coughs and sneezes. People frequently become infected by touching something that was recently contaminated with the virus and then touch their mouth, nose or eyes. This infection is caused by a virus. Symptoms will not be reduced or improved by taking an antibiotic. Antibiotics are medications that kill bacteria, not viruses. DIAGNOSIS  Diagnosis of influenza is often made based on the history and physical examination as well as the presence of influenza reports occurring in your community. Testing can be done if the diagnosis is not certain. TREATMENT  Since influenza is caused by a virus, antibiotics are not helpful. Your caregiver may prescribe antiviral medicines to shorten the illness and lessen the severity. Your caregiver may also recommend influenza vaccination and/or antiviral medicines for your family members in order to prevent the spread of influenza to them. HOME CARE INSTRUCTIONS  DO NOT GIVE ASPIRIN TO PERSONS WITH INFLUENZA WHO ARE UNDER 42 YEARS OF AGE. This could lead to brain and liver damage (Reye's syndrome). Read the label on over-the-counter medicines.   Stay home from work or school if at all possible until most of your symptoms are gone.   Only take over-the-counter or prescription medicines for pain, discomfort, or fever as directed by your caregiver.   Use a cool mist humidifier to  increase air moisture. This will make breathing easier.   Rest until your temperature is nearly normal: 98.6 F (37 C). This usually takes 3 to 4 days. Be sure you get plenty of rest.   Drink at least eight, eight-ounce glasses of fluids per day. Fluids include water, juice, broth, gelatin, or lemonade.   Cover your mouth and nose when coughing or sneezing and  wash your hands often to prevent the spread of this virus to other persons.  PREVENTION  Annual influenza vaccination (flu shots) is the best way to avoid getting influenza. An annual flu shot is now routinely recommended for all adults in the [place] IF:   You develop shortness of breath while resting.   You have a deep cough with production of mucous or chest pain.   You develop nausea (feeling sick to your stomach), vomiting, or diarrhea.  SEEK IMMEDIATE MEDICAL CARE IF:   You have difficulty breathing, become short of breath, or your skin or nails turn bluish.   You develop severe neck pain or stiffness.   You develop a severe headache, facial pain, or earache.   You have a fever.   You develop nausea or vomiting that cannot be controlled.  Document Released: 04/30/2000 Document Revised: 01/13/2011 Document Reviewed: 03/05/2009 [facility] Patient Information 2012 [place].

## 2017-05-16 NOTE — Progress Notes (Signed)
  Subjective:  Randy [name] is a 51 y.o. male who presents for possible influenza.   Symptoms started 1.5  days ago with fever up to 102, chills, body aches, scratchy throat, headache.  He denies cough, no nausea or vomiting, no back pain, no abdominal pain no shortness of breath no wheezing.  No sick contacts. Rarely has flare ups of asthma.  No other aggravating or relieving factors.  No other c/o.  The following portions of the patient's history were reviewed and updated as appropriate: allergies, current medications, past medical history, past social history and problem list.  ROS as in subjective   Past Medical History:  Diagnosis Date  . Family history of ischemic heart disease   . GERD (gastroesophageal reflux disease)   . Hypertension   . Obesity   . Psoriasis      Objective: BP 124/80   Temp 98.2 F (36.8 C)   Wt 242 lb 6.4 oz (110 kg)   BMI 34.29 kg/m   General:somewhat Ill-appearing, well-developed, well-nourished Skin: Hot, dry HEENT: clear conjunctiva, TMs pearly, no sinus tenderness, no nasal or oropharynx with erythema, no exudates Neck: Supple, non tender, shotty cervical adenopathy Heart: Regular rate and rhythm, normal S1, S2, no murmurs Lungs: Clear to auscultation bilaterally, no wheezes, rales, rhonchi Abdomen: Non tender non distended Extremities: Mild generalized tenderness   Assessment: Encounter Diagnoses  Name Primary?  . Influenza Yes  . Flu-like symptoms   . Mild intermittent asthma without complication      Plan: Prescription given for Tamiflu, discussed risks/benefits of medication.    Discussed diagnosis of influenza. Discussed supportive care including rest, hydration, OTC Tylenol or NSAID for fever, aches, and malaise.  Discussed period of contagion, self quarantine at home away from others to avoid spread of disease, discussed means of transmission, and possible complications including pneumonia.  If worse or not improving within the  next 4-5 days, then call or return.  Patient voiced understanding of diagnosis, recommendations, and treatment plan.   Randy [name] was seen today for fever, bodyaches, chills, heaaches.  Diagnoses and all orders for this visit:  Influenza  Flu-like symptoms -     POC Influenza A&B(BINAX/QUICKVUE)  Mild intermittent asthma without complication  Other orders -     oseltamivir (TAMIFLU) 75 MG capsule; Take 1 capsule (75 mg total) by mouth 2 (two) times daily.   After visit summary given.  Gave note for work.

## 2017-05-29 ENCOUNTER — Other Ambulatory Visit: Payer: Self-pay | Admitting: Family Medicine

## 2017-05-29 DIAGNOSIS — I10 Essential (primary) hypertension: Secondary | ICD-10-CM

## 2017-05-29 DIAGNOSIS — Z0000 Encounter for general adult medical examination without abnormal findings: Secondary | ICD-10-CM

## 2017-06-13 ENCOUNTER — Encounter: Payer: Self-pay | Admitting: Family Medicine

## 2017-06-13 ENCOUNTER — Ambulatory Visit: Payer: 59 | Admitting: Family Medicine

## 2017-06-13 VITALS — BP 140/86 | HR 82 | Ht 70.0 in | Wt 242.8 lb

## 2017-06-13 DIAGNOSIS — J301 Allergic rhinitis due to pollen: Secondary | ICD-10-CM | POA: Diagnosis not present

## 2017-06-13 DIAGNOSIS — K219 Gastro-esophageal reflux disease without esophagitis: Secondary | ICD-10-CM

## 2017-06-13 DIAGNOSIS — I10 Essential (primary) hypertension: Secondary | ICD-10-CM | POA: Diagnosis not present

## 2017-06-13 DIAGNOSIS — E785 Hyperlipidemia, unspecified: Secondary | ICD-10-CM | POA: Diagnosis not present

## 2017-06-13 DIAGNOSIS — J45991 Cough variant asthma: Secondary | ICD-10-CM | POA: Diagnosis not present

## 2017-06-13 DIAGNOSIS — J4520 Mild intermittent asthma, uncomplicated: Secondary | ICD-10-CM | POA: Diagnosis not present

## 2017-06-13 DIAGNOSIS — E669 Obesity, unspecified: Secondary | ICD-10-CM

## 2017-06-13 DIAGNOSIS — Z8249 Family history of ischemic heart disease and other diseases of the circulatory system: Secondary | ICD-10-CM

## 2017-06-13 DIAGNOSIS — L409 Psoriasis, unspecified: Secondary | ICD-10-CM

## 2017-06-13 DIAGNOSIS — Z0000 Encounter for general adult medical examination without abnormal findings: Secondary | ICD-10-CM | POA: Diagnosis not present

## 2017-06-13 LAB — POCT URINALYSIS DIP (PROADVANTAGE DEVICE)
BILIRUBIN UA: NEGATIVE
BILIRUBIN UA: NEGATIVE mg/dL
Blood, UA: NEGATIVE
GLUCOSE UA: NEGATIVE mg/dL
Leukocytes, UA: NEGATIVE
Nitrite, UA: NEGATIVE
PROTEIN UA: NEGATIVE mg/dL
SPECIFIC GRAVITY, URINE: 1.02
Urobilinogen, Ur: 3.5
pH, UA: 6.5 (ref 5.0–8.0)

## 2017-06-13 MED ORDER — AMLODIPINE BESYLATE 10 MG PO TABS: 10.0000 mg | ORAL_TABLET | Freq: Every day | ORAL | 3 refills | Status: DC

## 2017-06-13 MED ORDER — HYDROCHLOROTHIAZIDE 25 MG PO TABS: ORAL_TABLET | ORAL | 0 refills | Status: DC

## 2017-06-13 MED ORDER — ALBUTEROL SULFATE 108 (90 BASE) MCG/ACT IN AEPB: 2.0000 | INHALATION_SPRAY | Freq: Four times a day (QID) | RESPIRATORY_TRACT | 0 refills | Status: DC | PRN

## 2017-06-13 NOTE — Progress Notes (Signed)
Subjective:    Patient ID: Randy [name], male    DOB: 1965-08-19, 52 y.o.   MRN: [id number]  HPI He is here for a complete examination.  He was recently seen by cardiology.  His blood pressure was discussed.  He continues on his present medication regimen.  He does have a blood pressure cuff at home but has not been checking it to know what kind of readings he is getting.  Apparently the cuff is accurate.  He also has an underlying history of hyperlipidemia.  He is presently not on a statin drug.  His recent numbers are okay and not necessarily in range of needing to be treated with a statin.  He would like to avoid this if he can.  He does have underlying allergies and intermittent asthma.  He does use Claritin and occasionally use albuterol.  He uses axid for his reflux disease and has this under fairly good control.  He does have underlying psoriasis but is having no difficulty with that.   Review of Systems  All other systems reviewed and are negative.      Objective:   Physical Exam BP 140/86 (BP Location: Left Arm, Patient Position: Sitting)   Pulse 82   Ht 5\' 10"  (1.778 m)   Wt 242 lb 12.8 oz (110.1 kg)   SpO2 98%   BMI 34.84 kg/m   General Appearance:    Alert, cooperative, no distress, appears stated age  Head:    Normocephalic, without obvious abnormality, atraumatic  Eyes:    PERRL, conjunctiva/corneas clear, EOM's intact, fundi    benign  Ears:    Normal TM's and external ear canals  Nose:   Nares normal, mucosa normal, no drainage or sinus   tenderness  Throat:   Lips, mucosa, and tongue normal; teeth and gums normal  Neck:   Supple, no lymphadenopathy;  thyroid:  no   enlargement/tenderness/nodules; no carotid   bruit or JVD     Lungs:     Clear to auscultation bilaterally without wheezes, rales or     ronchi; respirations unlabored      Heart:    Regular rate and rhythm, S1 and S2 normal, no murmur, rub   or gallop     Abdomen:     Soft, non-tender,  nondistended, normoactive bowel sounds,    no masses, no hepatosplenomegaly  Genitalia:   deferred  Rectal:   Deferred  Extremities:   No clubbing, cyanosis or edema  Pulses:   2+ and symmetric all extremities  Skin:   Skin color, texture, turgor normal, psoriatic lesion noted on right knee.  Lymph nodes:   Cervical, supraclavicular, and axillary nodes normal  Neurologic:   CNII-XII intact, normal strength, sensation and gait; reflexes 2+ and symmetric throughout          Psych:   Normal mood, affect, hygiene and grooming.           Assessment & Plan:  Routine medical exam - Plan: POCT Urinalysis DIP (Proadvantage Device), CBC with Differential/Platelet, Comprehensive metabolic panel, Lipid panel  Mild intermittent asthma without complication  Hyperlipidemia, unspecified hyperlipidemia type - Plan: Lipid panel  Essential hypertension - Plan: CBC with Differential/Platelet, Comprehensive metabolic panel, amLODipine (NORVASC) 10 MG tablet, hydrochlorothiazide (HYDRODIURIL) 25 MG tablet  Family history of heart disease in male family member before age 39 - Plan: CBC with Differential/Platelet, Comprehensive metabolic panel, Lipid panel  Psoriasis  Obesity (BMI 30-39.9) - Plan: CBC with Differential/Platelet, Comprehensive metabolic panel,  Lipid panel  Gastroesophageal reflux disease without esophagitis  Seasonal allergic rhinitis due to pollen  Routine general medical examination at a health care facility - Plan: amLODipine (NORVASC) 10 MG tablet  Cough variant asthma - Plan: Albuterol Sulfate (PROAIR RESPICLICK) 409 (90 Base) MCG/ACT AEPB Recommend he switch to Flonase to help with his allergy symptoms and then Claritin if need be.  I will renew his albuterol.  I will also continue him on his present blood pressure medications.  He is to check his blood pressure and send in the results to me through My Chart .  I then discussed his cholesterol numbers with him.  He at this point  is not interested in going on a statin drug.  I explained risk stratification to him in regard to family history, blood pressure, diabetes, smoking. Did encourage him to spend 20 minutes/day exercising more short of 150 minutes/week.

## 2017-06-13 NOTE — Patient Instructions (Signed)
20 minutes of something physical on a daily basis or a total of 150 minutes a week of something physical .

## 2017-06-14 ENCOUNTER — Encounter: Payer: Self-pay | Admitting: Family Medicine

## 2017-06-14 LAB — CBC WITH DIFFERENTIAL/PLATELET
BASOS: 1 %
Basophils Absolute: 0 10*3/uL (ref 0.0–0.2)
EOS (ABSOLUTE): 0.1 10*3/uL (ref 0.0–0.4)
EOS: 2 %
HEMATOCRIT: 45.1 % (ref 37.5–51.0)
HEMOGLOBIN: 15.7 g/dL (ref 13.0–17.7)
Immature Grans (Abs): 0 10*3/uL (ref 0.0–0.1)
Immature Granulocytes: 0 %
LYMPHS ABS: 2.1 10*3/uL (ref 0.7–3.1)
Lymphs: 40 %
MCH: 30.9 pg (ref 26.6–33.0)
MCHC: 34.8 g/dL (ref 31.5–35.7)
MCV: 89 fL (ref 79–97)
MONOCYTES: 7 %
Monocytes Absolute: 0.4 10*3/uL (ref 0.1–0.9)
NEUTROS ABS: 2.7 10*3/uL (ref 1.4–7.0)
Neutrophils: 50 %
Platelets: 281 10*3/uL (ref 150–379)
RBC: 5.08 x10E6/uL (ref 4.14–5.80)
RDW: 14 % (ref 12.3–15.4)
WBC: 5.4 10*3/uL (ref 3.4–10.8)

## 2017-06-14 LAB — COMPREHENSIVE METABOLIC PANEL
ALBUMIN: 4.8 g/dL (ref 3.5–5.5)
ALT: 44 IU/L (ref 0–44)
AST: 28 IU/L (ref 0–40)
Albumin/Globulin Ratio: 2.1 (ref 1.2–2.2)
Alkaline Phosphatase: 70 IU/L (ref 39–117)
BILIRUBIN TOTAL: 0.5 mg/dL (ref 0.0–1.2)
BUN / CREAT RATIO: 12 (ref 9–20)
BUN: 13 mg/dL (ref 6–24)
CO2: 25 mmol/L (ref 20–29)
Calcium: 9.5 mg/dL (ref 8.7–10.2)
Chloride: 100 mmol/L (ref 96–106)
Creatinine, Ser: 1.11 mg/dL (ref 0.76–1.27)
GFR calc non Af Amer: 76 mL/min/{1.73_m2} (ref >59)
GFR, EST AFRICAN AMERICAN: 88 mL/min/{1.73_m2} (ref >59)
GLOBULIN, TOTAL: 2.3 g/dL (ref 1.5–4.5)
Glucose: 105 mg/dL — ABNORMAL HIGH (ref 65–99)
Potassium: 4.1 mmol/L (ref 3.5–5.2)
SODIUM: 142 mmol/L (ref 134–144)
TOTAL PROTEIN: 7.1 g/dL (ref 6.0–8.5)

## 2017-06-14 LAB — LIPID PANEL
CHOLESTEROL TOTAL: 199 mg/dL (ref 100–199)
Chol/HDL Ratio: 4.3 ratio (ref 0.0–5.0)
HDL: 46 mg/dL (ref >39)
LDL CALC: 136 mg/dL — AB (ref 0–99)
Triglycerides: 87 mg/dL (ref 0–149)
VLDL Cholesterol Cal: 17 mg/dL (ref 5–40)

## 2017-06-19 ENCOUNTER — Other Ambulatory Visit: Payer: Self-pay | Admitting: Family Medicine

## 2017-06-19 DIAGNOSIS — I10 Essential (primary) hypertension: Secondary | ICD-10-CM

## 2017-07-31 ENCOUNTER — Other Ambulatory Visit: Payer: Self-pay | Admitting: Family Medicine

## 2017-07-31 DIAGNOSIS — K219 Gastro-esophageal reflux disease without esophagitis: Secondary | ICD-10-CM

## 2017-08-08 ENCOUNTER — Other Ambulatory Visit: Payer: Self-pay

## 2017-08-08 ENCOUNTER — Telehealth: Payer: Self-pay | Admitting: Family Medicine

## 2017-08-08 DIAGNOSIS — K219 Gastro-esophageal reflux disease without esophagitis: Secondary | ICD-10-CM

## 2017-08-08 MED ORDER — NIZATIDINE 150 MG PO CAPS: 150.0000 mg | ORAL_CAPSULE | Freq: Every day | ORAL | 3 refills | Status: DC

## 2017-08-08 NOTE — Telephone Encounter (Signed)
New Message   *STAT* If patient is at the pharmacy, call can be transferred to refill team.   1. Which medications need to be refilled? (please list name of each medication and dose if known)  Nizatidine 150 mg capsule once daily, this medication is on longterm backorder and requesting an alternative  2. Which pharmacy/location (including street and city if local pharmacy) is medication to be sent to? [id number] - [place], [place] - [address] AT [address]  3. Do they need a 30 day or 90 day supply?  90 day supply

## 2017-08-16 ENCOUNTER — Other Ambulatory Visit: Payer: Self-pay

## 2017-08-16 ENCOUNTER — Telehealth: Payer: Self-pay | Admitting: Family Medicine

## 2017-08-16 DIAGNOSIS — Z8249 Family history of ischemic heart disease and other diseases of the circulatory system: Secondary | ICD-10-CM

## 2017-08-16 DIAGNOSIS — E785 Hyperlipidemia, unspecified: Secondary | ICD-10-CM

## 2017-08-16 NOTE — Telephone Encounter (Signed)
DONE

## 2017-08-16 NOTE — Telephone Encounter (Signed)
Patient called and wants to have his cholesterol checked on May 3rd to see if any better.  He said you ok'd it already.  If you are ok with this, please put in orders for 09/16/17.

## 2017-08-19 ENCOUNTER — Encounter: Payer: 59 | Admitting: Family Medicine

## 2017-09-10 ENCOUNTER — Other Ambulatory Visit: Payer: Self-pay | Admitting: Family Medicine

## 2017-09-10 DIAGNOSIS — I10 Essential (primary) hypertension: Secondary | ICD-10-CM

## 2017-09-16 ENCOUNTER — Other Ambulatory Visit: Payer: 59

## 2017-09-16 DIAGNOSIS — Z8249 Family history of ischemic heart disease and other diseases of the circulatory system: Secondary | ICD-10-CM

## 2017-09-16 DIAGNOSIS — E785 Hyperlipidemia, unspecified: Secondary | ICD-10-CM

## 2017-09-17 LAB — LIPID PANEL
CHOLESTEROL TOTAL: 173 mg/dL (ref 100–199)
Chol/HDL Ratio: 4.3 ratio (ref 0.0–5.0)
HDL: 40 mg/dL (ref >39)
LDL Calculated: 115 mg/dL — ABNORMAL HIGH (ref 0–99)
TRIGLYCERIDES: 89 mg/dL (ref 0–149)
VLDL Cholesterol Cal: 18 mg/dL (ref 5–40)

## 2017-10-14 ENCOUNTER — Other Ambulatory Visit: Payer: Self-pay | Admitting: Family Medicine

## 2017-10-14 DIAGNOSIS — J45991 Cough variant asthma: Secondary | ICD-10-CM

## 2017-10-14 NOTE — Telephone Encounter (Signed)
[facility] PRO[facility]IR. PLEASE  ADVISE . Randy [name]

## 2017-12-01 ENCOUNTER — Other Ambulatory Visit: Payer: Self-pay | Admitting: Family Medicine

## 2017-12-01 DIAGNOSIS — I10 Essential (primary) hypertension: Secondary | ICD-10-CM

## 2017-12-09 ENCOUNTER — Other Ambulatory Visit: Payer: Self-pay | Admitting: Family Medicine

## 2017-12-09 DIAGNOSIS — I10 Essential (primary) hypertension: Secondary | ICD-10-CM

## 2018-02-13 ENCOUNTER — Other Ambulatory Visit: Payer: Self-pay | Admitting: Family Medicine

## 2018-02-13 DIAGNOSIS — J45991 Cough variant asthma: Secondary | ICD-10-CM

## 2018-02-13 NOTE — Telephone Encounter (Signed)
Is this ok to refill?  

## 2018-02-25 ENCOUNTER — Other Ambulatory Visit: Payer: Self-pay | Admitting: Family Medicine

## 2018-02-25 DIAGNOSIS — I10 Essential (primary) hypertension: Secondary | ICD-10-CM

## 2018-03-23 NOTE — Progress Notes (Signed)
HPI: FU hypertension. Venous Dopplers in April of 2014 showed venous insufficiency. Lower extremity arterial dopplers in May 2014 normal. Seen by Dr Fletcher Anon in Sept 2014 for venous insuff and initial conservative measures recommended. Venous ablation could be considered if remains symptomatic despite these measures. Stress echocardiogram February 2015 normal. Since last seen,the patient denies any dyspnea on exertion, orthopnea, PND, pedal edema, palpitations, syncope or chest pain.   Current Outpatient Medications  Medication Sig Dispense Refill  . amLODipine (NORVASC) 10 MG tablet Take 1 tablet (10 mg total) by mouth daily. 90 tablet 3  . aspirin 81 MG tablet Take 81 mg by mouth daily.      . hydrochlorothiazide (HYDRODIURIL) 25 MG tablet TAKE 1 TABLET BY MOUTH EVERY DAY 90 tablet 0  . Multiple Vitamin (MULTIVITAMIN WITH MINERALS) TABS Take 1 tablet by mouth daily.    . nizatidine (AXID) 150 MG capsule Take 1 capsule (150 mg total) by mouth daily. 90 capsule 3  . Omega-3 Fatty Acids (FISH OIL PO) Take 2 tablets by mouth daily.     . potassium chloride SA (KLOR-CON M20) 20 MEQ tablet TAKE 1 TABLET BY MOUTH EVERY DAY 90 tablet 1  . PROAIR RESPICLICK 673 (90 Base) MCG/ACT AEPB INHALE 2 PUFFS INTO THE LUNGS 4 (FOUR) TIMES DAILY AS NEEDED. 1 each 0   No current facility-administered medications for this visit.      Past Medical History:  Diagnosis Date  . Family history of ischemic heart disease   . GERD (gastroesophageal reflux disease)   . Hypertension   . Obesity   . Psoriasis     Past Surgical History:  Procedure Laterality Date  . CYSTECTOMY     left chest  . superficial vein removed Bilateral   . TOE SURGERY     right great toe    Social History   Socioeconomic History  . Marital status: Married    Spouse name: Not on file  . Number of children: Not on file  . Years of education: Not on file  . Highest education level: Not on file  Occupational History  . Not  on file  Social Needs  . Financial resource strain: Not on file  . Food insecurity:    Worry: Not on file    Inability: Not on file  . Transportation needs:    Medical: Not on file    Non-medical: Not on file  Tobacco Use  . Smoking status: Never Smoker  . Smokeless tobacco: Never Used  Substance and Sexual Activity  . Alcohol use: Yes    Alcohol/week: 3.0 standard drinks    Types: 3 Standard drinks or equivalent per week  . Drug use: No  . Sexual activity: Yes  Lifestyle  . Physical activity:    Days per week: Not on file    Minutes per session: Not on file  . Stress: Not on file  Relationships  . Social connections:    Talks on phone: Not on file    Gets together: Not on file    Attends religious service: Not on file    Active member of club or organization: Not on file    Attends meetings of clubs or organizations: Not on file    Relationship status: Not on file  . Intimate partner violence:    Fear of current or ex partner: Not on file    Emotionally abused: Not on file    Physically abused: Not on file  Forced sexual activity: Not on file  Other Topics Concern  . Not on file  Social History Narrative  . Not on file    Family History  Problem Relation Age of Onset  . Heart disease Father   . Hypertension Father   . Heart failure Father   . Heart attack Father 26       both age 75 & 35  . Colon cancer Neg Hx     ROS: no fevers or chills, productive cough, hemoptysis, dysphasia, odynophagia, melena, hematochezia, dysuria, hematuria, rash, seizure activity, orthopnea, PND, pedal edema, claudication. Remaining systems are negative.  Physical Exam: Well-developed well-nourished in no acute distress.  Skin is warm and dry.  HEENT is normal.  Neck is supple.  Chest is clear to auscultation with normal expansion.  Cardiovascular exam is regular rate and rhythm.  Abdominal exam nontender or distended. No masses palpated. Extremities show no edema. neuro  grossly intact  ECG-normal sinus rhythm at a rate of 75.  No ST changes.  Personally reviewed  A/P  1 hypertension-pressure is mildly elevated.  I have asked him to follow this at home and we will add additional medications as needed.  2 hyperlipidemia-last LDL 115 in May 2019.  Plan to continue diet.  We discussed importance of diet and exercise.  Kirk Ruths, MD

## 2018-04-03 ENCOUNTER — Encounter: Payer: Self-pay | Admitting: Cardiology

## 2018-04-03 ENCOUNTER — Ambulatory Visit: Payer: 59 | Admitting: Cardiology

## 2018-04-03 VITALS — BP 148/86 | HR 75 | Ht 70.0 in | Wt 250.4 lb

## 2018-04-03 DIAGNOSIS — I10 Essential (primary) hypertension: Secondary | ICD-10-CM

## 2018-04-03 DIAGNOSIS — E7800 Pure hypercholesterolemia, unspecified: Secondary | ICD-10-CM

## 2018-04-03 NOTE — Patient Instructions (Signed)

## 2018-05-29 ENCOUNTER — Other Ambulatory Visit: Payer: Self-pay | Admitting: Family Medicine

## 2018-05-29 DIAGNOSIS — I10 Essential (primary) hypertension: Secondary | ICD-10-CM

## 2018-06-03 ENCOUNTER — Other Ambulatory Visit: Payer: Self-pay | Admitting: Family Medicine

## 2018-06-03 DIAGNOSIS — I10 Essential (primary) hypertension: Secondary | ICD-10-CM

## 2018-06-05 ENCOUNTER — Other Ambulatory Visit: Payer: Self-pay

## 2018-06-05 DIAGNOSIS — Z0000 Encounter for general adult medical examination without abnormal findings: Secondary | ICD-10-CM

## 2018-06-05 DIAGNOSIS — I10 Essential (primary) hypertension: Secondary | ICD-10-CM

## 2018-06-05 MED ORDER — AMLODIPINE BESYLATE 10 MG PO TABS: 10.0000 mg | ORAL_TABLET | Freq: Every day | ORAL | 3 refills | Status: DC

## 2018-06-14 ENCOUNTER — Encounter: Payer: Self-pay | Admitting: Family Medicine

## 2018-06-14 ENCOUNTER — Ambulatory Visit: Payer: 59 | Admitting: Family Medicine

## 2018-06-14 VITALS — BP 138/84 | HR 86 | Temp 97.8°F | Ht 70.0 in | Wt 248.2 lb

## 2018-06-14 DIAGNOSIS — L409 Psoriasis, unspecified: Secondary | ICD-10-CM

## 2018-06-14 DIAGNOSIS — E785 Hyperlipidemia, unspecified: Secondary | ICD-10-CM | POA: Diagnosis not present

## 2018-06-14 DIAGNOSIS — J4520 Mild intermittent asthma, uncomplicated: Secondary | ICD-10-CM | POA: Diagnosis not present

## 2018-06-14 DIAGNOSIS — Z125 Encounter for screening for malignant neoplasm of prostate: Secondary | ICD-10-CM

## 2018-06-14 DIAGNOSIS — R9431 Abnormal electrocardiogram [ECG] [EKG]: Secondary | ICD-10-CM

## 2018-06-14 DIAGNOSIS — Z8249 Family history of ischemic heart disease and other diseases of the circulatory system: Secondary | ICD-10-CM

## 2018-06-14 DIAGNOSIS — Z0000 Encounter for general adult medical examination without abnormal findings: Secondary | ICD-10-CM | POA: Diagnosis not present

## 2018-06-14 DIAGNOSIS — J301 Allergic rhinitis due to pollen: Secondary | ICD-10-CM

## 2018-06-14 DIAGNOSIS — E669 Obesity, unspecified: Secondary | ICD-10-CM | POA: Diagnosis not present

## 2018-06-14 DIAGNOSIS — M79671 Pain in right foot: Secondary | ICD-10-CM

## 2018-06-14 DIAGNOSIS — K219 Gastro-esophageal reflux disease without esophagitis: Secondary | ICD-10-CM

## 2018-06-14 DIAGNOSIS — I10 Essential (primary) hypertension: Secondary | ICD-10-CM

## 2018-06-14 LAB — POCT URINALYSIS DIP (PROADVANTAGE DEVICE)
BILIRUBIN UA: NEGATIVE
Blood, UA: NEGATIVE
Glucose, UA: NEGATIVE mg/dL
Ketones, POC UA: NEGATIVE mg/dL
Leukocytes, UA: NEGATIVE
Nitrite, UA: NEGATIVE
Protein Ur, POC: NEGATIVE mg/dL
Specific Gravity, Urine: 1.015
Urobilinogen, Ur: 3.5
pH, UA: 7 (ref 5.0–8.0)

## 2018-06-14 NOTE — Progress Notes (Signed)
Subjective:    Patient ID: Randy [name], male    DOB: [birth year], 53 y.o.   MRN: [id number]  HPI He is here for complete examination.  He was seen recently by a podiatrist and evaluated for heel pain.  He would like further evaluation of this.  It is causing some difficulty with physical activity.  He does have a previous history of hematospermia and has seen urology in the past.  Apparently his work-up was negative.  He does get routine PSA testing done.  He does have mild intermittent asthma and does use pro-air on an as-needed basis.  He continues on HCTZ as well as amlodipine for his blood pressure.  His allergies seem to be under good control.  He treats his reflux with Axid.  He does have a previous history of abnormal EKG however was evaluated by cardiology and nothing of major significance was found.  He is monitoring his blood pressure readings fairly regularly.  His machine at home is accurate.  He does have psoriasis but has no complaints.  Family and social history as well as health maintenance and immunizations was reviewed   Review of Systems  All other systems reviewed and are negative.      Objective:   Physical Exam BP 138/84 (BP Location: Left Arm, Patient Position: Sitting)   Pulse 86   Temp 97.8 F (36.6 C)   Ht 5\' 10"  (1.778 m)   Wt 248 lb 3.2 oz (112.6 kg)   SpO2 98%   BMI 35.61 kg/m   General Appearance:    Alert, cooperative, no distress, appears stated age  Head:    Normocephalic, without obvious abnormality, atraumatic  Eyes:    PERRL, conjunctiva/corneas clear, EOM's intact, fundi    benign  Ears:    Normal TM's and external ear canals  Nose:   Nares normal, mucosa normal, no drainage or sinus   tenderness  Throat:   Lips, mucosa, and tongue normal; teeth and gums normal  Neck:   Supple, no lymphadenopathy;  thyroid:  no   enlargement/tenderness/nodules; no carotid   bruit or JVD     Lungs:     Clear to auscultation bilaterally without wheezes, rales  or     ronchi; respirations unlabored  Chest Wall:    No tenderness or deformity   Heart:    Regular rate and rhythm, S1 and S2 normal, no murmur, rub   or gallop     Abdomen:     Soft, non-tender, nondistended, normoactive bowel sounds,    no masses, no hepatosplenomegaly  Genitalia:   Deferred  Rectal:   Deferred  Extremities:   No clubbing, cyanosis or edema.  Bony abnormality noted at the insertion of the Achilles tendon that is slightly tender to palpation.  Pulses:   2+ and symmetric all extremities  Skin:   Skin color, texture, turgor normal, no rashes or lesions  Lymph nodes:   Cervical, supraclavicular, and axillary nodes normal  Neurologic:   CNII-XII intact, normal strength, sensation and gait; reflexes 2+ and symmetric throughout          Psych:   Normal mood, affect, hygiene and grooming.         Assessment & Plan:  Routine medical exam - Plan: CBC with Differential/Platelet, Comprehensive metabolic panel, Lipid panel, POCT Urinalysis DIP (Proadvantage Device)  Hyperlipidemia LDL goal <100 - Plan: Lipid panel  Family history of heart disease in male family member before age 8  Mild intermittent asthma  without complication  Obesity (BMI 30-39.9)  Psoriasis  Seasonal allergic rhinitis due to pollen  Gastroesophageal reflux disease without esophagitis  Essential hypertension - Plan: CBC with Differential/Platelet, Comprehensive metabolic panel, Lipid panel  Nonspecific abnormal electrocardiogram (ECG) (EKG) - Plan: CBC with Differential/Platelet, Comprehensive metabolic panel, Lipid panel  Screening for prostate cancer - Plan: PSA  Pain of right heel Recommend conservative care for the heel pain with padding on either side of the Achilles tendon to take some the pressure off that.  If continued difficulty recommend he be seen by a foot specialist. He will continue on his present medications. Discussed diet and exercise with him in regard to helping his blood  pressure.

## 2018-06-14 NOTE — Patient Instructions (Signed)
Check out the DASH diet 

## 2018-06-15 ENCOUNTER — Encounter: Payer: Self-pay | Admitting: Family Medicine

## 2018-06-15 LAB — COMPREHENSIVE METABOLIC PANEL
ALBUMIN: 4.6 g/dL (ref 3.8–4.9)
ALT: 28 IU/L (ref 0–44)
AST: 18 IU/L (ref 0–40)
Albumin/Globulin Ratio: 2.1 (ref 1.2–2.2)
Alkaline Phosphatase: 67 IU/L (ref 39–117)
BUN / CREAT RATIO: 10 (ref 9–20)
BUN: 12 mg/dL (ref 6–24)
Bilirubin Total: 0.8 mg/dL (ref 0.0–1.2)
CO2: 23 mmol/L (ref 20–29)
CREATININE: 1.22 mg/dL (ref 0.76–1.27)
Calcium: 9.4 mg/dL (ref 8.7–10.2)
Chloride: 100 mmol/L (ref 96–106)
GFR calc non Af Amer: 67 mL/min/{1.73_m2} (ref >59)
GFR, EST AFRICAN AMERICAN: 78 mL/min/{1.73_m2} (ref >59)
GLUCOSE: 108 mg/dL — AB (ref 65–99)
Globulin, Total: 2.2 g/dL (ref 1.5–4.5)
Potassium: 3.8 mmol/L (ref 3.5–5.2)
Sodium: 139 mmol/L (ref 134–144)
Total Protein: 6.8 g/dL (ref 6.0–8.5)

## 2018-06-15 LAB — CBC WITH DIFFERENTIAL/PLATELET
Basophils Absolute: 0.1 10*3/uL (ref 0.0–0.2)
Basos: 1 %
EOS (ABSOLUTE): 0.1 10*3/uL (ref 0.0–0.4)
EOS: 1 %
HEMOGLOBIN: 15.3 g/dL (ref 13.0–17.7)
Hematocrit: 43.9 % (ref 37.5–51.0)
Immature Grans (Abs): 0 10*3/uL (ref 0.0–0.1)
Immature Granulocytes: 0 %
LYMPHS ABS: 1.9 10*3/uL (ref 0.7–3.1)
Lymphs: 25 %
MCH: 30.2 pg (ref 26.6–33.0)
MCHC: 34.9 g/dL (ref 31.5–35.7)
MCV: 87 fL (ref 79–97)
MONOCYTES: 6 %
MONOS ABS: 0.5 10*3/uL (ref 0.1–0.9)
Neutrophils Absolute: 5.1 10*3/uL (ref 1.4–7.0)
Neutrophils: 67 %
Platelets: 322 10*3/uL (ref 150–450)
RBC: 5.06 x10E6/uL (ref 4.14–5.80)
RDW: 13.1 % (ref 11.6–15.4)
WBC: 7.7 10*3/uL (ref 3.4–10.8)

## 2018-06-15 LAB — LIPID PANEL
CHOLESTEROL TOTAL: 191 mg/dL (ref 100–199)
Chol/HDL Ratio: 4.7 ratio (ref 0.0–5.0)
HDL: 41 mg/dL (ref >39)
LDL CALC: 120 mg/dL — AB (ref 0–99)
Triglycerides: 152 mg/dL — ABNORMAL HIGH (ref 0–149)
VLDL CHOLESTEROL CAL: 30 mg/dL (ref 5–40)

## 2018-06-15 LAB — PSA: PROSTATE SPECIFIC AG, SERUM: 0.8 ng/mL (ref 0.0–4.0)

## 2018-06-15 MED ORDER — ROSUVASTATIN CALCIUM 20 MG PO TABS: 20.0000 mg | ORAL_TABLET | Freq: Every day | ORAL | 3 refills | Status: DC

## 2018-06-15 NOTE — Addendum Note (Signed)
Addended by: Denita Lung on: 06/15/2018 03:47 PM   Modules accepted: Orders

## 2018-08-06 ENCOUNTER — Encounter: Payer: Self-pay | Admitting: Family Medicine

## 2018-08-18 ENCOUNTER — Encounter: Payer: Self-pay | Admitting: Family Medicine

## 2018-08-18 ENCOUNTER — Ambulatory Visit: Payer: Self-pay | Admitting: Family Medicine

## 2018-08-20 ENCOUNTER — Other Ambulatory Visit: Payer: Self-pay | Admitting: Family Medicine

## 2018-08-20 DIAGNOSIS — I10 Essential (primary) hypertension: Secondary | ICD-10-CM

## 2018-08-21 ENCOUNTER — Other Ambulatory Visit: Payer: Self-pay | Admitting: Family Medicine

## 2018-08-21 DIAGNOSIS — K219 Gastro-esophageal reflux disease without esophagitis: Secondary | ICD-10-CM

## 2018-09-01 ENCOUNTER — Ambulatory Visit: Payer: Self-pay | Admitting: Family Medicine

## 2018-09-29 ENCOUNTER — Other Ambulatory Visit: Payer: Self-pay

## 2018-09-29 ENCOUNTER — Other Ambulatory Visit: Payer: 59

## 2018-09-29 DIAGNOSIS — E785 Hyperlipidemia, unspecified: Secondary | ICD-10-CM

## 2018-09-30 LAB — LIPID PANEL
Chol/HDL Ratio: 4.8 ratio (ref 0.0–5.0)
Cholesterol, Total: 191 mg/dL (ref 100–199)
HDL: 40 mg/dL (ref >39)
LDL Calculated: 117 mg/dL — ABNORMAL HIGH (ref 0–99)
Triglycerides: 171 mg/dL — ABNORMAL HIGH (ref 0–149)
VLDL Cholesterol Cal: 34 mg/dL (ref 5–40)

## 2018-10-03 ENCOUNTER — Encounter: Payer: Self-pay | Admitting: Family Medicine

## 2018-10-03 DIAGNOSIS — E785 Hyperlipidemia, unspecified: Secondary | ICD-10-CM

## 2018-10-03 MED ORDER — ROSUVASTATIN CALCIUM 20 MG PO TABS: 20.0000 mg | ORAL_TABLET | Freq: Every day | ORAL | 3 refills | Status: DC

## 2018-10-13 ENCOUNTER — Encounter: Payer: Self-pay | Admitting: Family Medicine

## 2018-11-21 ENCOUNTER — Encounter: Payer: Self-pay | Admitting: Family Medicine

## 2018-11-27 ENCOUNTER — Telehealth: Payer: Self-pay

## 2018-11-27 NOTE — Telephone Encounter (Signed)
Have him try Nexium

## 2018-11-27 NOTE — Telephone Encounter (Signed)
Please advise due to Nizatidine 150 mg is on back order. Please send in a replacement or advise [facility]

## 2018-11-28 NOTE — Telephone Encounter (Signed)
Pt advised. KH 

## 2018-12-04 ENCOUNTER — Other Ambulatory Visit: Payer: Self-pay | Admitting: Family Medicine

## 2018-12-04 DIAGNOSIS — I10 Essential (primary) hypertension: Secondary | ICD-10-CM

## 2018-12-10 ENCOUNTER — Other Ambulatory Visit: Payer: Self-pay | Admitting: Medical

## 2018-12-10 DIAGNOSIS — I10 Essential (primary) hypertension: Secondary | ICD-10-CM

## 2018-12-15 ENCOUNTER — Telehealth: Payer: Self-pay

## 2018-12-15 ENCOUNTER — Other Ambulatory Visit: Payer: Self-pay

## 2018-12-15 ENCOUNTER — Other Ambulatory Visit: Payer: 59

## 2018-12-15 DIAGNOSIS — E785 Hyperlipidemia, unspecified: Secondary | ICD-10-CM

## 2018-12-15 LAB — LIPID PANEL
Chol/HDL Ratio: 2.6 ratio (ref 0.0–5.0)
Cholesterol, Total: 110 mg/dL (ref 100–199)
HDL: 43 mg/dL (ref >39)
LDL Calculated: 47 mg/dL (ref 0–99)
Triglycerides: 98 mg/dL (ref 0–149)
VLDL Cholesterol Cal: 20 mg/dL (ref 5–40)

## 2018-12-15 NOTE — Telephone Encounter (Signed)
Patient is here for fasting labs and no orders are put in.   All I see is for lipid panel.  Is that all you wanted?

## 2018-12-15 NOTE — Telephone Encounter (Signed)
yes

## 2018-12-17 ENCOUNTER — Encounter: Payer: Self-pay | Admitting: Family Medicine

## 2019-01-26 ENCOUNTER — Other Ambulatory Visit (INDEPENDENT_AMBULATORY_CARE_PROVIDER_SITE_OTHER): Payer: 59

## 2019-01-26 ENCOUNTER — Other Ambulatory Visit: Payer: Self-pay

## 2019-01-26 ENCOUNTER — Telehealth: Payer: Self-pay | Admitting: Internal Medicine

## 2019-01-26 DIAGNOSIS — Z23 Encounter for immunization: Secondary | ICD-10-CM

## 2019-01-26 NOTE — Telephone Encounter (Signed)
Pt was here for a flu shot and asked about getting shingles and wanted to know should he get it?

## 2019-01-26 NOTE — Telephone Encounter (Signed)
Pt will call insurance and let us know.

## 2019-01-26 NOTE — Telephone Encounter (Signed)
He needs to check with his insurance to see if it is covered.  Let him know that usually it is done at age 53

## 2019-03-08 ENCOUNTER — Other Ambulatory Visit: Payer: Self-pay | Admitting: Medical

## 2019-03-08 DIAGNOSIS — I10 Essential (primary) hypertension: Secondary | ICD-10-CM

## 2019-04-18 NOTE — Progress Notes (Signed)
HPI: FU hypertension. Venous Dopplers in April of 2014 showed venous insufficiency. Lower extremity arterial dopplers in May 2014 normal. Seen by Dr Fletcher Anon in Sept 2014 for venous insuff and initial conservative measures recommended. Venous ablation could be considered if remains symptomatic despite these measures. Stress echocardiogram February 2015 normal. Since last seen,the patient denies any dyspnea on exertion, orthopnea, PND, pedal edema, palpitations, syncope or chest pain.   Current Outpatient Medications  Medication Sig Dispense Refill  . amLODipine (NORVASC) 10 MG tablet Take 1 tablet (10 mg total) by mouth daily. 90 tablet 3  . aspirin 81 MG tablet Take 81 mg by mouth daily.      . finasteride (PROSCAR) 5 MG tablet Take 5 mg by mouth daily.    . hydrochlorothiazide (HYDRODIURIL) 25 MG tablet TAKE 1 TABLET BY MOUTH EVERY DAY 90 tablet 0  . Multiple Vitamin (MULTIVITAMIN WITH MINERALS) TABS Take 1 tablet by mouth daily.    . nizatidine (AXID) 150 MG capsule TAKE ONE CAPSULE BY MOUTH EVERY DAY 90 capsule 3  . Omega-3 Fatty Acids (FISH OIL PO) Take 2 tablets by mouth daily.     . potassium chloride SA (KLOR-CON M20) 20 MEQ tablet TAKE 1 TABLET BY MOUTH EVERY DAY 90 tablet 1  . PROAIR RESPICLICK 123XX123 (90 Base) MCG/ACT AEPB INHALE 2 PUFFS INTO THE LUNGS 4 (FOUR) TIMES DAILY AS NEEDED. 1 each 0  . rosuvastatin (CRESTOR) 20 MG tablet Take 1 tablet (20 mg total) by mouth daily. 90 tablet 3   No current facility-administered medications for this visit.      Past Medical History:  Diagnosis Date  . Family history of ischemic heart disease   . GERD (gastroesophageal reflux disease)   . Hypertension   . Obesity   . Psoriasis     Past Surgical History:  Procedure Laterality Date  . CYSTECTOMY     left chest  . superficial vein removed Bilateral   . TOE SURGERY     right great toe    Social History   Socioeconomic History  . Marital status: Married    Spouse name: Not  on file  . Number of children: Not on file  . Years of education: Not on file  . Highest education level: Not on file  Occupational History  . Not on file  Social Needs  . Financial resource strain: Not on file  . Food insecurity    Worry: Not on file    Inability: Not on file  . Transportation needs    Medical: Not on file    Non-medical: Not on file  Tobacco Use  . Smoking status: Never Smoker  . Smokeless tobacco: Never Used  Substance and Sexual Activity  . Alcohol use: Yes    Alcohol/week: 3.0 standard drinks    Types: 3 Standard drinks or equivalent per week  . Drug use: No  . Sexual activity: Yes  Lifestyle  . Physical activity    Days per week: Not on file    Minutes per session: Not on file  . Stress: Not on file  Relationships  . Social Herbalist on phone: Not on file    Gets together: Not on file    Attends religious service: Not on file    Active member of club or organization: Not on file    Attends meetings of clubs or organizations: Not on file    Relationship status: Not on file  . Intimate  partner violence    Fear of current or ex partner: Not on file    Emotionally abused: Not on file    Physically abused: Not on file    Forced sexual activity: Not on file  Other Topics Concern  . Not on file  Social History Narrative  . Not on file    Family History  Problem Relation Age of Onset  . Heart disease Father   . Hypertension Father   . Heart failure Father   . Heart attack Father 27       both age 85 & 105  . Colon cancer Neg Hx     ROS: no fevers or chills, productive cough, hemoptysis, dysphasia, odynophagia, melena, hematochezia, dysuria, hematuria, rash, seizure activity, orthopnea, PND, pedal edema, claudication. Remaining systems are negative.  Physical Exam: Well-developed well-nourished in no acute distress.  Skin is warm and dry.  HEENT is normal.  Neck is supple.  Chest is clear to auscultation with normal expansion.   Cardiovascular exam is regular rate and rhythm.  Abdominal exam nontender or distended. No masses palpated. Extremities show no edema. neuro grossly intact  ECG-normal sinus rhythm at a rate of 65, first-degree AV block.  Personally reviewed  A/P  1 hypertension-blood pressure is controlled.  Continue present medications and follow.  Laboratories from December 4 personally reviewed.  Sodium 142, potassium 4.7, BUN 12 and creatinine 0.98.  2 hyperlipidemia-laboratories from December 4 showed total cholesterol 111 with LDL 41.  Liver functions normal.  Kirk Ruths, MD

## 2019-04-20 ENCOUNTER — Encounter: Payer: Self-pay | Admitting: Cardiology

## 2019-04-25 ENCOUNTER — Other Ambulatory Visit: Payer: Self-pay

## 2019-04-25 ENCOUNTER — Encounter: Payer: Self-pay | Admitting: Cardiology

## 2019-04-25 ENCOUNTER — Ambulatory Visit (INDEPENDENT_AMBULATORY_CARE_PROVIDER_SITE_OTHER): Payer: 59 | Admitting: Cardiology

## 2019-04-25 VITALS — BP 126/80 | HR 60 | Temp 98.2°F | Ht 70.0 in | Wt 209.6 lb

## 2019-04-25 DIAGNOSIS — E7800 Pure hypercholesterolemia, unspecified: Secondary | ICD-10-CM

## 2019-04-25 DIAGNOSIS — I10 Essential (primary) hypertension: Secondary | ICD-10-CM

## 2019-04-25 NOTE — Patient Instructions (Signed)
Medication Instructions:  NO CHANGES If you need a refill on your cardiac medications before your next appointment, please call your pharmacy*  Lab Work: If you have labs (blood work) drawn today and your tests are completely normal, you will receive your results only by: Marland Kitchen MyChart Message (if you have MyChart) OR . A paper copy in the mail If you have any lab test that is abnormal or we need to change your treatment, we will call you to review the results.   Follow-Up: At [facility], you and your health needs are our priority.  As part of our continuing mission to provide you with exceptional heart care, we have created designated Provider Care Teams.  These Care Teams include your primary Cardiologist (physician) and Advanced Practice Providers (APPs -  Physician Assistants and Nurse Practitioners) who all work together to provide you with the care you need, when you need it.   Your next appointment:   12 month(s)  The format for your next appointment:   Either In Person or Virtual  Provider:   You may see DR. CRENSHAW  or one of the following Advanced Practice Providers on your designated Care Team:    Kerin Ransom, PA-C  [place], [place]  Coletta Memos, [place]

## 2019-05-14 ENCOUNTER — Ambulatory Visit: Payer: 59 | Attending: Internal Medicine

## 2019-05-14 DIAGNOSIS — Z20822 Contact with and (suspected) exposure to covid-19: Secondary | ICD-10-CM

## 2019-05-16 LAB — NOVEL CORONAVIRUS, NAA: SARS-CoV-2, NAA: NOT DETECTED

## 2019-06-01 ENCOUNTER — Other Ambulatory Visit: Payer: Self-pay | Admitting: Family Medicine

## 2019-06-01 DIAGNOSIS — I10 Essential (primary) hypertension: Secondary | ICD-10-CM

## 2019-06-01 NOTE — Telephone Encounter (Signed)
Had an appt in february

## 2019-06-03 ENCOUNTER — Other Ambulatory Visit: Payer: Self-pay | Admitting: Family Medicine

## 2019-06-03 DIAGNOSIS — I10 Essential (primary) hypertension: Secondary | ICD-10-CM

## 2019-06-18 ENCOUNTER — Other Ambulatory Visit: Payer: Self-pay

## 2019-06-18 ENCOUNTER — Ambulatory Visit: Payer: BC Managed Care – PPO | Admitting: Family Medicine

## 2019-06-18 VITALS — BP 120/78 | HR 71 | Temp 97.3°F | Ht 70.25 in | Wt 202.4 lb

## 2019-06-18 DIAGNOSIS — J301 Allergic rhinitis due to pollen: Secondary | ICD-10-CM | POA: Diagnosis not present

## 2019-06-18 DIAGNOSIS — E785 Hyperlipidemia, unspecified: Secondary | ICD-10-CM

## 2019-06-18 DIAGNOSIS — E663 Overweight: Secondary | ICD-10-CM

## 2019-06-18 DIAGNOSIS — Z8249 Family history of ischemic heart disease and other diseases of the circulatory system: Secondary | ICD-10-CM

## 2019-06-18 DIAGNOSIS — Z0000 Encounter for general adult medical examination without abnormal findings: Secondary | ICD-10-CM

## 2019-06-18 DIAGNOSIS — J4520 Mild intermittent asthma, uncomplicated: Secondary | ICD-10-CM

## 2019-06-18 DIAGNOSIS — R739 Hyperglycemia, unspecified: Secondary | ICD-10-CM

## 2019-06-18 DIAGNOSIS — N400 Enlarged prostate without lower urinary tract symptoms: Secondary | ICD-10-CM

## 2019-06-18 DIAGNOSIS — I10 Essential (primary) hypertension: Secondary | ICD-10-CM

## 2019-06-18 DIAGNOSIS — R9431 Abnormal electrocardiogram [ECG] [EKG]: Secondary | ICD-10-CM

## 2019-06-18 DIAGNOSIS — L409 Psoriasis, unspecified: Secondary | ICD-10-CM

## 2019-06-18 DIAGNOSIS — K219 Gastro-esophageal reflux disease without esophagitis: Secondary | ICD-10-CM

## 2019-06-18 LAB — POCT URINALYSIS DIP (PROADVANTAGE DEVICE)
Bilirubin, UA: NEGATIVE
Blood, UA: NEGATIVE
Glucose, UA: NEGATIVE mg/dL
Ketones, POC UA: NEGATIVE mg/dL
Leukocytes, UA: NEGATIVE
Nitrite, UA: NEGATIVE
Protein Ur, POC: NEGATIVE mg/dL
Specific Gravity, Urine: 1.01
Urobilinogen, Ur: 0.2
pH, UA: 8 (ref 5.0–8.0)

## 2019-06-18 LAB — POCT GLYCOSYLATED HEMOGLOBIN (HGB A1C): Hemoglobin A1C: 5.4 % (ref 4.0–5.6)

## 2019-06-18 MED ORDER — AMLODIPINE BESYLATE 10 MG PO TABS: 10.0000 mg | ORAL_TABLET | Freq: Every day | ORAL | 3 refills | Status: DC

## 2019-06-18 MED ORDER — FINASTERIDE 5 MG PO TABS: 5.0000 mg | ORAL_TABLET | Freq: Every day | ORAL | 3 refills | Status: DC

## 2019-06-18 MED ORDER — ROSUVASTATIN CALCIUM 20 MG PO TABS: 20.0000 mg | ORAL_TABLET | Freq: Every day | ORAL | 3 refills | Status: DC

## 2019-06-18 NOTE — Progress Notes (Signed)
   Subjective:    Patient ID: Randy [name], male    DOB: 11/12/1965, 54 y.o.   MRN: [id number]  HPI He is here for complete examination.  He started a weight loss program by calorie counting back in March and has proceeded to lose close to 40 pounds.  His BMI is now under 29.  He is also involved in a program through work to help with weight, cholesterol etc. which allows him to get cheaper rates.  He does have a family history of heart disease and does see cardiology regularly.  He does have springtime allergies and occasionally will have asthma associated with that.  He rarely uses albuterol.  He no longer has difficulty with reflux disease.  He has had some hematospermia and presently is on finasteride which apparently has helped with this.  He has seen urology in the past for this.  He continues on rosuvastatin for his lipids and is having no difficulty with that.  Presently is taking amlodipine/HCTZ and potassium supplement for his elevated blood pressure.  Work and home life are going well.  Family and social history as well as health maintenance and immunizations was reviewed   Review of Systems  All other systems reviewed and are negative.      Objective:   Physical Exam Alert and in no distress. Tympanic membranes and canals are normal. Pharyngeal area is normal. Neck is supple without adenopathy or thyromegaly. Cardiac exam shows a regular sinus rhythm without murmurs or gallops. Lungs are clear to auscultation.  Abdominal exam so no masses or tenderness.  Blood work from December was reviewed.  He did have a slightly elevated blood sugar Hemoglobin A1c is 5.4.       Assessment & Plan:  Routine medical exam - Plan: POCT Urinalysis DIP (Proadvantage Device)  Blood glucose elevated - Plan: POCT glycosylated hemoglobin (Hb A1C)  Overweight (BMI 25.0-29.9)  Seasonal allergic rhinitis due to pollen  Psoriasis  Nonspecific abnormal electrocardiogram (ECG) (EKG)  Hyperlipidemia  LDL goal <100 - Plan: rosuvastatin (CRESTOR) 20 MG tablet  Gastroesophageal reflux disease without esophagitis  Essential hypertension - Plan: amLODipine (NORVASC) 10 MG tablet  Family history of heart disease in male family member before age 41  Routine general medical examination at a health care facility - Plan: amLODipine (NORVASC) 10 MG tablet  Mild intermittent asthma without complication  Benign prostatic hyperplasia without lower urinary tract symptoms - Plan: finasteride (PROSCAR) 5 MG tablet  I congratulated him on his weight loss and the fact that he is exercising regularly.  Since his blood pressure is down I think it is reasonable to see how he will do off HCTZ and Klor-Con.  His blood pressure cuff at home is a fairly accurate and he will keep [place] informed.  Recommend he keep it below 130/80.  He will continue on his other medications.  Discussed his family history of heart disease.  Periodic follow-up with cardiology is reasonable.

## 2019-06-24 ENCOUNTER — Other Ambulatory Visit: Payer: Self-pay | Admitting: Family Medicine

## 2019-06-24 DIAGNOSIS — I10 Essential (primary) hypertension: Secondary | ICD-10-CM

## 2019-08-09 ENCOUNTER — Ambulatory Visit: Payer: Self-pay | Attending: Internal Medicine

## 2019-08-09 DIAGNOSIS — Z23 Encounter for immunization: Secondary | ICD-10-CM

## 2019-08-09 NOTE — Progress Notes (Signed)
   Covid-19 Vaccination Clinic  Name:  SHYAN DIECKHOFF    MRN: [id number] DOB: 03/22/1966  08/09/2019  Mr. Force was observed post Covid-19 immunization for 15 minutes without incident. He was provided with Vaccine Information Sheet and instruction to access the V-Safe system.   Mr. Mackowiak was instructed to call 911 with any severe reactions post vaccine: Marland Kitchen Difficulty breathing  . Swelling of face and throat  . A fast heartbeat  . A bad rash all over body  . Dizziness and weakness   Immunizations Administered    Name Date Dose VIS Date Route   Pfizer COVID-19 Vaccine 08/09/2019  9:09 AM 0.3 mL 04/27/2019 Intramuscular   Manufacturer: [place]   Lot: [id number]   [place]: [id number]

## 2019-08-28 ENCOUNTER — Other Ambulatory Visit: Payer: Self-pay | Admitting: Family Medicine

## 2019-08-28 DIAGNOSIS — I10 Essential (primary) hypertension: Secondary | ICD-10-CM

## 2019-08-28 NOTE — Telephone Encounter (Signed)
LVM to confirm that potassium has been d/c. [place]

## 2019-09-03 ENCOUNTER — Ambulatory Visit: Payer: Self-pay | Attending: Internal Medicine

## 2019-09-03 DIAGNOSIS — Z23 Encounter for immunization: Secondary | ICD-10-CM

## 2019-09-03 NOTE — Progress Notes (Signed)
   Covid-19 Vaccination Clinic  Name:  DESTINY SCHMUCK    MRN: [id number] DOB: 1965-05-19  09/03/2019  Mr. Rinderknecht was observed post Covid-19 immunization for 15 minutes without incident. He was provided with Vaccine Information Sheet and instruction to access the V-Safe system.   Mr. Shanafelt was instructed to call 911 with any severe reactions post vaccine: Marland Kitchen Difficulty breathing  . Swelling of face and throat  . A fast heartbeat  . A bad rash all over body  . Dizziness and weakness   Immunizations Administered    Name Date Dose VIS Date Route   Pfizer COVID-19 Vaccine 09/03/2019  8:54 AM 0.3 mL 07/11/2018 Intramuscular   Manufacturer: [place]   Lot: [id number]   [place]: [id number]

## 2019-09-09 ENCOUNTER — Other Ambulatory Visit: Payer: Self-pay | Admitting: Family Medicine

## 2019-09-09 DIAGNOSIS — I10 Essential (primary) hypertension: Secondary | ICD-10-CM

## 2019-09-18 DIAGNOSIS — N400 Enlarged prostate without lower urinary tract symptoms: Secondary | ICD-10-CM | POA: Diagnosis not present

## 2019-09-18 DIAGNOSIS — R361 Hematospermia: Secondary | ICD-10-CM | POA: Diagnosis not present

## 2019-11-29 ENCOUNTER — Telehealth: Payer: Self-pay | Admitting: Family Medicine

## 2019-11-29 NOTE — Telephone Encounter (Signed)
Pt and wife with scratchy throat. Both vaccinated. Discussed delta variant possibility (with very mild symptoms or asymptomatic)--encouraged him to get tested.  Questions answered to the best of my ability.

## 2019-11-30 DIAGNOSIS — Z20822 Contact with and (suspected) exposure to covid-19: Secondary | ICD-10-CM | POA: Diagnosis not present

## 2020-03-19 DIAGNOSIS — N401 Enlarged prostate with lower urinary tract symptoms: Secondary | ICD-10-CM | POA: Diagnosis not present

## 2020-03-19 DIAGNOSIS — R361 Hematospermia: Secondary | ICD-10-CM | POA: Diagnosis not present

## 2020-03-19 DIAGNOSIS — R351 Nocturia: Secondary | ICD-10-CM | POA: Diagnosis not present

## 2020-04-23 ENCOUNTER — Telehealth: Payer: Self-pay

## 2020-04-23 NOTE — Telephone Encounter (Signed)
Pt was called to advise of the need to call back to reschedule his CPE 06/18/20.

## 2020-04-25 ENCOUNTER — Other Ambulatory Visit: Payer: Self-pay

## 2020-04-25 ENCOUNTER — Ambulatory Visit (INDEPENDENT_AMBULATORY_CARE_PROVIDER_SITE_OTHER): Payer: BC Managed Care – PPO

## 2020-04-25 DIAGNOSIS — Z23 Encounter for immunization: Secondary | ICD-10-CM

## 2020-05-19 ENCOUNTER — Other Ambulatory Visit (INDEPENDENT_AMBULATORY_CARE_PROVIDER_SITE_OTHER): Payer: BC Managed Care – PPO

## 2020-05-19 ENCOUNTER — Other Ambulatory Visit: Payer: Self-pay

## 2020-05-19 ENCOUNTER — Encounter: Payer: Self-pay | Admitting: Family Medicine

## 2020-05-19 ENCOUNTER — Telehealth: Payer: BC Managed Care – PPO | Admitting: Family Medicine

## 2020-05-19 VITALS — Ht 70.0 in | Wt 202.0 lb

## 2020-05-19 DIAGNOSIS — J029 Acute pharyngitis, unspecified: Secondary | ICD-10-CM | POA: Diagnosis not present

## 2020-05-19 DIAGNOSIS — J3489 Other specified disorders of nose and nasal sinuses: Secondary | ICD-10-CM

## 2020-05-19 LAB — POC COVID19 BINAXNOW: SARS Coronavirus 2 Ag: NEGATIVE

## 2020-05-19 NOTE — Progress Notes (Signed)
   Subjective:    Patient ID: Randy [name], male    DOB: 1966-02-21, 55 y.o.   MRN: [id number]  HPI Telemedicine was attempted but could not get computer set up properly.  The encounter was then carried out via phone only.  2 separate identifiers were used.  He is at home, I am in my office. Saturday he developed sneezing, postnasal drainage, rhinorrhea, scratchy throat with some nasal congestion.  This has continued.  He has had no fever, chills, cough, smell or taste change or earache.  He did have a booster shot given approximately 2 weeks ago.  He has been using OTC meds to help with his symptoms.  Review of Systems     Objective:   Physical Exam Alert and in no distress with normal voice pattern.       Assessment & Plan:  Sore throat - Plan: POC COVID-19, Novel Coronavirus, NAA ([facility])  Rhinorrhea - Plan: POC COVID-19, Novel Coronavirus, NAA ([facility]) Discussed conservative care with Tylenol, decongestant, antihistamine. Discussed the new protocol with Covid testing. 20 minutes today spent in review of medical record, history exam documentation consultation

## 2020-05-19 NOTE — Progress Notes (Deleted)
HPI: FU hypertension. Venous Dopplers in April of 2014 showed venous insufficiency. Lower extremity arterial dopplers in May 2014 normal. Seen by Dr Kirke Corin in Sept 2014 for venous insuff and initial conservative measures recommended. Venous ablation could be considered if remains symptomatic despite these measures. Stress echocardiogram February 2015 normal. Since last seen,  Current Outpatient Medications  Medication Sig Dispense Refill  . amLODipine (NORVASC) 10 MG tablet Take 1 tablet (10 mg total) by mouth daily. 90 tablet 3  . aspirin 81 MG tablet Take 81 mg by mouth daily.      . finasteride (PROSCAR) 5 MG tablet Take 1 tablet (5 mg total) by mouth daily. 90 tablet 3  . Multiple Vitamin (MULTIVITAMIN WITH MINERALS) TABS Take 1 tablet by mouth daily.    . nizatidine (AXID) 150 MG capsule TAKE ONE CAPSULE BY MOUTH EVERY DAY (Patient not taking: Reported on 06/18/2019) 90 capsule 3  . Omega-3 Fatty Acids (FISH OIL PO) Take 2 tablets by mouth daily.     Marland Kitchen PROAIR RESPICLICK 108 (90 Base) MCG/ACT AEPB INHALE 2 PUFFS INTO THE LUNGS 4 (FOUR) TIMES DAILY AS NEEDED. 1 each 0  . rosuvastatin (CRESTOR) 20 MG tablet Take 1 tablet (20 mg total) by mouth daily. 90 tablet 3   No current facility-administered medications for this visit.     Past Medical History:  Diagnosis Date  . Family history of ischemic heart disease   . GERD (gastroesophageal reflux disease)   . Hypertension   . Obesity   . Psoriasis     Past Surgical History:  Procedure Laterality Date  . CYSTECTOMY     left chest  . superficial vein removed Bilateral   . TOE SURGERY     right great toe    Social History   Socioeconomic History  . Marital status: Married    Spouse name: Not on file  . Number of children: Not on file  . Years of education: Not on file  . Highest education level: Not on file  Occupational History  . Not on file  Tobacco Use  . Smoking status: Never Smoker  . Smokeless tobacco: Never  Used  Vaping Use  . Vaping Use: Never used  Substance and Sexual Activity  . Alcohol use: Yes    Alcohol/week: 3.0 standard drinks    Types: 3 Standard drinks or equivalent per week  . Drug use: No  . Sexual activity: Yes  Other Topics Concern  . Not on file  Social History Narrative  . Not on file   Social Determinants of Health   Financial Resource Strain: Not on file  Food Insecurity: Not on file  Transportation Needs: Not on file  Physical Activity: Not on file  Stress: Not on file  Social Connections: Not on file  Intimate Partner Violence: Not on file    Family History  Problem Relation Age of Onset  . Heart disease Father   . Hypertension Father   . Heart failure Father   . Heart attack Father 38       both age 9 & 25  . Colon cancer Neg Hx     ROS: no fevers or chills, productive cough, hemoptysis, dysphasia, odynophagia, melena, hematochezia, dysuria, hematuria, rash, seizure activity, orthopnea, PND, pedal edema, claudication. Remaining systems are negative.  Physical Exam: Well-developed well-nourished in no acute distress.  Skin is warm and dry.  HEENT is normal.  Neck is supple.  Chest is clear to auscultation with  normal expansion.  Cardiovascular exam is regular rate and rhythm.  Abdominal exam nontender or distended. No masses palpated. Extremities show no edema. neuro grossly intact  ECG- personally reviewed  A/P  1 hypertension-patient's blood pressure is controlled.  Continue present medical regimen.  2 hyperlipidemia-continue statin.  Lipids and liver monitored by primary care.  3 family history of coronary artery disease-we will arrange a calcium score for risk stratification.  Kirk Ruths, MD

## 2020-05-21 ENCOUNTER — Encounter: Payer: Self-pay | Admitting: Family Medicine

## 2020-05-21 ENCOUNTER — Other Ambulatory Visit: Payer: Self-pay

## 2020-05-21 ENCOUNTER — Telehealth: Payer: BC Managed Care – PPO | Admitting: Family Medicine

## 2020-05-21 VITALS — Temp 97.5°F | Wt 202.0 lb

## 2020-05-21 DIAGNOSIS — L309 Dermatitis, unspecified: Secondary | ICD-10-CM

## 2020-05-21 LAB — NOVEL CORONAVIRUS, NAA: SARS-CoV-2, NAA: NOT DETECTED

## 2020-05-21 LAB — SARS-COV-2, NAA 2 DAY TAT

## 2020-05-21 NOTE — Progress Notes (Signed)
   Subjective:    Patient ID: Randy [name], male    DOB: 05-Jan-1966, 55 y.o.   MRN: [id number]  HPI Teleconferencing was attempted but unable to be accomplished.  He had a question about a rash.  A picture was sent through my chart for me to evaluate.  He complains of the rash but it is only slightly itching and very little pain associated with it.  Review of Systems     Objective:   Physical Exam Exam of the photo does show 3 erythematous areas near each other on the torso.  He does not describe any pain associated with this.        Assessment & Plan:  Dermatitis I explained that I did not think this was infectious, herpes or tinea.  Recommend supportive care of this. 15 minutes spent discussing this with him.

## 2020-05-22 ENCOUNTER — Telehealth: Payer: Self-pay | Admitting: Family Medicine

## 2020-05-22 DIAGNOSIS — B029 Zoster without complications: Secondary | ICD-10-CM

## 2020-05-22 MED ORDER — VALACYCLOVIR HCL 1 G PO TABS: 1000.0000 mg | ORAL_TABLET | Freq: Three times a day (TID) | ORAL | 0 refills | Status: DC

## 2020-05-22 NOTE — Telephone Encounter (Signed)
Please call re rash  Rash is starting to turn red and blister a little It is sensitive to touch

## 2020-05-22 NOTE — Telephone Encounter (Signed)
He called back today stating the rash has gotten worse and is now having some tingling and burning sensation.  I will treat him like he has shingles and call if he has further difficulties.  Recommend Advil or Aleve for pain relief.  Recommend he Grubel shingles to get more information.

## 2020-05-29 ENCOUNTER — Encounter: Payer: Self-pay | Admitting: *Deleted

## 2020-06-02 ENCOUNTER — Ambulatory Visit: Payer: Self-pay | Admitting: Cardiology

## 2020-06-16 ENCOUNTER — Other Ambulatory Visit: Payer: Self-pay | Admitting: Family Medicine

## 2020-06-16 DIAGNOSIS — N400 Enlarged prostate without lower urinary tract symptoms: Secondary | ICD-10-CM

## 2020-06-17 ENCOUNTER — Telehealth: Payer: Self-pay | Admitting: Family Medicine

## 2020-06-17 NOTE — Telephone Encounter (Signed)
Schedule him to come in tomorrow for Covid test.

## 2020-06-17 NOTE — Telephone Encounter (Signed)
Pt said he woke up this morning with a scratchy throat and chest congestion. He is scheduled for cpe with you on Thursday. I offered to change pts appt to a virtual sick visit but he declined because he already rescheduled his CPE once. He wanted to see what you suggest.

## 2020-06-18 ENCOUNTER — Other Ambulatory Visit (INDEPENDENT_AMBULATORY_CARE_PROVIDER_SITE_OTHER): Payer: BC Managed Care – PPO

## 2020-06-18 ENCOUNTER — Encounter: Payer: BC Managed Care – PPO | Admitting: Family Medicine

## 2020-06-18 ENCOUNTER — Telehealth: Payer: Self-pay

## 2020-06-18 DIAGNOSIS — R059 Cough, unspecified: Secondary | ICD-10-CM

## 2020-06-18 LAB — POC COVID19 BINAXNOW: SARS Coronavirus 2 Ag: NEGATIVE

## 2020-06-18 NOTE — Telephone Encounter (Signed)
Pt will com in tomorrow. Pt was giving a N95 masked for his appointment. The symptoms he has caused pt to have a rapid covid test which came back negative. PCR will still be done as well. Pt agreed to where the N95 for his apt tomorrow . [place]

## 2020-06-19 ENCOUNTER — Other Ambulatory Visit: Payer: Self-pay

## 2020-06-19 ENCOUNTER — Encounter: Payer: Self-pay | Admitting: Family Medicine

## 2020-06-19 ENCOUNTER — Ambulatory Visit (INDEPENDENT_AMBULATORY_CARE_PROVIDER_SITE_OTHER): Payer: BC Managed Care – PPO | Admitting: Family Medicine

## 2020-06-19 VITALS — BP 134/88 | HR 55 | Temp 97.5°F | Ht 70.0 in | Wt 196.0 lb

## 2020-06-19 DIAGNOSIS — Z8249 Family history of ischemic heart disease and other diseases of the circulatory system: Secondary | ICD-10-CM

## 2020-06-19 DIAGNOSIS — Z125 Encounter for screening for malignant neoplasm of prostate: Secondary | ICD-10-CM | POA: Diagnosis not present

## 2020-06-19 DIAGNOSIS — I10 Essential (primary) hypertension: Secondary | ICD-10-CM

## 2020-06-19 DIAGNOSIS — Z0000 Encounter for general adult medical examination without abnormal findings: Secondary | ICD-10-CM | POA: Diagnosis not present

## 2020-06-19 DIAGNOSIS — J301 Allergic rhinitis due to pollen: Secondary | ICD-10-CM | POA: Diagnosis not present

## 2020-06-19 DIAGNOSIS — Z1159 Encounter for screening for other viral diseases: Secondary | ICD-10-CM

## 2020-06-19 DIAGNOSIS — E785 Hyperlipidemia, unspecified: Secondary | ICD-10-CM

## 2020-06-19 LAB — NOVEL CORONAVIRUS, NAA: SARS-CoV-2, NAA: NOT DETECTED

## 2020-06-19 LAB — SARS-COV-2, NAA 2 DAY TAT

## 2020-06-19 MED ORDER — AMLODIPINE BESYLATE 10 MG PO TABS: 10.0000 mg | ORAL_TABLET | Freq: Every day | ORAL | 3 refills | Status: DC

## 2020-06-19 MED ORDER — ROSUVASTATIN CALCIUM 20 MG PO TABS: 20.0000 mg | ORAL_TABLET | Freq: Every day | ORAL | 3 refills | Status: DC

## 2020-06-19 NOTE — Progress Notes (Signed)
   Subjective:    Patient ID: Randy [name], male    DOB: [birth year], 55 y.o.   MRN: [id number]  HPI He is here for complete examination.  He did have a viral illness about a month ago and did get tested for Covid which was negative.  Last Sunday he developed some postnasal drainage, chest tightness, dry cough.  He states that he is feeling slightly better today.  He had a Covid test yesterday which was negative and the PCR is pending.  He has had his vaccines plus the booster.  He continues on his blood pressure medication.  He has underlying allergies but is having no difficulty with them.  He is also taking Crestor without difficulty.  He does have a history of hematospermia and has been seen by urology.  He is presently taking finasteride to help with that.  He has lost a significant amount of weight mainly through calorie counting.  He is also walking on a regular basis.  His marriage is going well.  Family and social history as well as health maintenance and immunizations was reviewed   Review of Systems  All other systems reviewed and are negative.      Objective:   Physical Exam Alert and in no distress. Tympanic membranes and canals are normal. Pharyngeal area is normal. Neck is supple without adenopathy or thyromegaly. Cardiac exam shows a regular sinus rhythm without murmurs or gallops. Lungs are clear to auscultation.  Abdominal exam shows no masses or tenderness with normal bowel sounds        Assessment & Plan:  Routine medical exam - Plan: CBC with Differential/Platelet, Comprehensive metabolic panel, Lipid panel  Seasonal allergic rhinitis due to pollen  Essential hypertension - Plan: amLODipine (NORVASC) 10 MG tablet  Family history of heart disease in male family member before age 44  Need for hepatitis C screening test - Plan: Hepatitis C antibody  Screening for prostate cancer - Plan: PSA  Hyperlipidemia LDL goal <100 - Plan: rosuvastatin (CRESTOR) 20 MG  tablet  Routine general medical examination at a health care facility - Plan: amLODipine (NORVASC) 10 MG tablet  No intervention needed for the allergies.  I complemented him on his weight loss and exercise pattern.  He is to continue on his present medications.  Also recommend a multivitamin. Discussed the pending PCR test and follow-up concerning possible bronchitis.  He stated that as long as he getting better, no further intervention needed.

## 2020-06-20 ENCOUNTER — Encounter: Payer: Self-pay | Admitting: Family Medicine

## 2020-06-20 LAB — CBC WITH DIFFERENTIAL/PLATELET
Basophils Absolute: 0.1 10*3/uL (ref 0.0–0.2)
Basos: 1 %
EOS (ABSOLUTE): 0.1 10*3/uL (ref 0.0–0.4)
Eos: 2 %
Hematocrit: 45.5 % (ref 37.5–51.0)
Hemoglobin: 15.8 g/dL (ref 13.0–17.7)
Immature Grans (Abs): 0 10*3/uL (ref 0.0–0.1)
Immature Granulocytes: 0 %
Lymphocytes Absolute: 2.3 10*3/uL (ref 0.7–3.1)
Lymphs: 24 %
MCH: 31.3 pg (ref 26.6–33.0)
MCHC: 34.7 g/dL (ref 31.5–35.7)
MCV: 90 fL (ref 79–97)
Monocytes Absolute: 0.6 10*3/uL (ref 0.1–0.9)
Monocytes: 6 %
Neutrophils Absolute: 6.3 10*3/uL (ref 1.4–7.0)
Neutrophils: 67 %
Platelets: 252 10*3/uL (ref 150–450)
RBC: 5.05 x10E6/uL (ref 4.14–5.80)
RDW: 13.5 % (ref 11.6–15.4)
WBC: 9.3 10*3/uL (ref 3.4–10.8)

## 2020-06-20 LAB — COMPREHENSIVE METABOLIC PANEL
ALT: 27 IU/L (ref 0–44)
AST: 21 IU/L (ref 0–40)
Albumin/Globulin Ratio: 2.1 (ref 1.2–2.2)
Albumin: 5 g/dL — ABNORMAL HIGH (ref 3.8–4.9)
Alkaline Phosphatase: 58 IU/L (ref 44–121)
BUN/Creatinine Ratio: 9 (ref 9–20)
BUN: 9 mg/dL (ref 6–24)
Bilirubin Total: 0.7 mg/dL (ref 0.0–1.2)
CO2: 25 mmol/L (ref 20–29)
Calcium: 9.9 mg/dL (ref 8.7–10.2)
Chloride: 101 mmol/L (ref 96–106)
Creatinine, Ser: 1.02 mg/dL (ref 0.76–1.27)
GFR calc Af Amer: 95 mL/min/{1.73_m2} (ref >59)
GFR calc non Af Amer: 82 mL/min/{1.73_m2} (ref >59)
Globulin, Total: 2.4 g/dL (ref 1.5–4.5)
Glucose: 102 mg/dL — ABNORMAL HIGH (ref 65–99)
Potassium: 4.5 mmol/L (ref 3.5–5.2)
Sodium: 141 mmol/L (ref 134–144)
Total Protein: 7.4 g/dL (ref 6.0–8.5)

## 2020-06-20 LAB — PSA: Prostate Specific Ag, Serum: 0.3 ng/mL (ref 0.0–4.0)

## 2020-06-20 LAB — LIPID PANEL
Chol/HDL Ratio: 2.1 ratio (ref 0.0–5.0)
Cholesterol, Total: 137 mg/dL (ref 100–199)
HDL: 66 mg/dL (ref >39)
LDL Chol Calc (NIH): 56 mg/dL (ref 0–99)
Triglycerides: 77 mg/dL (ref 0–149)
VLDL Cholesterol Cal: 15 mg/dL (ref 5–40)

## 2020-06-20 LAB — HEPATITIS C ANTIBODY: Hep C Virus Ab: <0.1 s/co ratio (ref 0.0–0.9)

## 2020-08-12 NOTE — Progress Notes (Deleted)
HPI: FU hypertension. Venous Dopplers in April of 2014 showed venous insufficiency. Lower extremity arterial dopplers in May 2014 normal. Seen by Dr Fletcher Anon in Sept 2014 for venous insuff and initial conservative measures recommended. Venous ablation could be considered if remains symptomatic despite these measures. Stress echocardiogram February 2015 normal. Since last seen,  Current Outpatient Medications  Medication Sig Dispense Refill  . amLODipine (NORVASC) 10 MG tablet Take 1 tablet (10 mg total) by mouth daily. 90 tablet 3  . aspirin 81 MG tablet Take 81 mg by mouth daily.    . finasteride (PROSCAR) 5 MG tablet TAKE 1 TABLET BY MOUTH EVERY DAY 90 tablet 3  . Multiple Vitamin (MULTIVITAMIN WITH MINERALS) TABS Take 1 tablet by mouth daily.    . Omega-3 Fatty Acids (FISH OIL PO) Take 2 tablets by mouth daily.     Marland Kitchen PROAIR RESPICLICK 938 (90 Base) MCG/ACT AEPB INHALE 2 PUFFS INTO THE LUNGS 4 (FOUR) TIMES DAILY AS NEEDED. 1 each 0  . rosuvastatin (CRESTOR) 20 MG tablet Take 1 tablet (20 mg total) by mouth daily. 90 tablet 3  . valACYclovir (VALTREX) 1000 MG tablet Take 1 tablet (1,000 mg total) by mouth 3 (three) times daily. (Patient not taking: Reported on 06/19/2020) 21 tablet 0   No current facility-administered medications for this visit.     Past Medical History:  Diagnosis Date  . Family history of ischemic heart disease   . GERD (gastroesophageal reflux disease)   . Hypertension   . Obesity   . Psoriasis     Past Surgical History:  Procedure Laterality Date  . CYSTECTOMY     left chest  . superficial vein removed Bilateral   . TOE SURGERY     right great toe    Social History   Socioeconomic History  . Marital status: Married    Spouse name: Not on file  . Number of children: Not on file  . Years of education: Not on file  . Highest education level: Not on file  Occupational History  . Not on file  Tobacco Use  . Smoking status: Never Smoker  .  Smokeless tobacco: Never Used  Vaping Use  . Vaping Use: Never used  Substance and Sexual Activity  . Alcohol use: Yes    Alcohol/week: 3.0 standard drinks    Types: 3 Standard drinks or equivalent per week  . Drug use: No  . Sexual activity: Yes  Other Topics Concern  . Not on file  Social History Narrative  . Not on file   Social Determinants of Health   Financial Resource Strain: Not on file  Food Insecurity: Not on file  Transportation Needs: Not on file  Physical Activity: Not on file  Stress: Not on file  Social Connections: Not on file  Intimate Partner Violence: Not on file    Family History  Problem Relation Age of Onset  . Heart disease Father   . Hypertension Father   . Heart failure Father   . Heart attack Father 63       both age 44 & 14  . Colon cancer Neg Hx     ROS: no fevers or chills, productive cough, hemoptysis, dysphasia, odynophagia, melena, hematochezia, dysuria, hematuria, rash, seizure activity, orthopnea, PND, pedal edema, claudication. Remaining systems are negative.  Physical Exam: Well-developed well-nourished in no acute distress.  Skin is warm and dry.  HEENT is normal.  Neck is supple.  Chest is clear to auscultation  with normal expansion.  Cardiovascular exam is regular rate and rhythm.  Abdominal exam nontender or distended. No masses palpated. Extremities show no edema. neuro grossly intact  ECG- personally reviewed  A/P  1 hypertension-patient's blood pressure is controlled.  Continue present medications and follow.  2 hyperlipidemia-continue statin.  Kirk Ruths, MD

## 2020-08-13 NOTE — Progress Notes (Signed)
HPI: FU hypertension. Venous Dopplers in April of 2014 showed venous insufficiency. Lower extremity arterial dopplers in May 2014 normal. Seen by Dr Fletcher Anon in Sept 2014 for venous insuff and initial conservative measures recommended. Venous ablation could be considered if remains symptomatic despite these measures. Stress echocardiogram February 2015 normal. Since last seen,patient denies dyspnea, chest pain, palpitations or syncope.  Current Outpatient Medications  Medication Sig Dispense Refill  . amLODipine (NORVASC) 10 MG tablet Take 1 tablet (10 mg total) by mouth daily. 90 tablet 3  . aspirin 81 MG tablet Take 81 mg by mouth daily.    . finasteride (PROSCAR) 5 MG tablet TAKE 1 TABLET BY MOUTH EVERY DAY (Patient taking differently: Patient takes 1/2 tablet) 90 tablet 3  . Multiple Vitamin (MULTIVITAMIN WITH MINERALS) TABS Take 1 tablet by mouth daily.    . Omega-3 Fatty Acids (FISH OIL PO) Take 2 tablets by mouth daily.     Marland Kitchen PROAIR RESPICLICK 856 (90 Base) MCG/ACT AEPB INHALE 2 PUFFS INTO THE LUNGS 4 (FOUR) TIMES DAILY AS NEEDED. 1 each 0  . rosuvastatin (CRESTOR) 20 MG tablet Take 1 tablet (20 mg total) by mouth daily. 90 tablet 3   No current facility-administered medications for this visit.     Past Medical History:  Diagnosis Date  . Family history of ischemic heart disease   . GERD (gastroesophageal reflux disease)   . Hypertension   . Obesity   . Psoriasis     Past Surgical History:  Procedure Laterality Date  . CYSTECTOMY     left chest  . superficial vein removed Bilateral   . TOE SURGERY     right great toe    Social History   Socioeconomic History  . Marital status: Married    Spouse name: Not on file  . Number of children: Not on file  . Years of education: Not on file  . Highest education level: Not on file  Occupational History  . Not on file  Tobacco Use  . Smoking status: Never Smoker  . Smokeless tobacco: Never Used  Vaping Use  . Vaping  Use: Never used  Substance and Sexual Activity  . Alcohol use: Yes    Alcohol/week: 3.0 standard drinks    Types: 3 Standard drinks or equivalent per week  . Drug use: No  . Sexual activity: Yes  Other Topics Concern  . Not on file  Social History Narrative  . Not on file   Social Determinants of Health   Financial Resource Strain: Not on file  Food Insecurity: Not on file  Transportation Needs: Not on file  Physical Activity: Not on file  Stress: Not on file  Social Connections: Not on file  Intimate Partner Violence: Not on file    Family History  Problem Relation Age of Onset  . Heart disease Father   . Hypertension Father   . Heart failure Father   . Heart attack Father 47       both age 47 & 37  . Colon cancer Neg Hx     ROS: no fevers or chills, productive cough, hemoptysis, dysphasia, odynophagia, melena, hematochezia, dysuria, hematuria, rash, seizure activity, orthopnea, PND, pedal edema, claudication. Remaining systems are negative.  Physical Exam: Well-developed well-nourished in no acute distress.  Skin is warm and dry.  HEENT is normal.  Neck is supple.  Chest is clear to auscultation with normal expansion.  Cardiovascular exam is regular rate and rhythm.  Abdominal exam  nontender or distended. No masses palpated. Extremities show no edema. neuro grossly intact  ECG-normal sinus rhythm at a rate of 79, first-degree AV block.  Personally reviewed  A/P  1 hypertension-patient's blood pressure is controlled.  Continue present medications and follow.  2 hyperlipidemia-continue statin.  Laboratories from February 2022 showed LDL 7.  3 family history of coronary artery disease-patient's father had a myocardial infarction in his 67s.  We will arrange a calcium score for risk stratification.  Kirk Ruths, MD

## 2020-08-18 ENCOUNTER — Ambulatory Visit: Payer: Self-pay | Admitting: Cardiology

## 2020-08-21 ENCOUNTER — Encounter: Payer: Self-pay | Admitting: Family Medicine

## 2020-08-21 ENCOUNTER — Other Ambulatory Visit: Payer: Self-pay

## 2020-08-21 ENCOUNTER — Ambulatory Visit: Payer: BC Managed Care – PPO | Admitting: Family Medicine

## 2020-08-21 ENCOUNTER — Ambulatory Visit: Payer: BC Managed Care – PPO | Admitting: Cardiology

## 2020-08-21 ENCOUNTER — Encounter: Payer: Self-pay | Admitting: Cardiology

## 2020-08-21 VITALS — BP 140/86 | HR 60 | Temp 97.7°F | Wt 195.8 lb

## 2020-08-21 VITALS — BP 132/68 | HR 79 | Ht 70.0 in | Wt 195.6 lb

## 2020-08-21 DIAGNOSIS — E7800 Pure hypercholesterolemia, unspecified: Secondary | ICD-10-CM

## 2020-08-21 DIAGNOSIS — I10 Essential (primary) hypertension: Secondary | ICD-10-CM

## 2020-08-21 DIAGNOSIS — R1033 Periumbilical pain: Secondary | ICD-10-CM

## 2020-08-21 NOTE — Patient Instructions (Signed)
  Testing/Procedures:  CARDIAC CT CALCIUM SCORING AT [place]   Follow-Up: At [facility], you and your health needs are our priority.  As part of our continuing mission to provide you with exceptional heart care, we have created designated Provider Care Teams.  These Care Teams include your primary Cardiologist (physician) and Advanced Practice Providers (APPs -  Physician Assistants and Nurse Practitioners) who all work together to provide you with the care you need, when you need it.  We recommend signing up for the patient portal called "MyChart".  Sign up information is provided on this After Visit Summary.  MyChart is used to connect with patients for Virtual Visits (Telemedicine).  Patients are able to view lab/test results, encounter notes, upcoming appointments, etc.  Non-urgent messages can be sent to your provider as well.   To learn more about what you can do with MyChart, go to [email or web address].    Your next appointment:   12 month(s)  The format for your next appointment:   In Person  Provider:   Kirk Ruths, MD

## 2020-08-21 NOTE — Progress Notes (Signed)
   Subjective:    Patient ID: Randy [name], male    DOB: Apr 07, 1966, 55 y.o.   MRN: [id number]  HPI He complains of a 1 day history of some periumbilical discomfort and a feeling of a lump in that area.  He was able to work out today and have no difficulty with that.   Review of Systems     Objective:   Physical Exam Slight tenderness palpation in the periumbilical area and questionable 1 cm lesion was palpated superior to the umbilicus.       Assessment & Plan:  Periumbilical pain I explained that this is most likely an abdominal hernia.  I explained that if indeed is that that that does not always have to be surgically repaired, depends on how much trouble he is having with it.  Encouraged him to keep track of the discomfort, when it occurs and what he is doing.  Explained we can always evaluate this again in the future.  He was comfortable with that.

## 2020-08-25 DIAGNOSIS — I83893 Varicose veins of bilateral lower extremities with other complications: Secondary | ICD-10-CM | POA: Diagnosis not present

## 2020-09-26 ENCOUNTER — Other Ambulatory Visit: Payer: Self-pay

## 2020-09-29 ENCOUNTER — Encounter: Payer: Self-pay | Admitting: Family Medicine

## 2020-09-29 DIAGNOSIS — R1033 Periumbilical pain: Secondary | ICD-10-CM

## 2020-10-03 ENCOUNTER — Other Ambulatory Visit: Payer: Self-pay

## 2020-10-03 ENCOUNTER — Encounter: Payer: Self-pay | Admitting: Surgery

## 2020-10-03 ENCOUNTER — Ambulatory Visit (INDEPENDENT_AMBULATORY_CARE_PROVIDER_SITE_OTHER): Payer: BC Managed Care – PPO | Admitting: Surgery

## 2020-10-03 VITALS — BP 146/80 | HR 56 | Temp 98.4°F | Ht 70.0 in | Wt 192.0 lb

## 2020-10-03 DIAGNOSIS — K429 Umbilical hernia without obstruction or gangrene: Secondary | ICD-10-CM

## 2020-10-03 NOTE — Patient Instructions (Addendum)
Call us if you decide you would like to get this repaired in the future. We will arrange for you to come in to discuss this.    If you have surgery you will need to arrange to be off work for 1-2 weeks but will have to have a lifting restriction of no more than 15-20 lbs for 6 weeks following your surgery.   Umbilical Hernia, Adult  A hernia is a bulge of tissue that pushes through an opening between muscles. An umbilical hernia happens in the abdomen, near the belly button (umbilicus). The hernia may contain tissues from the small intestine, large intestine, or fatty tissue covering the intestines (omentum). Umbilical hernias in adults tend to get worse over time, and they require surgical treatment. There are several types of umbilical hernias. You may have:  A hernia located just above or below the umbilicus (indirect hernia). This is the most common type of umbilical hernia in adults.  A hernia that forms through an opening formed by the umbilicus (direct hernia).  A hernia that comes and goes (reducible hernia). A reducible hernia may be visible only when you strain, lift something heavy, or cough. This type of hernia can be pushed back into the abdomen (reduced).  A hernia that traps abdominal tissue inside the hernia (incarcerated hernia). This type of hernia cannot be reduced.  A hernia that cuts off blood flow to the tissues inside the hernia (strangulated hernia). The tissues can start to die if this happens. This type of hernia requires emergency treatment. What are the causes? An umbilical hernia happens when tissue inside the abdomen presses on a weak area of the abdominal muscles. What increases the risk? You may have a greater risk of this condition if you:  Are obese.  Have had several pregnancies.  Have a buildup of fluid inside your abdomen (ascites).  Have had surgery that weakens the abdominal muscles. What are the signs or symptoms? The main symptom of this  condition is a painless bulge at or near the belly button. A reducible hernia may be visible only when you strain, lift something heavy, or cough. Other symptoms may include:  Dull pain.  A feeling of pressure. Symptoms of a strangulated hernia may include:  Pain that gets increasingly worse.  Nausea and vomiting.  Pain when pressing on the hernia.  Skin over the hernia becoming red or purple.  Constipation.  Blood in the stool. How is this diagnosed? This condition may be diagnosed based on:  A physical exam. You may be asked to cough or strain while standing. These actions increase the pressure inside your abdomen and force the hernia through the opening in your muscles. Your health care provider may try to reduce the hernia by pressing on it.  Your symptoms and medical history. How is this treated? Surgery is the only treatment for an umbilical hernia. Surgery for a strangulated hernia is done as soon as possible. If you have a small hernia that is not incarcerated, you may need to lose weight before having surgery. Follow these instructions at home:  Lose weight, if told by your health care provider.  Do not try to push the hernia back in.  Watch your hernia for any changes in color or size. Tell your health care provider if any changes occur.  You may need to avoid activities that increase pressure on your hernia.  Do not lift anything that is heavier than 10 lb (4.5 kg) until your health care provider  says that this is safe.  Take over-the-counter and prescription medicines only as told by your health care provider.  Keep all follow-up visits as told by your health care provider. This is important. Contact a health care provider if:  Your hernia gets larger.  Your hernia becomes painful. Get help right away if:  You develop sudden, severe pain near the area of your hernia.  You have pain as well as nausea or vomiting.  You have pain and the skin over your  hernia changes color.  You develop a fever. This information is not intended to replace advice given to you by your health care provider. Make sure you discuss any questions you have with your health care provider. Document Revised: 06/15/2017 Document Reviewed: 11/01/2016 Elsevier Patient Education  [place].

## 2020-10-05 ENCOUNTER — Encounter: Payer: Self-pay | Admitting: Surgery

## 2020-10-05 NOTE — Progress Notes (Signed)
10/03/2020  Reason for Visit:  Umbilical hernia  Referring Provider:  Jill Alexanders, MD  History of Present Illness: Randy [name] is a 55 y.o. male presenting for evaluation of an umbilical hernia.  The patient reports that he feels that he's had this since about early April.  He's been exercising more and losing weight and noticed a lump.  He does have some discomfort that happens particularly if he's working out, but denies any significant pain.  Reports that the bulging occasional appears and is sensitive to the touch.  Denies any other areas of bulging.  Denies any nausea, vomiting, constipation, or diarrhea.  He saw his PCP in April and initially given the minimal symptoms, decided to do watchful waiting instead.  However, over the past month, reports that it's still present.  His symptoms have not worsened.  He wanted to see surgery as a precaution to check if any imaging was needed and if any urgent procedure was needed.  Past Medical History: Past Medical History:  Diagnosis Date  . Family history of ischemic heart disease   . GERD (gastroesophageal reflux disease)   . Hypertension   . Obesity   . Psoriasis      Past Surgical History: Past Surgical History:  Procedure Laterality Date  . CYSTECTOMY     left chest  . superficial vein removed Bilateral   . TOE SURGERY     right great toe    Home Medications: Prior to Admission medications   Medication Sig Start Date End Date Taking? Authorizing Provider  amLODipine (NORVASC) 10 MG tablet Take 1 tablet (10 mg total) by mouth daily. 06/19/20  Yes Denita Lung, MD  aspirin 81 MG tablet Take 81 mg by mouth daily.   Yes [provider]  finasteride (PROSCAR) 5 MG tablet TAKE 1 TABLET BY MOUTH EVERY DAY Patient taking differently: Patient takes 1/2 tablet 06/16/20  Yes Denita Lung, MD  loratadine (CLARITIN) 10 MG tablet Take 10 mg by mouth daily.   Yes [provider]  Multiple Vitamin (MULTIVITAMIN WITH  MINERALS) TABS Take 1 tablet by mouth daily.   Yes [provider]  Omega-3 Fatty Acids (FISH OIL PO) Take 2 tablets by mouth daily.    Yes [provider]  PROAIR RESPICLICK 782 (90 Base) MCG/ACT AEPB INHALE 2 PUFFS INTO THE LUNGS 4 (FOUR) TIMES DAILY AS NEEDED. 02/13/18  Yes Denita Lung, MD  rosuvastatin (CRESTOR) 20 MG tablet Take 1 tablet (20 mg total) by mouth daily. 06/19/20  Yes Denita Lung, MD    Allergies: No Known Allergies  Social History:  reports that he has never smoked. He has never used smokeless tobacco. He reports current alcohol use of about 3.0 standard drinks of alcohol per week. He reports that he does not use drugs.   Family History: Family History  Problem Relation Age of Onset  . Heart disease Father   . Hypertension Father   . Heart failure Father   . Heart attack Father 27       both age 68 & 17  . Colon cancer Neg Hx     Review of Systems: Review of Systems  Constitutional: Negative for chills and fever.  HENT: Negative for hearing loss.   Respiratory: Negative for shortness of breath.   Cardiovascular: Negative for chest pain.  Gastrointestinal: Positive for abdominal pain (discomfort at umbilicus). Negative for constipation, diarrhea, nausea and vomiting.  Genitourinary: Negative for dysuria.  Musculoskeletal: Negative for myalgias.  Skin: Negative for rash.  Neurological: Negative for dizziness.  Psychiatric/Behavioral: Negative for depression.    Physical Exam BP (!) 146/80   Pulse (!) 56   Temp 98.4 F (36.9 C)   Ht 5\' 10"  (1.778 m)   Wt 192 lb (87.1 kg)   SpO2 97%   BMI 27.55 kg/m  CONSTITUTIONAL: No acute distress HEENT:  Normocephalic, atraumatic, extraocular motion intact. NECK: Trachea is midline, and there is no jugular venous distension.  RESPIRATORY:  Lungs are clear, and breath sounds are equal bilaterally. Normal respiratory effort without pathologic use of accessory muscles. CARDIOVASCULAR: Heart is  regular without murmurs, gallops, or rubs. GI: The abdomen is soft, non-distended, non-tender.  The patient has a small defect at the umbilicus resulting in palpable protrusion of what appears to be preperitoneal fat.  No significant tenderness to palpation, and able to be reduced.  There were no inguinal hernias. MUSCULOSKELETAL:  Normal muscle strength and tone in all four extremities.  No peripheral edema or cyanosis. SKIN: Skin turgor is normal. There are no pathologic skin lesions.  NEUROLOGIC:  Motor and sensation is grossly normal.  Cranial nerves are grossly intact. PSYCH:  Alert and oriented to person, place and time. Affect is normal.  Laboratory Analysis: No results found for this or any previous visit (from the past 24 hour(s)).  Imaging: No results found.  Assessment and Plan: This is a 55 y.o. male with a small umbilical hernia.  --Discussed with the patient that he does have a small umbilical hernia.  It is reducible and overall with only minimal symptoms.  He's still being able to do his exercises without significant discomfort.  Discussed with him what a hernia is and that there is no conservative/medical way to get rid of the hernia except through surgery.  Also, the natural progression of a hernia is to enlarge with time and possibly have more symptoms in the future.   --At the time, the patient is not contemplating surgery unless it were truly needed. I discussed with him that given the low symptoms and that his hernia is reducible, there is no specific urgency to doing surgery.  He will think about it more and let us know if he changes his mind or if his symptoms start progressing further.  Discussed with him the potential options for open vs robotic surgery, and currently with the defect being small, would be able to proceed safely with open surgery with a small incision at the umbilicus itself.  Discussed with him the risks and also the post-operative activity  restrictions. --He will follow up with [place] as needed.  Return precautions given.  Face-to-face time spent with the patient and care providers was 60 minutes, with more than 50% of the time spent counseling, educating, and coordinating care of the patient.     Melvyn Neth, [facility]

## 2020-10-07 ENCOUNTER — Encounter: Payer: Self-pay | Admitting: Surgery

## 2020-10-08 ENCOUNTER — Ambulatory Visit: Payer: BC Managed Care – PPO | Admitting: Surgery

## 2020-10-09 ENCOUNTER — Telehealth: Payer: Self-pay | Admitting: Surgery

## 2020-10-09 NOTE — Telephone Encounter (Signed)
Spoke with Randy [name] and he wants to come in to see Dr. Hampton Abbot to discuss his options and to make sure the hernia is not getting bigger. I scheduled him for 10/10/20. Verbalizes understanding.

## 2020-10-09 NOTE — Telephone Encounter (Signed)
Incoming call from the patient.  He last saw Dr. Hampton Abbot on 10/03/20.  At time patient wants to hold off on doing hernia surgery, but now is having increased abdominal pain, gassy and bloated.  Having a lot of discomfort.  Patient has send mychart message but hasn't heard back yet.  Patient states he is also holding off because they have a beach trip planned week of June 11th.  Now just concerned due to the increased pain how urgent this can be if he doesn't have surgery done right away.  I have offered an appointment for him to come in and see Dr. Hampton Abbot, but patient wants call back first.  Please call him.  Thank you.

## 2020-10-10 ENCOUNTER — Ambulatory Visit: Payer: BC Managed Care – PPO | Admitting: Surgery

## 2020-10-10 ENCOUNTER — Other Ambulatory Visit: Payer: Self-pay

## 2020-10-10 ENCOUNTER — Encounter: Payer: Self-pay | Admitting: Surgery

## 2020-10-10 VITALS — BP 133/77 | HR 52 | Temp 98.3°F | Resp 13 | Ht 70.0 in | Wt 192.2 lb

## 2020-10-10 DIAGNOSIS — K429 Umbilical hernia without obstruction or gangrene: Secondary | ICD-10-CM

## 2020-10-10 NOTE — H&P (View-Only) (Signed)
10/10/2020  History of Present Illness: Randy [name] is a 55 y.o. male presenting for follow-up of an umbilical hernia.  The patient was last seen on 5/20 as an initial consult for umbilical hernia.  He comes in today because he has been having worsening abdominal pain in the umbilical region since yesterday.  He also feels gassy and with more discomfort in the surrounding area.  Denies any nausea or vomiting, fevers or chills.  Past Medical History: Past Medical History:  Diagnosis Date  . Family history of ischemic heart disease   . GERD (gastroesophageal reflux disease)   . Hypertension   . Obesity   . Psoriasis      Past Surgical History: Past Surgical History:  Procedure Laterality Date  . CYSTECTOMY     left chest  . superficial vein removed Bilateral   . TOE SURGERY     right great toe    Home Medications: Prior to Admission medications   Medication Sig Start Date End Date Taking? Authorizing Provider  amLODipine (NORVASC) 10 MG tablet Take 1 tablet (10 mg total) by mouth daily. 06/19/20  Yes Denita Lung, MD  aspirin 81 MG tablet Take 81 mg by mouth daily.   Yes [provider]  finasteride (PROSCAR) 5 MG tablet TAKE 1 TABLET BY MOUTH EVERY DAY Patient taking differently: Patient takes 1/2 tablet 06/16/20  Yes Denita Lung, MD  loratadine (CLARITIN) 10 MG tablet Take 10 mg by mouth daily.   Yes [provider]  Multiple Vitamin (MULTIVITAMIN WITH MINERALS) TABS Take 1 tablet by mouth daily.   Yes [provider]  Omega-3 Fatty Acids (FISH OIL PO) Take 2 tablets by mouth daily.    Yes [provider]  PROAIR RESPICLICK 623 (90 Base) MCG/ACT AEPB INHALE 2 PUFFS INTO THE LUNGS 4 (FOUR) TIMES DAILY AS NEEDED. 02/13/18  Yes Denita Lung, MD  rosuvastatin (CRESTOR) 20 MG tablet Take 1 tablet (20 mg total) by mouth daily. 06/19/20  Yes Denita Lung, MD    Allergies: No Known Allergies  Review of Systems: Review of Systems   Constitutional: Negative for chills and fever.  HENT: Negative for hearing loss.   Respiratory: Negative for shortness of breath.   Cardiovascular: Negative for chest pain.  Gastrointestinal: Positive for abdominal pain. Negative for constipation, diarrhea, nausea and vomiting.  Genitourinary: Negative for dysuria.  Musculoskeletal: Negative for myalgias.  Skin: Negative for rash.  Neurological: Negative for dizziness.  Psychiatric/Behavioral: Negative for depression.    Physical Exam BP 133/77   Pulse (!) 52   Temp 98.3 F (36.8 C)   Resp 13   Ht 5\' 10"  (1.778 m)   Wt 192 lb 3.2 oz (87.2 kg)   SpO2 99%   BMI 27.58 kg/m  CONSTITUTIONAL: No acute distress, well-nourished HEENT:  Normocephalic, atraumatic, extraocular motion intact. NECK: Trachea is at midline with no deviation, no jugular distention RESPIRATORY:  Normal respiratory effort without pathologic use of accessory muscles. CARDIOVASCULAR: Regular rhythm and rate GI: The abdomen is soft, nondistended, with focal tenderness at the umbilical area.  The patient has an umbilical hernia which is tender to palpation.  This was reduced at bedside without complications.  The overlying skin was not erythematous or indurated.  The hernia defect is small likely containing fatty tissue, possibly omentum.  No other hernias palpable. SKIN: No pathologic skin lesions noted. MUSCULOSKELETAL: Normal gait without any peripheral edema. NEUROLOGIC:  Motor and sensation is grossly normal.  Cranial nerves  are grossly intact. PSYCH:  Alert and oriented to person, place and time. Affect is normal.  Labs/Imaging: None new  Assessment and Plan: This is a 55 y.o. male with an increasingly symptomatic umbilical hernia.  - Discussed with the patient that given his increasing symptoms, it would be prudent to proceed with surgery sooner rather than later.  His hernia does remain reducible, but the patient was tender in trying to reduce it.  Once  the hernia was reduced, the patient felt much better.  Initially we had potentially try to wait until the wintertime but I think it would be better to schedule him sooner.  He is planning on going on a vacation starting on June 11, so I think it would be best to try to do this next week.  Discussed with him the surgery at length which would be an open umbilical hernia repair.  Reviewed with him the risks of bleeding, infection, injury to surrounding structures.  Discussed with him the possibility of using mesh depending on the size of the defect itself.  Also discussed with him postoperative recovery, restrictions, and pain control. - We will schedule his surgery for June 2 which will give [place] enough time for him to recover and be able to still go on his vacation.  We will see him in postop right before his vacation to make sure the wound is healing well.  All questions have been answered.  Face-to-face time spent with the patient and care providers was 40 minutes, with more than 50% of the time spent counseling, educating, and coordinating care of the patient.     Melvyn Neth, [facility]

## 2020-10-10 NOTE — Progress Notes (Signed)
10/10/2020  History of Present Illness: Randy [name] is a 55 y.o. male presenting for follow-up of an umbilical hernia.  The patient was last seen on 5/20 as an initial consult for umbilical hernia.  He comes in today because he has been having worsening abdominal pain in the umbilical region since yesterday.  He also feels gassy and with more discomfort in the surrounding area.  Denies any nausea or vomiting, fevers or chills.  Past Medical History: Past Medical History:  Diagnosis Date  . Family history of ischemic heart disease   . GERD (gastroesophageal reflux disease)   . Hypertension   . Obesity   . Psoriasis      Past Surgical History: Past Surgical History:  Procedure Laterality Date  . CYSTECTOMY     left chest  . superficial vein removed Bilateral   . TOE SURGERY     right great toe    Home Medications: Prior to Admission medications   Medication Sig Start Date End Date Taking? Authorizing Provider  amLODipine (NORVASC) 10 MG tablet Take 1 tablet (10 mg total) by mouth daily. 06/19/20  Yes Denita Lung, MD  aspirin 81 MG tablet Take 81 mg by mouth daily.   Yes [provider]  finasteride (PROSCAR) 5 MG tablet TAKE 1 TABLET BY MOUTH EVERY DAY Patient taking differently: Patient takes 1/2 tablet 06/16/20  Yes Denita Lung, MD  loratadine (CLARITIN) 10 MG tablet Take 10 mg by mouth daily.   Yes [provider]  Multiple Vitamin (MULTIVITAMIN WITH MINERALS) TABS Take 1 tablet by mouth daily.   Yes [provider]  Omega-3 Fatty Acids (FISH OIL PO) Take 2 tablets by mouth daily.    Yes [provider]  PROAIR RESPICLICK 330 (90 Base) MCG/ACT AEPB INHALE 2 PUFFS INTO THE LUNGS 4 (FOUR) TIMES DAILY AS NEEDED. 02/13/18  Yes Denita Lung, MD  rosuvastatin (CRESTOR) 20 MG tablet Take 1 tablet (20 mg total) by mouth daily. 06/19/20  Yes Denita Lung, MD    Allergies: No Known Allergies  Review of Systems: Review of Systems   Constitutional: Negative for chills and fever.  HENT: Negative for hearing loss.   Respiratory: Negative for shortness of breath.   Cardiovascular: Negative for chest pain.  Gastrointestinal: Positive for abdominal pain. Negative for constipation, diarrhea, nausea and vomiting.  Genitourinary: Negative for dysuria.  Musculoskeletal: Negative for myalgias.  Skin: Negative for rash.  Neurological: Negative for dizziness.  Psychiatric/Behavioral: Negative for depression.    Physical Exam BP 133/77   Pulse (!) 52   Temp 98.3 F (36.8 C)   Resp 13   Ht 5\' 10"  (1.778 m)   Wt 192 lb 3.2 oz (87.2 kg)   SpO2 99%   BMI 27.58 kg/m  CONSTITUTIONAL: No acute distress, well-nourished HEENT:  Normocephalic, atraumatic, extraocular motion intact. NECK: Trachea is at midline with no deviation, no jugular distention RESPIRATORY:  Normal respiratory effort without pathologic use of accessory muscles. CARDIOVASCULAR: Regular rhythm and rate GI: The abdomen is soft, nondistended, with focal tenderness at the umbilical area.  The patient has an umbilical hernia which is tender to palpation.  This was reduced at bedside without complications.  The overlying skin was not erythematous or indurated.  The hernia defect is small likely containing fatty tissue, possibly omentum.  No other hernias palpable. SKIN: No pathologic skin lesions noted. MUSCULOSKELETAL: Normal gait without any peripheral edema. NEUROLOGIC:  Motor and sensation is grossly normal.  Cranial nerves  are grossly intact. PSYCH:  Alert and oriented to person, place and time. Affect is normal.  Labs/Imaging: None new  Assessment and Plan: This is a 55 y.o. male with an increasingly symptomatic umbilical hernia.  - Discussed with the patient that given his increasing symptoms, it would be prudent to proceed with surgery sooner rather than later.  His hernia does remain reducible, but the patient was tender in trying to reduce it.  Once  the hernia was reduced, the patient felt much better.  Initially we had potentially try to wait until the wintertime but I think it would be better to schedule him sooner.  He is planning on going on a vacation starting on June 11, so I think it would be best to try to do this next week.  Discussed with him the surgery at length which would be an open umbilical hernia repair.  Reviewed with him the risks of bleeding, infection, injury to surrounding structures.  Discussed with him the possibility of using mesh depending on the size of the defect itself.  Also discussed with him postoperative recovery, restrictions, and pain control. - We will schedule his surgery for June 2 which will give [place] enough time for him to recover and be able to still go on his vacation.  We will see him in postop right before his vacation to make sure the wound is healing well.  All questions have been answered.  Face-to-face time spent with the patient and care providers was 40 minutes, with more than 50% of the time spent counseling, educating, and coordinating care of the patient.     Melvyn Neth, [facility]

## 2020-10-10 NOTE — Patient Instructions (Addendum)
You have requested for your Umbilical Hernia be repaired. This will be scheduled on 10/16/20  with Dr. Hampton Abbot at [facility].  Please see your (blue)pre-care sheet for information.  You will need to arrange to be off work for 1-2 weeks but will have to have a lifting restriction of no more than 15-20 lbs for 6 weeks following your surgery.  Start taking stool softeners and increase your fiber intake along with increasing your water intake to avoid constipation before and after surgery.    Umbilical Hernia, Adult  A hernia is a bulge of tissue that pushes through an opening between muscles. An umbilical hernia happens in the abdomen, near the belly button (umbilicus). The hernia may contain tissues from the small intestine, large intestine, or fatty tissue covering the intestines (omentum). Umbilical hernias in adults tend to get worse over time, and they require surgical treatment. There are several types of umbilical hernias. You may have:  A hernia located just above or below the umbilicus (indirect hernia). This is the most common type of umbilical hernia in adults.  A hernia that forms through an opening formed by the umbilicus (direct hernia).  A hernia that comes and goes (reducible hernia). A reducible hernia may be visible only when you strain, lift something heavy, or cough. This type of hernia can be pushed back into the abdomen (reduced).  A hernia that traps abdominal tissue inside the hernia (incarcerated hernia). This type of hernia cannot be reduced.  A hernia that cuts off blood flow to the tissues inside the hernia (strangulated hernia). The tissues can start to die if this happens. This type of hernia requires emergency treatment. What are the causes? An umbilical hernia happens when tissue inside the abdomen presses on a weak area of the abdominal muscles. What increases the risk? You may have a greater risk of this condition if you:  Are obese.  Have had several  pregnancies.  Have a buildup of fluid inside your abdomen (ascites).  Have had surgery that weakens the abdominal muscles. What are the signs or symptoms? The main symptom of this condition is a painless bulge at or near the belly button. A reducible hernia may be visible only when you strain, lift something heavy, or cough. Other symptoms may include:  Dull pain.  A feeling of pressure. Symptoms of a strangulated hernia may include:  Pain that gets increasingly worse.  Nausea and vomiting.  Pain when pressing on the hernia.  Skin over the hernia becoming red or purple.  Constipation.  Blood in the stool. How is this diagnosed? This condition may be diagnosed based on:  A physical exam. You may be asked to cough or strain while standing. These actions increase the pressure inside your abdomen and force the hernia through the opening in your muscles. Your health care provider may try to reduce the hernia by pressing on it.  Your symptoms and medical history. How is this treated? Surgery is the only treatment for an umbilical hernia. Surgery for a strangulated hernia is done as soon as possible. If you have a small hernia that is not incarcerated, you may need to lose weight before having surgery. Follow these instructions at home:  Lose weight, if told by your health care provider.  Do not try to push the hernia back in.  Watch your hernia for any changes in color or size. Tell your health care provider if any changes occur.  You may need to avoid activities that increase  pressure on your hernia.  Take over-the-counter and prescription medicines only as told by your health care provider.  Keep all follow-up visits as told by your health care provider. This is important. Contact a health care provider if:  Your hernia gets larger.  Your hernia becomes painful. Get help right away if:  You develop sudden, severe pain near the area of your hernia.  You have pain as  well as nausea or vomiting.  You have pain and the skin over your hernia changes color.  You develop a fever. This information is not intended to replace advice given to you by your health care provider. Make sure you discuss any questions you have with your health care provider. Document Revised: 06/15/2017 Document Reviewed: 11/01/2016 Elsevier Patient Education  [place].

## 2020-10-14 ENCOUNTER — Telehealth: Payer: Self-pay | Admitting: Surgery

## 2020-10-14 NOTE — Telephone Encounter (Signed)
Outgoing call is made, left message to call.  Please advise patient of Pre-Admission date/time, COVID Testing date and Surgery date.  Surgery Date: 10/16/20 Preadmission Testing Date: 10/15/20 (phone 8a-1p) Covid Testing Date: Not needed.   Also patient needs to call at [phone], between 1-3:00pm the day before surgery, to find out what time to arrive for surgery.

## 2020-10-14 NOTE — Telephone Encounter (Signed)
Patient returns call, he is now informed of all information regarding his surgery and verbalized understanding.

## 2020-10-15 ENCOUNTER — Encounter
Admission: RE | Admit: 2020-10-15 | Discharge: 2020-10-15 | Disposition: A | Discharge status: Home / Self Care | Payer: BC Managed Care – PPO | Source: Ambulatory Visit | Attending: Surgery | Admitting: Surgery

## 2020-10-15 ENCOUNTER — Other Ambulatory Visit: Payer: Self-pay

## 2020-10-15 HISTORY — DX: Other seasonal allergic rhinitis: J30.2

## 2020-10-15 HISTORY — DX: Personal history of other diseases of the digestive system: Z87.19

## 2020-10-15 HISTORY — DX: Venous insufficiency (chronic) (peripheral): I87.2

## 2020-10-15 HISTORY — DX: Simple febrile convulsions: R56.00

## 2020-10-15 NOTE — Patient Instructions (Addendum)
Your procedure is scheduled on:10-16-20 Report to the Registration Desk on the 1st floor of the [facility]. To find out your arrival time, please call [phone] between 1PM - 3PM on:10-15-20  REMEMBER: Instructions that are not followed completely may result in serious medical risk, up to and including death; or upon the discretion of your surgeon and anesthesiologist your surgery may need to be rescheduled.  Do not eat food after midnight the night before surgery.  No gum chewing, lozengers or hard candies.  You may however, drink CLEAR liquids up to 2 hours before you are scheduled to arrive for your surgery. Do not drink anything within 2 hours of your scheduled arrival time.  Clear liquids include: - water  - apple juice without pulp - gatorade (not RED, PURPLE, OR BLUE) - black coffee or tea (Do NOT add milk or creamers to the coffee or tea) Do NOT drink anything that is not on this list.  TAKE THESE MEDICATIONS THE MORNING OF SURGERY WITH A SIP OF WATER: -NORVASC (AMLODIPINE) -PROSCAR (FINASTERIDE) -CLARITIN (LORATADINE)  LAST HAD 81 MG ASPIRIN TODAY (10-15-20)-DO NOT TAKE ASPIRIN MORNING OF SURGERY  BRING YOUR PROAIR INHALER TO [facility]  One week prior to surgery: Stop Anti-inflammatories (NSAIDS) such as Advil, Aleve, Ibuprofen, Motrin, Naproxen, Naprosyn and Aspirin based products such as Excedrin, Goodys Powder, BC Powder.You may however, continue to take Tylenol if needed for pain up until the day of surgery.  Stop ANY OVER THE COUNTER supplements until after surgery-LAST HAD FISH OIL AND MULTIVITAMIN TODAY (10-15-20)-DO NOT TAKE THESE THE MORNING OF SURGERY  No Alcohol for 24 hours before or after surgery.  No Smoking including e-cigarettes for 24 hours prior to surgery.  No chewable tobacco products for at least 6 hours prior to surgery.  No nicotine patches on the day of surgery.  Do not use any "recreational" drugs for at least a week prior to your surgery.   Please be advised that the combination of cocaine and anesthesia may have negative outcomes, up to and including death. If you test positive for cocaine, your surgery will be cancelled.  On the morning of surgery brush your teeth with toothpaste and water, you may rinse your mouth with mouthwash if you wish. Do not swallow any toothpaste or mouthwash.  Do not wear jewelry, make-up, hairpins, clips or nail polish.  Do not wear lotions, powders, or perfumes.   Do not shave body from the neck down 48 hours prior to surgery just in case you cut yourself which could leave a site for infection.  Also, freshly shaved skin may become irritated if using the CHG soap.  Contact lenses, hearing aids and dentures may not be worn into surgery.  Do not bring valuables to the hospital. [facility] is not responsible for any missing/lost belongings or valuables.   Notify your doctor if there is any change in your medical condition (cold, fever, infection).  Wear comfortable clothing (specific to your surgery type) to the hospital.  Plan for stool softeners for home use; pain medications have a tendency to cause constipation. You can also help prevent constipation by eating foods high in fiber such as fruits and vegetables and drinking plenty of fluids as your diet allows.  After surgery, you can help prevent lung complications by doing breathing exercises.  Take deep breaths and cough every 1-2 hours. Your doctor may order a device called an Incentive Spirometer to help you take deep breaths. When coughing or sneezing, hold  a pillow firmly against your incision with both hands. This is called "splinting." Doing this helps protect your incision. It also decreases belly discomfort.  If you are being admitted to the hospital overnight, leave your suitcase in the car. After surgery it may be brought to your room.  If you are being discharged the day of surgery, you will not be allowed to drive home. You  will need a responsible adult (18 years or older) to drive you home and stay with you that night.   If you are taking public transportation, you will need to have a responsible adult (18 years or older) with you. Please confirm with your physician that it is acceptable to use public transportation.   Please call the [place] [facility] at [phone] if you have any questions about these instructions.  Surgery Visitation Policy:  Patients undergoing a surgery or procedure may have one family member or support person with them as long as that person is not COVID-19 positive or experiencing its symptoms.  That person may remain in the waiting area during the procedure.  Inpatient Visitation:    Visiting hours are 7 a.m. to 8 p.m. Inpatients will be allowed two visitors daily. The visitors may change each day during the patient's stay. No visitors under the age of 31. Any visitor under the age of 67 must be accompanied by an adult. The visitor must pass COVID-19 screenings, use hand sanitizer when entering and exiting the patient's room and wear a mask at all times, including in the patient's room. Patients must also wear a mask when staff or their visitor are in the room. Masking is required regardless of vaccination status.

## 2020-10-16 ENCOUNTER — Ambulatory Visit
Admission: RE | Admit: 2020-10-16 | Discharge: 2020-10-16 | Disposition: A | Discharge status: Home / Self Care | Payer: BC Managed Care – PPO | Attending: Surgery | Admitting: Surgery

## 2020-10-16 ENCOUNTER — Other Ambulatory Visit: Payer: Self-pay

## 2020-10-16 ENCOUNTER — Ambulatory Visit: Payer: BC Managed Care – PPO | Admitting: Urgent Care

## 2020-10-16 ENCOUNTER — Encounter: Payer: Self-pay | Admitting: Surgery

## 2020-10-16 ENCOUNTER — Encounter
Admission: RE | Disposition: A | Discharge status: Home / Self Care | Payer: Self-pay | Source: Home / Self Care | Attending: Surgery

## 2020-10-16 DIAGNOSIS — Z7982 Long term (current) use of aspirin: Secondary | ICD-10-CM | POA: Insufficient documentation

## 2020-10-16 DIAGNOSIS — K429 Umbilical hernia without obstruction or gangrene: Secondary | ICD-10-CM

## 2020-10-16 DIAGNOSIS — Z79899 Other long term (current) drug therapy: Secondary | ICD-10-CM | POA: Insufficient documentation

## 2020-10-16 HISTORY — PX: UMBILICAL HERNIA REPAIR: SHX196

## 2020-10-16 SURGERY — REPAIR, HERNIA, UMBILICAL, ADULT
Anesthesia: General

## 2020-10-16 MED ORDER — KETOROLAC TROMETHAMINE 30 MG/ML IJ SOLN
INTRAMUSCULAR | Status: DC | PRN
  Administered 2020-10-16: 30 mg via INTRAVENOUS

## 2020-10-16 MED ORDER — MIDAZOLAM HCL 2 MG/2ML IJ SOLN
INTRAMUSCULAR | Status: AC
  Filled 2020-10-16: qty 2

## 2020-10-16 MED ORDER — PROPOFOL 10 MG/ML IV BOLUS
INTRAVENOUS | Status: AC
  Filled 2020-10-16: qty 20

## 2020-10-16 MED ORDER — KETOROLAC TROMETHAMINE 30 MG/ML IJ SOLN: 30.0000 mg | Freq: Once | INTRAMUSCULAR | Status: DC | PRN

## 2020-10-16 MED ORDER — ACETAMINOPHEN 160 MG/5ML PO SOLN
325.0000 mg | ORAL | Status: DC | PRN
  Filled 2020-10-16: qty 20.3

## 2020-10-16 MED ORDER — SUGAMMADEX SODIUM 500 MG/5ML IV SOLN
INTRAVENOUS | Status: DC | PRN
  Administered 2020-10-16: 400 mg via INTRAVENOUS

## 2020-10-16 MED ORDER — CEFAZOLIN SODIUM-DEXTROSE 2-4 GM/100ML-% IV SOLN
INTRAVENOUS | Status: AC
  Filled 2020-10-16: qty 100

## 2020-10-16 MED ORDER — ACETAMINOPHEN 500 MG PO TABS
ORAL_TABLET | ORAL | Status: AC
  Administered 2020-10-16: 1000 mg via ORAL
  Filled 2020-10-16: qty 2

## 2020-10-16 MED ORDER — EPHEDRINE SULFATE 50 MG/ML IJ SOLN
INTRAMUSCULAR | Status: DC | PRN
  Administered 2020-10-16: 10 mg via INTRAVENOUS
  Administered 2020-10-16: 5 mg via INTRAVENOUS

## 2020-10-16 MED ORDER — OXYCODONE HCL 5 MG PO TABS: 5.0000 mg | ORAL_TABLET | ORAL | 0 refills | Status: DC | PRN

## 2020-10-16 MED ORDER — GABAPENTIN 300 MG PO CAPS: 300.0000 mg | ORAL_CAPSULE | ORAL | Status: AC

## 2020-10-16 MED ORDER — HYDROCODONE-ACETAMINOPHEN 7.5-325 MG PO TABS: 1.0000 | ORAL_TABLET | Freq: Once | ORAL | Status: DC | PRN

## 2020-10-16 MED ORDER — ROCURONIUM BROMIDE 100 MG/10ML IV SOLN
INTRAVENOUS | Status: DC | PRN
  Administered 2020-10-16: 40 mg via INTRAVENOUS
  Administered 2020-10-16 (×2): 10 mg via INTRAVENOUS

## 2020-10-16 MED ORDER — BACITRACIN ZINC 500 UNIT/GM EX OINT
TOPICAL_OINTMENT | CUTANEOUS | Status: AC
  Filled 2020-10-16: qty 28.35

## 2020-10-16 MED ORDER — ACETAMINOPHEN 500 MG PO TABS: 1000.0000 mg | ORAL_TABLET | Freq: Four times a day (QID) | ORAL | Status: AC | PRN

## 2020-10-16 MED ORDER — FENTANYL CITRATE (PF) 100 MCG/2ML IJ SOLN: 25.0000 ug | INTRAMUSCULAR | Status: DC | PRN

## 2020-10-16 MED ORDER — GABAPENTIN 300 MG PO CAPS
ORAL_CAPSULE | ORAL | Status: AC
  Administered 2020-10-16: 300 mg via ORAL
  Filled 2020-10-16: qty 1

## 2020-10-16 MED ORDER — BUPIVACAINE-EPINEPHRINE (PF) 0.5% -1:200000 IJ SOLN
INTRAMUSCULAR | Status: DC | PRN
  Administered 2020-10-16: 30 mL via PERINEURAL

## 2020-10-16 MED ORDER — FAMOTIDINE 20 MG PO TABS
20.0000 mg | ORAL_TABLET | Freq: Once | ORAL | Status: AC
  Administered 2020-10-16: 20 mg via ORAL

## 2020-10-16 MED ORDER — CEFAZOLIN SODIUM-DEXTROSE 2-4 GM/100ML-% IV SOLN
2.0000 g | INTRAVENOUS | Status: AC
  Administered 2020-10-16: 2 g via INTRAVENOUS

## 2020-10-16 MED ORDER — ACETAMINOPHEN 325 MG PO TABS: 325.0000 mg | ORAL_TABLET | ORAL | Status: DC | PRN

## 2020-10-16 MED ORDER — BUPIVACAINE-EPINEPHRINE (PF) 0.5% -1:200000 IJ SOLN
INTRAMUSCULAR | Status: AC
  Filled 2020-10-16: qty 30

## 2020-10-16 MED ORDER — SUCCINYLCHOLINE CHLORIDE 20 MG/ML IJ SOLN
INTRAMUSCULAR | Status: DC | PRN
  Administered 2020-10-16: 100 mg via INTRAVENOUS

## 2020-10-16 MED ORDER — CHLORHEXIDINE GLUCONATE CLOTH 2 % EX PADS: 6.0000 | MEDICATED_PAD | Freq: Once | CUTANEOUS | Status: DC

## 2020-10-16 MED ORDER — PROPOFOL 10 MG/ML IV BOLUS
INTRAVENOUS | Status: DC | PRN
  Administered 2020-10-16: 200 mg via INTRAVENOUS

## 2020-10-16 MED ORDER — FENTANYL CITRATE (PF) 100 MCG/2ML IJ SOLN
INTRAMUSCULAR | Status: AC
  Filled 2020-10-16: qty 2

## 2020-10-16 MED ORDER — LACTATED RINGERS IV SOLN: INTRAVENOUS | Status: DC

## 2020-10-16 MED ORDER — ORAL CARE MOUTH RINSE: 15.0000 mL | Freq: Once | OROMUCOSAL | Status: AC

## 2020-10-16 MED ORDER — FAMOTIDINE 20 MG PO TABS
ORAL_TABLET | ORAL | Status: AC
  Filled 2020-10-16: qty 1

## 2020-10-16 MED ORDER — MIDAZOLAM HCL 2 MG/2ML IJ SOLN
INTRAMUSCULAR | Status: DC | PRN
  Administered 2020-10-16: 2 mg via INTRAVENOUS

## 2020-10-16 MED ORDER — DEXAMETHASONE SODIUM PHOSPHATE 10 MG/ML IJ SOLN
INTRAMUSCULAR | Status: DC | PRN
  Administered 2020-10-16: 10 mg via INTRAVENOUS

## 2020-10-16 MED ORDER — LIDOCAINE HCL (CARDIAC) PF 100 MG/5ML IV SOSY
PREFILLED_SYRINGE | INTRAVENOUS | Status: DC | PRN
  Administered 2020-10-16: 100 mg via INTRAVENOUS

## 2020-10-16 MED ORDER — PHENYLEPHRINE HCL (PRESSORS) 10 MG/ML IV SOLN
INTRAVENOUS | Status: DC | PRN
  Administered 2020-10-16 (×2): 100 ug via INTRAVENOUS

## 2020-10-16 MED ORDER — DROPERIDOL 2.5 MG/ML IJ SOLN
0.6250 mg | Freq: Once | INTRAMUSCULAR | Status: DC | PRN
  Filled 2020-10-16: qty 2

## 2020-10-16 MED ORDER — CHLORHEXIDINE GLUCONATE 0.12 % MT SOLN
OROMUCOSAL | Status: AC
  Administered 2020-10-16: 15 mL via OROMUCOSAL
  Filled 2020-10-16: qty 15

## 2020-10-16 MED ORDER — BUPIVACAINE LIPOSOME 1.3 % IJ SUSP: 20.0000 mL | Freq: Once | INTRAMUSCULAR | Status: DC

## 2020-10-16 MED ORDER — ACETAMINOPHEN 500 MG PO TABS: 1000.0000 mg | ORAL_TABLET | ORAL | Status: AC

## 2020-10-16 MED ORDER — FENTANYL CITRATE (PF) 100 MCG/2ML IJ SOLN
INTRAMUSCULAR | Status: DC | PRN
  Administered 2020-10-16 (×2): 50 ug via INTRAVENOUS

## 2020-10-16 MED ORDER — IBUPROFEN 600 MG PO TABS: 600.0000 mg | ORAL_TABLET | Freq: Three times a day (TID) | ORAL | 1 refills | Status: AC | PRN

## 2020-10-16 MED ORDER — ONDANSETRON HCL 4 MG/2ML IJ SOLN
INTRAMUSCULAR | Status: DC | PRN
  Administered 2020-10-16 (×2): 4 mg via INTRAVENOUS

## 2020-10-16 MED ORDER — GLYCOPYRROLATE 0.2 MG/ML IJ SOLN
INTRAMUSCULAR | Status: DC | PRN
  Administered 2020-10-16: .2 mg via INTRAVENOUS

## 2020-10-16 MED ORDER — PROMETHAZINE HCL 25 MG/ML IJ SOLN: 6.2500 mg | INTRAMUSCULAR | Status: DC | PRN

## 2020-10-16 MED ORDER — CHLORHEXIDINE GLUCONATE 0.12 % MT SOLN: 15.0000 mL | Freq: Once | OROMUCOSAL | Status: AC

## 2020-10-16 SURGICAL SUPPLY — 35 items
ADH SKN CLS APL DERMABOND .7 (GAUZE/BANDAGES/DRESSINGS) ×1
APL PRP STRL LF DISP 70% ISPRP (MISCELLANEOUS) ×1
BLADE SURG 15 STRL LF DISP TIS (BLADE) ×1 IMPLANT
BLADE SURG 15 STRL SS (BLADE) ×2
CANISTER SUCT 1200ML W/VALVE (MISCELLANEOUS) ×2 IMPLANT
CHLORAPREP W/TINT 26 (MISCELLANEOUS) ×2 IMPLANT
COVER WAND RF STERILE (DRAPES) ×2 IMPLANT
DERMABOND ADVANCED (GAUZE/BANDAGES/DRESSINGS) ×1
DERMABOND ADVANCED .7 DNX12 (GAUZE/BANDAGES/DRESSINGS) ×1 IMPLANT
DRAPE LAPAROTOMY 77X122 PED (DRAPES) ×2 IMPLANT
ELECT CAUTERY BLADE TIP 2.5 (TIP) ×2
ELECT REM PT RETURN 9FT ADLT (ELECTROSURGICAL) ×2
ELECTRODE CAUTERY BLDE TIP 2.5 (TIP) ×1 IMPLANT
ELECTRODE REM PT RTRN 9FT ADLT (ELECTROSURGICAL) ×1 IMPLANT
GLOVE SURG SYN 7.0 (GLOVE) ×6 IMPLANT
GLOVE SURG SYN 7.0 PF PI (GLOVE) ×1 IMPLANT
GLOVE SURG SYN 7.5  E (GLOVE) ×6
GLOVE SURG SYN 7.5 E (GLOVE) ×3 IMPLANT
GLOVE SURG SYN 7.5 PF PI (GLOVE) ×1 IMPLANT
GOWN STRL REUS W/ TWL LRG LVL3 (GOWN DISPOSABLE) ×3 IMPLANT
GOWN STRL REUS W/TWL LRG LVL3 (GOWN DISPOSABLE) ×6
MANIFOLD NEPTUNE II (INSTRUMENTS) ×2 IMPLANT
MESH VENTRALEX ST 2.5 CRC MED (Mesh General) ×1 IMPLANT
NEEDLE HYPO 22GX1.5 SAFETY (NEEDLE) ×2 IMPLANT
NS IRRIG 500ML POUR BTL (IV SOLUTION) ×2 IMPLANT
PACK BASIN MINOR ARMC (MISCELLANEOUS) ×2 IMPLANT
SUT ETHIBOND 0 MO6 C/R (SUTURE) ×2 IMPLANT
SUT MNCRL AB 4-0 PS2 18 (SUTURE) ×2 IMPLANT
SUT PROLENE 2 0 SH DA (SUTURE) ×2 IMPLANT
SUT VIC AB 2-0 SH 27 (SUTURE) ×2
SUT VIC AB 2-0 SH 27XBRD (SUTURE) ×1 IMPLANT
SUT VIC AB 3-0 SH 27 (SUTURE) ×2
SUT VIC AB 3-0 SH 27X BRD (SUTURE) ×1 IMPLANT
SYR 20ML LL LF (SYRINGE) ×2 IMPLANT
SYR BULB IRRIG 60ML STRL (SYRINGE) ×2 IMPLANT

## 2020-10-16 NOTE — Op Note (Signed)
Procedure Date:  10/16/2020  Pre-operative Diagnosis:  Umbilical hernia  Post-operative Diagnosis:  Umbilical hernia  Procedure:  Umbilical hernia repair with mesh  Surgeon:  Melvyn Neth, MD  Assistant:  Silvio Pate, PA-S  Anesthesia:  General endotracheal  Estimated Blood Loss:  5 ml  Specimens:  None  Complications:  None  Indications for Procedure:  This is a 55 y.o. male who presents with a symptomatic umbilical hernia.  The risks of bleeding, abscess or infection, injury to surrounding structures, and need for further procedures were all discussed with the patient and was willing to proceed.  Description of Procedure: The patient was correctly identified in the preoperative area and brought into the operating room.  The patient was placed supine with VTE prophylaxis in place.  Appropriate time-outs were performed.  Anesthesia was induced and the patient was intubated.  Appropriate antibiotics were infused.  The abdomen was prepped and draped in a sterile fashion.  An infraumbilical incision was made and cautery was used to dissect down the subcutaneous tissue along the umbilical stalk.  Kelly forceps were used to dissect the umbilical stalk and it was divided at the fascia using cautery.  This allowed visualization of the hernia defect.  The hernia contents consisted of preperitoneal fat.  This was resected without complications and allowed for better exposure of the defect.  This measured about 1 cm.  The fascial edges were cleaned using cautery and the defect was extended for mesh insertion.  A 6.4 cm Ventralex ST hernia patch mesh was introduced via the defect and spread open.  The tails were secured to the fascia using 2-0 Prolene sutures.  The fascia was then closed with 0 Ethibond sutures, incorporating a layer of the mesh with each suture.  The umbilical stalk was then reattached to the fascia using 2-0 Vicryl. The wound was irrigated and local anesthetic was infused.  The  wound was then closed in layers using 2-0 Vicryl, 3-0 Vicryl and 4-0 Monocryl. The incision was cleaned and sealed with DermaBond.  The patient was emerged from anesthesia and extubated and brought to the recovery room for further management.  The patient tolerated the procedure well and all counts were correct at the end of the case.   Melvyn Neth, MD

## 2020-10-16 NOTE — Discharge Instructions (Signed)

## 2020-10-16 NOTE — Anesthesia Procedure Notes (Signed)
Procedure Name: Intubation Performed by: Fletcher-Harrison, Dragon Thrush, CRNA Pre-anesthesia Checklist: Patient identified, Emergency Drugs available, Suction available and Patient being monitored Patient Re-evaluated:Patient Re-evaluated prior to induction Oxygen Delivery Method: Circle system utilized Preoxygenation: Pre-oxygenation with 100% oxygen Induction Type: IV induction Ventilation: Mask ventilation without difficulty Laryngoscope Size: McGraph and 3 Grade View: Grade I Tube type: Oral Tube size: 7.0 mm Number of attempts: 1 Airway Equipment and Method: Stylet and Oral airway Placement Confirmation: ETT inserted through vocal cords under direct vision,  positive ETCO2,  breath sounds checked- equal and bilateral and CO2 detector Secured at: 21 cm Tube secured with: Tape Dental Injury: Teeth and Oropharynx as per pre-operative assessment        

## 2020-10-16 NOTE — Anesthesia Postprocedure Evaluation (Signed)
Anesthesia Post Note  Patient: Randy [name]  Procedure(s) Performed: HERNIA REPAIR UMBILICAL ADULT (N/A )  Patient location during evaluation: PACU Anesthesia Type: General Level of consciousness: awake and alert Pain management: pain level controlled Vital Signs Assessment: post-procedure vital signs reviewed and stable Respiratory status: spontaneous breathing and respiratory function stable Cardiovascular status: stable Anesthetic complications: no   No complications documented.   Last Vitals:  Vitals:   10/16/20 1615 10/16/20 1630  BP: 123/61 129/70  Pulse: 81 75  Resp: 17 16  Temp:    SpO2: 96% 98%    Last Pain:  Vitals:   10/16/20 1209  TempSrc: Oral  PainSc: 0-No pain                 Afsa Meany K

## 2020-10-16 NOTE — Anesthesia Preprocedure Evaluation (Signed)
Anesthesia Evaluation  Patient identified by MRN, date of birth, ID band Patient awake    Reviewed: Allergy & Precautions, H&P , NPO status , reviewed documented beta blocker date and time   Airway Mallampati: II  TM Distance: >3 FB Neck ROM: full    Dental  (+) Teeth Intact   Pulmonary    Pulmonary exam normal        Cardiovascular Exercise Tolerance: Good hypertension, Normal cardiovascular exam  1 AVb on EKG   Neuro/Psych Seizures -,  No seizure problems per pt, no meds    GI/Hepatic hiatal hernia, neg GERD  ,Pt states no GERD sx's   Endo/Other    Renal/GU      Musculoskeletal   Abdominal   Peds  Hematology   Anesthesia Other Findings Past Medical History: No date: Family history of ischemic heart disease No date: Febrile seizure ([place])     Comment:  AS A CHILD No date: GERD (gastroesophageal reflux disease)     Comment:  RARE- NO MEDS No date: History of hiatal hernia     Comment:  SMALL No date: Hypertension No date: Obesity No date: Psoriasis No date: Seasonal allergies No date: Venous insufficiency Past Surgical History: No date: CYSTECTOMY     Comment:  left chest No date: superficial vein removed; Bilateral     Comment:  LEGS No date: TOE SURGERY     Comment:  right great toe BMI    Body Mass Index: 27.26 kg/m     Reproductive/Obstetrics                             Anesthesia Physical Anesthesia Plan  ASA: II  Anesthesia Plan: General   Post-op Pain Management:    Induction: Intravenous  PONV Risk Score and Plan: 2 and Ondansetron, Midazolam and Treatment may vary due to age or medical condition  Airway Management Planned: Oral ETT  Additional Equipment:   Intra-op Plan:   Post-operative Plan: Extubation in OR  Informed Consent: I have reviewed the patients History and Physical, chart, labs and discussed the procedure including the risks, benefits  and alternatives for the proposed anesthesia with the patient or authorized representative who has indicated his/her understanding and acceptance.     Dental Advisory Given  Plan Discussed with: CRNA  Anesthesia Plan Comments:         Anesthesia Quick Evaluation

## 2020-10-16 NOTE — Interval H&P Note (Signed)
History and Physical Interval Note:  10/16/2020 2:19 PM  Randy [name]  has presented today for surgery, with the diagnosis of Symptomatic umbilical hernia.  The various methods of treatment have been discussed with the patient and family. After consideration of risks, benefits and other options for treatment, the patient has consented to  Procedure(s): HERNIA REPAIR UMBILICAL ADULT (N/A) as a surgical intervention.  The patient's history has been reviewed, patient examined, no change in status, stable for surgery.  I have reviewed the patient's chart and labs.  Questions were answered to the patient's satisfaction.     Jasani Dolney

## 2020-10-16 NOTE — Transfer of Care (Signed)
Immediate Anesthesia Transfer of Care Note  Patient: Randy [name]  Procedure(s) Performed: HERNIA REPAIR UMBILICAL ADULT (N/A )  Patient Location: PACU  Anesthesia Type:General  Level of Consciousness: awake, drowsy and patient cooperative  Airway & Oxygen Therapy: Patient Spontanous Breathing and Patient connected to face mask oxygen  Post-op Assessment: Report given to RN and Post -op Vital signs reviewed and stable  Post vital signs: Reviewed and stable  Last Vitals:  Vitals Value Taken Time  BP 123/61 10/16/20 1615  Temp 36.6 C 10/16/20 1611  Pulse 74 10/16/20 1618  Resp 14 10/16/20 1618  SpO2 97 % 10/16/20 1618  Vitals shown include unvalidated device data.  Last Pain:  Vitals:   10/16/20 1209  TempSrc: Oral  PainSc: 0-No pain         Complications: No complications documented.

## 2020-10-17 ENCOUNTER — Encounter: Payer: Self-pay | Admitting: Surgery

## 2020-10-20 ENCOUNTER — Telehealth: Payer: Self-pay | Admitting: Surgery

## 2020-10-20 NOTE — Telephone Encounter (Signed)
Instructed patient to start using Miralax 1 capful twice a day until he has a good bowel movement then he may reduce this to once a day. He may also take Simethicone for the gas. He will call back with any further questions.

## 2020-10-20 NOTE — Telephone Encounter (Signed)
Incoming call from patient.  He had umbilical hernia repair on 10/16/20 with Dr. Hampton Abbot.  He does have post op scheduled for 10/22/20.  Patient states that over the weekend with yesterday being the worse, is having gas pain, bloating, can hear gurgling sounds.  Had a small bowel movement, loose on Saturday, but that has been the only one since surgery.  Patient concerned with all the gas pain he is having.  Please call him, thank you.

## 2020-10-21 ENCOUNTER — Ambulatory Visit (INDEPENDENT_AMBULATORY_CARE_PROVIDER_SITE_OTHER)
Admission: RE | Admit: 2020-10-21 | Discharge: 2020-10-21 | Disposition: A | Discharge status: Home / Self Care | Payer: Self-pay | Source: Ambulatory Visit | Attending: Cardiology | Admitting: Cardiology

## 2020-10-21 ENCOUNTER — Other Ambulatory Visit: Payer: Self-pay

## 2020-10-21 ENCOUNTER — Telehealth: Payer: Self-pay | Admitting: *Deleted

## 2020-10-21 DIAGNOSIS — E7800 Pure hypercholesterolemia, unspecified: Secondary | ICD-10-CM

## 2020-10-21 NOTE — Telephone Encounter (Signed)
Patient called yesterday with complaints of not having a bowel movement. He did have a small one on Saturday but that was the only one since surgery. He was instructed to take Miralax 1 capful twice a day. He took it twice yesterday and once this morning with still no BM. He complains of stomach pain and gurgling. Patient is worried and wants to know what the next steps are. Please call and advise     Surgery was 09/19/29 Dr Hampton Abbot Umbilical hernia

## 2020-10-21 NOTE — Telephone Encounter (Signed)
Spoke with pt and he states he has been constipated since he had surgery and he has tried everything. I advised pt to try magesium citrate. Pt verbalized understanding.

## 2020-10-22 ENCOUNTER — Encounter: Payer: Self-pay | Admitting: Internal Medicine

## 2020-10-22 ENCOUNTER — Ambulatory Visit (INDEPENDENT_AMBULATORY_CARE_PROVIDER_SITE_OTHER): Payer: BC Managed Care – PPO | Admitting: Surgery

## 2020-10-22 ENCOUNTER — Encounter: Payer: Self-pay | Admitting: Urology

## 2020-10-22 ENCOUNTER — Inpatient Hospital Stay
Admission: AD | Admit: 2020-10-22 | Discharge: 2020-10-24 | DRG: 683 | Disposition: A | Discharge status: Home / Self Care | Payer: BC Managed Care – PPO | Source: Ambulatory Visit | Attending: Internal Medicine | Admitting: Internal Medicine

## 2020-10-22 ENCOUNTER — Other Ambulatory Visit
Admission: RE | Admit: 2020-10-22 | Discharge: 2020-10-22 | Disposition: A | Discharge status: Home / Self Care | Payer: BC Managed Care – PPO | Source: Ambulatory Visit | Attending: Surgery | Admitting: Surgery

## 2020-10-22 ENCOUNTER — Inpatient Hospital Stay: Payer: BC Managed Care – PPO

## 2020-10-22 ENCOUNTER — Other Ambulatory Visit: Payer: Self-pay

## 2020-10-22 ENCOUNTER — Telehealth: Payer: Self-pay

## 2020-10-22 ENCOUNTER — Encounter: Payer: Self-pay | Admitting: Physician Assistant

## 2020-10-22 ENCOUNTER — Ambulatory Visit: Payer: BC Managed Care – PPO | Admitting: Urology

## 2020-10-22 VITALS — BP 149/84 | HR 73 | Ht 70.0 in | Wt 190.0 lb

## 2020-10-22 VITALS — BP 171/89 | HR 77 | Temp 98.4°F | Ht 70.0 in | Wt 206.0 lb

## 2020-10-22 DIAGNOSIS — K5903 Drug induced constipation: Secondary | ICD-10-CM | POA: Diagnosis present

## 2020-10-22 DIAGNOSIS — I700 Atherosclerosis of aorta: Secondary | ICD-10-CM | POA: Diagnosis not present

## 2020-10-22 DIAGNOSIS — R339 Retention of urine, unspecified: Secondary | ICD-10-CM | POA: Diagnosis not present

## 2020-10-22 DIAGNOSIS — J302 Other seasonal allergic rhinitis: Secondary | ICD-10-CM | POA: Diagnosis present

## 2020-10-22 DIAGNOSIS — N9989 Other postprocedural complications and disorders of genitourinary system: Secondary | ICD-10-CM | POA: Diagnosis not present

## 2020-10-22 DIAGNOSIS — R1084 Generalized abdominal pain: Secondary | ICD-10-CM | POA: Insufficient documentation

## 2020-10-22 DIAGNOSIS — N179 Acute kidney failure, unspecified: Principal | ICD-10-CM | POA: Diagnosis present

## 2020-10-22 DIAGNOSIS — R109 Unspecified abdominal pain: Secondary | ICD-10-CM | POA: Insufficient documentation

## 2020-10-22 DIAGNOSIS — K429 Umbilical hernia without obstruction or gangrene: Secondary | ICD-10-CM

## 2020-10-22 DIAGNOSIS — R600 Localized edema: Secondary | ICD-10-CM | POA: Diagnosis not present

## 2020-10-22 DIAGNOSIS — R338 Other retention of urine: Secondary | ICD-10-CM | POA: Diagnosis present

## 2020-10-22 DIAGNOSIS — K5900 Constipation, unspecified: Secondary | ICD-10-CM | POA: Diagnosis not present

## 2020-10-22 DIAGNOSIS — E871 Hypo-osmolality and hyponatremia: Secondary | ICD-10-CM | POA: Diagnosis not present

## 2020-10-22 DIAGNOSIS — E860 Dehydration: Secondary | ICD-10-CM | POA: Diagnosis present

## 2020-10-22 DIAGNOSIS — Z79899 Other long term (current) drug therapy: Secondary | ICD-10-CM | POA: Diagnosis not present

## 2020-10-22 DIAGNOSIS — R140 Abdominal distension (gaseous): Secondary | ICD-10-CM

## 2020-10-22 DIAGNOSIS — I10 Essential (primary) hypertension: Secondary | ICD-10-CM | POA: Diagnosis not present

## 2020-10-22 DIAGNOSIS — Z8249 Family history of ischemic heart disease and other diseases of the circulatory system: Secondary | ICD-10-CM | POA: Diagnosis not present

## 2020-10-22 DIAGNOSIS — Z9889 Other specified postprocedural states: Secondary | ICD-10-CM | POA: Diagnosis not present

## 2020-10-22 DIAGNOSIS — K219 Gastro-esophageal reflux disease without esophagitis: Secondary | ICD-10-CM | POA: Diagnosis not present

## 2020-10-22 DIAGNOSIS — R609 Edema, unspecified: Secondary | ICD-10-CM

## 2020-10-22 DIAGNOSIS — K567 Ileus, unspecified: Secondary | ICD-10-CM | POA: Diagnosis not present

## 2020-10-22 DIAGNOSIS — K6289 Other specified diseases of anus and rectum: Secondary | ICD-10-CM | POA: Diagnosis not present

## 2020-10-22 DIAGNOSIS — Z7982 Long term (current) use of aspirin: Secondary | ICD-10-CM | POA: Diagnosis not present

## 2020-10-22 DIAGNOSIS — K6389 Other specified diseases of intestine: Secondary | ICD-10-CM | POA: Diagnosis not present

## 2020-10-22 DIAGNOSIS — M79604 Pain in right leg: Secondary | ICD-10-CM | POA: Diagnosis not present

## 2020-10-22 LAB — COMPREHENSIVE METABOLIC PANEL
ALT: 36 U/L (ref 0–44)
AST: 34 U/L (ref 15–41)
Albumin: 4.1 g/dL (ref 3.5–5.0)
Alkaline Phosphatase: 46 U/L (ref 38–126)
Anion gap: 10 (ref 5–15)
BUN: 27 mg/dL — ABNORMAL HIGH (ref 6–20)
CO2: 23 mmol/L (ref 22–32)
Calcium: 9.1 mg/dL (ref 8.9–10.3)
Chloride: 91 mmol/L — ABNORMAL LOW (ref 98–111)
Creatinine, Ser: 2.62 mg/dL — ABNORMAL HIGH (ref 0.61–1.24)
GFR, Estimated: 28 mL/min — ABNORMAL LOW (ref >60)
Glucose, Bld: 121 mg/dL — ABNORMAL HIGH (ref 70–99)
Potassium: 3.9 mmol/L (ref 3.5–5.1)
Sodium: 124 mmol/L — ABNORMAL LOW (ref 135–145)
Total Bilirubin: 1.3 mg/dL — ABNORMAL HIGH (ref 0.3–1.2)
Total Protein: 6.7 g/dL (ref 6.5–8.1)

## 2020-10-22 LAB — CBC WITH DIFFERENTIAL/PLATELET
Abs Immature Granulocytes: 0.02 10*3/uL (ref 0.00–0.07)
Abs Immature Granulocytes: 0.02 10*3/uL (ref 0.00–0.07)
Basophils Absolute: 0 10*3/uL (ref 0.0–0.1)
Basophils Absolute: 0 10*3/uL (ref 0.0–0.1)
Basophils Relative: 0 %
Basophils Relative: 0 %
Eosinophils Absolute: 0.1 10*3/uL (ref 0.0–0.5)
Eosinophils Absolute: 0.2 10*3/uL (ref 0.0–0.5)
Eosinophils Relative: 1 %
Eosinophils Relative: 3 %
HCT: 37.3 % — ABNORMAL LOW (ref 39.0–52.0)
HCT: 37.2 % — ABNORMAL LOW (ref 39.0–52.0)
Hemoglobin: 13.6 g/dL (ref 13.0–17.0)
Hemoglobin: 13.8 g/dL (ref 13.0–17.0)
Immature Granulocytes: 0 %
Immature Granulocytes: 0 %
Lymphocytes Relative: 9 %
Lymphocytes Relative: 14 %
Lymphs Abs: 0.9 10*3/uL (ref 0.7–4.0)
Lymphs Abs: 1.2 10*3/uL (ref 0.7–4.0)
MCH: 30.8 pg (ref 26.0–34.0)
MCH: 31.1 pg (ref 26.0–34.0)
MCHC: 36.5 g/dL — ABNORMAL HIGH (ref 30.0–36.0)
MCHC: 37.1 g/dL — ABNORMAL HIGH (ref 30.0–36.0)
MCV: 84.6 fL (ref 80.0–100.0)
MCV: 83.8 fL (ref 80.0–100.0)
Monocytes Absolute: 0.8 10*3/uL (ref 0.1–1.0)
Monocytes Absolute: 0.9 10*3/uL (ref 0.1–1.0)
Monocytes Relative: 9 %
Monocytes Relative: 11 %
Neutro Abs: 7.4 10*3/uL (ref 1.7–7.7)
Neutro Abs: 6.2 10*3/uL (ref 1.7–7.7)
Neutrophils Relative %: 81 %
Neutrophils Relative %: 72 %
Platelets: 201 10*3/uL (ref 150–400)
Platelets: 206 10*3/uL (ref 150–400)
RBC: 4.41 MIL/uL (ref 4.22–5.81)
RBC: 4.44 MIL/uL (ref 4.22–5.81)
RDW: 11.9 % (ref 11.5–15.5)
RDW: 11.9 % (ref 11.5–15.5)
WBC: 9.2 10*3/uL (ref 4.0–10.5)
WBC: 8.5 10*3/uL (ref 4.0–10.5)
nRBC: 0 % (ref 0.0–0.2)
nRBC: 0 % (ref 0.0–0.2)

## 2020-10-22 LAB — BLADDER SCAN AMB NON-IMAGING: Scan Result: >999

## 2020-10-22 LAB — APTT: aPTT: 31 seconds (ref 24–36)

## 2020-10-22 MED ORDER — ROSUVASTATIN CALCIUM 10 MG PO TABS
20.0000 mg | ORAL_TABLET | Freq: Every day | ORAL | Status: DC
  Administered 2020-10-22 – 2020-10-23 (×2): 20 mg via ORAL
  Filled 2020-10-22 (×2): qty 2

## 2020-10-22 MED ORDER — AMLODIPINE BESYLATE 10 MG PO TABS
10.0000 mg | ORAL_TABLET | Freq: Every day | ORAL | Status: DC
  Administered 2020-10-23 – 2020-10-24 (×2): 10 mg via ORAL
  Filled 2020-10-22 (×3): qty 1

## 2020-10-22 MED ORDER — HEPARIN SODIUM (PORCINE) 5000 UNIT/ML IJ SOLN
5000.0000 [IU] | Freq: Three times a day (TID) | INTRAMUSCULAR | Status: DC
  Administered 2020-10-22 – 2020-10-24 (×5): 5000 [IU] via SUBCUTANEOUS
  Filled 2020-10-22 (×5): qty 1

## 2020-10-22 MED ORDER — FINASTERIDE 5 MG PO TABS
2.5000 mg | ORAL_TABLET | ORAL | Status: DC
  Administered 2020-10-23 – 2020-10-24 (×2): 2.5 mg via ORAL
  Filled 2020-10-22 (×2): qty 0.5
  Filled 2020-10-22 (×2): qty 1

## 2020-10-22 MED ORDER — SODIUM CHLORIDE 0.9 % IV SOLN: INTRAVENOUS | Status: DC

## 2020-10-22 MED ORDER — ASPIRIN EC 81 MG PO TBEC: 81.0000 mg | DELAYED_RELEASE_TABLET | Freq: Every day | ORAL | Status: DC

## 2020-10-22 MED ORDER — OXYCODONE HCL 5 MG PO TABS: 5.0000 mg | ORAL_TABLET | ORAL | Status: DC | PRN

## 2020-10-22 MED ORDER — ACETAMINOPHEN 500 MG PO TABS
1000.0000 mg | ORAL_TABLET | Freq: Four times a day (QID) | ORAL | Status: DC | PRN
  Administered 2020-10-22 – 2020-10-23 (×3): 1000 mg via ORAL
  Filled 2020-10-22 (×3): qty 2

## 2020-10-22 MED ORDER — ONDANSETRON HCL 4 MG PO TABS: 4.0000 mg | ORAL_TABLET | Freq: Three times a day (TID) | ORAL | 0 refills | Status: DC | PRN

## 2020-10-22 MED ORDER — SODIUM CHLORIDE 0.9 % IV SOLN: INTRAVENOUS | Status: AC

## 2020-10-22 MED ORDER — TAMSULOSIN HCL 0.4 MG PO CAPS
0.4000 mg | ORAL_CAPSULE | Freq: Every day | ORAL | 0 refills | Status: DC
Start: 2020-10-22 — End: 2020-11-01

## 2020-10-22 MED ORDER — IOHEXOL 9 MG/ML PO SOLN
500.0000 mL | ORAL | Status: AC
  Administered 2020-10-22 (×2): 500 mL via ORAL

## 2020-10-22 MED ORDER — TAMSULOSIN HCL 0.4 MG PO CAPS
0.4000 mg | ORAL_CAPSULE | Freq: Every day | ORAL | Status: DC
  Administered 2020-10-22 – 2020-10-24 (×3): 0.4 mg via ORAL
  Filled 2020-10-22 (×3): qty 1

## 2020-10-22 MED ORDER — ALBUTEROL SULFATE (2.5 MG/3ML) 0.083% IN NEBU: 3.0000 mL | INHALATION_SOLUTION | Freq: Four times a day (QID) | RESPIRATORY_TRACT | Status: DC | PRN

## 2020-10-22 NOTE — Consult Note (Signed)
[facility] SURGICAL CONSULTATION NOTE (initial) - cpt: [phone]   HISTORY OF PRESENT ILLNESS (HPI):  55 y.o. male known to our service following an umbilical hernia repair on 06/02 with Dr Hampton Abbot. He presented to the clinic this morning for follow up given concerns over significant issues with constipation. He reports that he has struggled having a bowel movement at home despite numerous attempts with laxatives. He has taken only "a few" of his narcotic pain medication. Additionally, reports significant urinary retention. No fever, chills, nausea, emesis. He was referred urgently to urology this afternoon and seen by Dr Kelton Pillar. Catheterization revealed 1900 ccs of urine in his bladder. Indwelling foley was placed. He was also sent urgently for labs. He was without leukocytosis; however, he was found to be hyponatremic to 124 and with AKI with sCr - 2.62 (1.02 previously). Given this the hospitalist was called for admission. CT Abdomen/Pelvis is pending currently.   Since admission, he reports that he has started to feel better. Abdominal pain and discomfort improved. No fever, chills, nausea, emesis. He has since started passing flatus.    PAST MEDICAL HISTORY (PMH):  Past Medical History:  Diagnosis Date  . Family history of ischemic heart disease   . Febrile seizure ([place])    AS A CHILD  . GERD (gastroesophageal reflux disease)    RARE- NO MEDS  . History of hiatal hernia    SMALL  . Hypertension   . Obesity   . Psoriasis   . Seasonal allergies   . Venous insufficiency      PAST SURGICAL HISTORY [facility]):  Past Surgical History:  Procedure Laterality Date  . CYSTECTOMY     left chest  . superficial vein removed Bilateral    LEGS  . TOE SURGERY     right great toe  . UMBILICAL HERNIA REPAIR N/A 10/16/2020   Procedure: HERNIA REPAIR UMBILICAL ADULT;  Surgeon: Olean Ree, MD;  Location: [facility] ORS;  Service: General;  Laterality: N/A;     MEDICATIONS:  Prior to  Admission medications   Medication Sig Start Date End Date Taking? Authorizing Provider  acetaminophen (TYLENOL) 500 MG tablet Take 2 tablets (1,000 mg total) by mouth every 6 (six) hours as needed for mild pain. 10/16/20  Yes Piscoya, Jacqulyn Bath, MD  amLODipine (NORVASC) 10 MG tablet Take 1 tablet (10 mg total) by mouth daily. Patient taking differently: Take 10 mg by mouth every morning. 06/19/20  Yes Denita Lung, MD  aspirin 81 MG tablet Take 81 mg by mouth daily.   Yes [provider]  finasteride (PROSCAR) 5 MG tablet TAKE 1 TABLET BY MOUTH EVERY DAY Patient taking differently: Take 2.5 mg by mouth every morning. 06/16/20  Yes Denita Lung, MD  ibuprofen (ADVIL) 600 MG tablet Take 1 tablet (600 mg total) by mouth every 8 (eight) hours as needed for moderate pain. 10/16/20  Yes Piscoya, Jacqulyn Bath, MD  loratadine (CLARITIN) 10 MG tablet Take 10 mg by mouth every morning.   Yes [provider]  Multiple Vitamin (MULTIVITAMIN WITH MINERALS) TABS Take 1 tablet by mouth daily.   Yes [provider]  Omega-3 Fatty Acids (FISH OIL) 1200 MG CAPS Take 2,400 mg by mouth daily.   Yes [provider]  oxyCODONE (OXY IR/ROXICODONE) 5 MG immediate release tablet Take 1 tablet (5 mg total) by mouth every 4 (four) hours as needed for severe pain. 10/16/20  Yes Piscoya, Jacqulyn Bath, MD  rosuvastatin (CRESTOR) 20 MG tablet Take 1 tablet (20  mg total) by mouth daily. Patient taking differently: Take 20 mg by mouth at bedtime. 06/19/20  Yes Denita Lung, MD  tamsulosin (FLOMAX) 0.4 MG CAPS capsule Take 1 capsule (0.4 mg total) by mouth daily. 10/22/20  Yes Hollice Espy, MD  ondansetron (ZOFRAN) 4 MG tablet Take 1 tablet (4 mg total) by mouth every 8 (eight) hours as needed for nausea or vomiting. 10/22/20   Pabon, Diego F, MD  PROAIR RESPICLICK 914 (90 Base) MCG/ACT AEPB INHALE 2 PUFFS INTO THE LUNGS 4 (FOUR) TIMES DAILY AS NEEDED. Patient taking differently: Inhale 2 puffs into the lungs every 6  (six) hours as needed (Shortness of breath). SEASONAL ALLERGIES 02/13/18   Denita Lung, MD     ALLERGIES:  No Known Allergies   SOCIAL HISTORY:  Social History   Socioeconomic History  . Marital status: Married    Spouse name: Not on file  . Number of children: Not on file  . Years of education: Not on file  . Highest education level: Not on file  Occupational History  . Not on file  Tobacco Use  . Smoking status: Never Smoker  . Smokeless tobacco: Never Used  Vaping Use  . Vaping Use: Never used  Substance and Sexual Activity  . Alcohol use: Yes    Alcohol/week: 3.0 standard drinks    Types: 3 Standard drinks or equivalent per week    Comment: OCC  . Drug use: No  . Sexual activity: Yes  Other Topics Concern  . Not on file  Social History Narrative  . Not on file   Social Determinants of Health   Financial Resource Strain: Not on file  Food Insecurity: Not on file  Transportation Needs: Not on file  Physical Activity: Not on file  Stress: Not on file  Social Connections: Not on file  Intimate Partner Violence: Not on file     FAMILY HISTORY:  Family History  Problem Relation Age of Onset  . Heart disease Father   . Hypertension Father   . Heart failure Father   . Heart attack Father 69       both age 41 & 14  . Colon cancer Neg Hx       REVIEW OF SYSTEMS:  Review of Systems  Constitutional: Negative for chills and fever.  HENT: Negative for congestion and sore throat.   Respiratory: Negative for cough and shortness of breath.   Cardiovascular: Negative for chest pain and palpitations.  Gastrointestinal: Positive for constipation. Negative for abdominal pain, nausea and vomiting.  Genitourinary: Positive for dysuria (retention). Negative for urgency.  All other systems reviewed and are negative.   VITAL SIGNS:  Temp:  [98.4 F (36.9 C)-98.7 F (37.1 C)] 98.7 F (37.1 C) (06/08 1504) Pulse Rate:  [69-77] 69 (06/08 1504) Resp:  [18] 18 (06/08  1504) BP: (131-171)/(73-89) 131/73 (06/08 1504) SpO2:  [97 %-100 %] 100 % (06/08 1504) Weight:  [86.2 kg-93.4 kg] 86.2 kg (06/08 1032)             INTAKE/OUTPUT:  No intake/output data recorded.  PHYSICAL EXAM:  Physical Exam Vitals and nursing note reviewed. Exam conducted with a chaperone present.  Constitutional:      General: He is not in acute distress.    Appearance: Normal appearance. He is normal weight. He is not ill-appearing.  HENT:     Head: Normocephalic and atraumatic.  Eyes:     General: No scleral icterus.    Conjunctiva/sclera: Conjunctivae normal.  Cardiovascular:     Rate and Rhythm: Normal rate.     Pulses: Normal pulses.  Pulmonary:     Effort: Pulmonary effort is normal. No respiratory distress.  Abdominal:     General: A surgical scar is present. There is no distension.     Palpations: Abdomen is soft.     Tenderness: There is abdominal tenderness (Mild, incisional ) in the periumbilical area. There is no guarding or rebound.     Comments: Abdomen is soft, incisional tenderness at umbilicus, no rebound/guarding. Umbilical incision is CDI with dermabond, no erythema, expected induration, early ecchymosis  Genitourinary:    Comments: Foley in place  Musculoskeletal:     Right lower leg: No edema.     Left lower leg: No edema.  Skin:    General: Skin is warm and dry.     Coloration: Skin is not pale.     Findings: No erythema.  Neurological:     General: No focal deficit present.     Mental Status: He is alert and oriented to person, place, and time.  Psychiatric:        Mood and Affect: Mood normal.        Behavior: Behavior normal.      Labs:  CBC Latest Ref Rng & Units 10/22/2020 10/22/2020 06/19/2020  WBC 4.0 - 10.5 K/uL 8.5 9.2 9.3  Hemoglobin 13.0 - 17.0 g/dL 13.8 13.6 15.8  Hematocrit 39.0 - 52.0 % 37.2(L) 37.3(L) 45.5  Platelets 150 - 400 K/uL 206 201 252   CMP Latest Ref Rng & Units 10/22/2020 06/19/2020 06/14/2018  Glucose 70 - 99 mg/dL  121(H) 102(H) 108(H)  BUN 6 - 20 mg/dL 27(H) 9 12  Creatinine 0.61 - 1.24 mg/dL 2.62(H) 1.02 1.22  Sodium 135 - 145 mmol/L 124(L) 141 139  Potassium 3.5 - 5.1 mmol/L 3.9 4.5 3.8  Chloride 98 - 111 mmol/L 91(L) 101 100  CO2 22 - 32 mmol/L 23 25 23   Calcium 8.9 - 10.3 mg/dL 9.1 9.9 9.4  Total Protein 6.5 - 8.1 g/dL 6.7 7.4 6.8  Total Bilirubin 0.3 - 1.2 mg/dL 1.3(H) 0.7 0.8  Alkaline Phos 38 - 126 U/L 46 58 67  AST 15 - 41 U/L 34 21 18  ALT 0 - 44 U/L 36 27 28     Imaging studies:  CT Abdomen/Pelvis pending    Assessment/Plan: (ICD-10's: N17.9) 55 y.o. male with post operative ileus/constipation as well as urinary retention resulting in AKI 6 days s/p umbilical hernia repair   - Appreciate medicine admission  - Okay for full liquid diet  - Follow up CT Abdomen/Pelvis  - Continue foley catheter x7 days; tamsulosin 0.4 mg daily; appreciate urology assistance  - Monitor abdominal examination  - Consider bowel regimen  - Pain control prn (minimize narcotics as feasible); antiemetics prn  - Mobilization encouraged    - further management per primary service; we will of course follow  All of the above findings and recommendations were discussed with the patient and his family (wife at bedside), and all of patient's and his family's questions were answered to their expressed satisfaction.  Thank you for the opportunity to participate in this patient's care.   -- Edison Simon, PA-C [facility] 10/22/2020, 5:28 PM [phone] M-F: 7am - 4pm

## 2020-10-22 NOTE — Progress Notes (Signed)
Outpatient Surgical Follow Up  10/22/2020  Randy [name] is an 55 y.o. male.   Chief Complaint  Patient presents with  . Routine Post Op    HPI: Randy [name] is a 55 year old male status post umbilical hernia repair by Dr. Hampton Abbot 6/2.  He has had significant issues with constipation and has developed urinary retention. He has tried multiple laxative without any success.  He has only taken a few oxycodones.  I have reviewed the operative report and seems to be an uneventful surgery. He also some rectal discomfort.  He is very frustrated.  He also reports leg edema     Past Medical History:  Diagnosis Date  . Family history of ischemic heart disease   . Febrile seizure ([place])    AS A CHILD  . GERD (gastroesophageal reflux disease)    RARE- NO MEDS  . History of hiatal hernia    SMALL  . Hypertension   . Obesity   . Psoriasis   . Seasonal allergies   . Venous insufficiency     Past Surgical History:  Procedure Laterality Date  . CYSTECTOMY     left chest  . superficial vein removed Bilateral    LEGS  . TOE SURGERY     right great toe  . UMBILICAL HERNIA REPAIR N/A 10/16/2020   Procedure: HERNIA REPAIR UMBILICAL ADULT;  Surgeon: Olean Ree, MD;  Location: [facility] ORS;  Service: General;  Laterality: N/A;    Family History  Problem Relation Age of Onset  . Heart disease Father   . Hypertension Father   . Heart failure Father   . Heart attack Father 51       both age 29 & 26  . Colon cancer Neg Hx     Social History:  reports that he has never smoked. He has never used smokeless tobacco. He reports current alcohol use of about 3.0 standard drinks of alcohol per week. He reports that he does not use drugs.  Allergies: No Known Allergies  Medications reviewed.    ROS Full ROS performed and is otherwise negative other than what is stated in HPI   BP (!) 171/89   Pulse 77   Temp 98.4 F (36.9 C)   Ht 5\' 10"  (1.778 m)   Wt 206 lb (93.4 kg)   SpO2 97%   BMI  29.56 kg/m   Physical Exam He is anxious and frustrated Abd: soft, incision healing well, no infection, obvious large palpable urinary bladder, suprapubic tenderness. No peritonitis Recta; internal hemorrhoids, he did have a stool ball around perianal area, I had to do manual disimpaction since he had hard stool on his rectal Vault. No evidence of acute perianal problems Ext: edema bilaterally, well perfused    No results found for this or any previous visit (from the past 48 hour(s)). CT CARDIAC SCORING (SELF PAY ONLY)  Addendum Date: 10/21/2020   ADDENDUM REPORT: 10/21/2020 10:55 CLINICAL DATA:  Cardiovascular Disease Risk stratification EXAM: Coronary Calcium Score TECHNIQUE: A gated, non-contrast computed tomography scan of the heart was performed using 72mm slice thickness. Axial images were analyzed on a dedicated workstation. Calcium scoring of the coronary arteries was performed using the Agatston method. FINDINGS: Coronary arteries: Normal origins. Coronary Calcium Score: Left main: 0 Left anterior descending artery: 5.45 Left circumflex artery: 4.1 Right coronary artery: 0 Total: 9.55 Percentile: 53rd Pericardium: Normal. Ascending Aorta: Normal caliber. Non-cardiac: See separate report from [facility]. IMPRESSION: Coronary calcium score of 9.55. This was 53rd percentile  for age-, race-, and sex-matched controls. RECOMMENDATIONS: Coronary artery calcium (CAC) score is a strong predictor of incident coronary heart disease (CHD) and provides predictive information beyond traditional risk factors. CAC scoring is reasonable to use in the decision to withhold, postpone, or initiate statin therapy in intermediate-risk or selected borderline-risk asymptomatic adults (age 24-75 years and LDL-C >=70 to <190 mg/dL) who do not have diabetes or established atherosclerotic cardiovascular disease (ASCVD).* In intermediate-risk (10-year ASCVD risk >=7.5% to <20%) adults or selected borderline-risk  (10-year ASCVD risk >=5% to <7.5%) adults in whom a CAC score is measured for the purpose of making a treatment decision the following recommendations have been made: If CAC=0, it is reasonable to withhold statin therapy and reassess in 5 to 10 years, as long as higher risk conditions are absent (diabetes mellitus, family history of premature CHD in first degree relatives (males <55 years; females <65 years), cigarette smoking, or LDL >=190 mg/dL). If CAC is 1 to 99, it is reasonable to initiate statin therapy for patients >=19 years of age. If CAC is >=100 or >=75th percentile, it is reasonable to initiate statin therapy at any age. Cardiology referral should be considered for patients with CAC scores >=400 or >=75th percentile. *2018 AHA/ACC/AACVPR/AAPA/ABC/ACPM/ADA/AGS/APhA/ASPC/NLA/PCNA Guideline on the Management of Blood Cholesterol: A Report of the American College of Cardiology/American Heart Association Task Force on Clinical Practice Guidelines. J Am Coll Cardiol. 2019;73(24):3168-3209. Fransico Him, MD Electronically Signed   By: Fransico Him   On: 10/21/2020 10:55   Result Date: 10/21/2020 EXAM: OVER-READ INTERPRETATION  CT CHEST The following report is an over-read performed by radiologist Dr. Vinnie Langton of [facility], [place] on 10/21/2020. This over-read does not include interpretation of cardiac or coronary anatomy or pathology. The coronary calcium score interpretation by the cardiologist is attached. COMPARISON:  None. FINDINGS: Within the visualized portions of the thorax there are no suspicious appearing pulmonary nodules or masses, there is no acute consolidative airspace disease, no pleural effusions, no pneumothorax and no lymphadenopathy. Visualized portions of the upper abdomen are unremarkable. There are no aggressive appearing lytic or blastic lesions noted in the visualized portions of the skeleton. IMPRESSION: 1. No significant incidental noncardiac findings are noted.  Electronically Signed: By: Vinnie Langton M.D. On: 10/21/2020 08:11    Assessment/Plan:  55 year old status post hernia repair now with urinary retention and constipation as well as dehydration.  This is all likely related to narcotics.  I do not have a catheter to decompress his bladder.  We will place an urgent consult for urology to do an In-N-Out catheter.  I will also perform a stat CMP and CBC to evaluate for his renal function and other electrolyte abnormalities.  Also will like to obtain a CT scan of the abdomen and pelvis with contrast out of abundance of caution to make sure we are not missing any potential complications related to his hernia repair.  From a hernia perspective I do not think that there is a complication that will mandate immediate surgical intervention  Addendum I reviewed the lab results consistent with severe hyponatremia and acute kidney injury likely related from obstructive process.  I discussed the case with Dr. Erlene Quan and we both agree that he needs to be admitted for treatment of the acute kidney injury as well as correction of hyponatremia.  We will be able to evaluate further with a CT scan of the abdomen pelvis once she is admitted. May need miralax but not inclined to be too aggressive. Will  need Parenteral hydration as well  Caroleen Hamman, MD [place] [facility]

## 2020-10-22 NOTE — Progress Notes (Signed)
Simple Catheter Placement  Due to urinary retention patient is present today for a foley cath placement.  Patient was cleaned and prepped in a sterile fashion with betadine. A 18 coude FR foley catheter was inserted, urine return was noted  2039ml, urine was amber in color.  The balloon was filled with 10cc of sterile water.  A leg bag was attached for drainage. Patient was also given a night bag to take home and was given instruction on how to change from one bag to another.  Patient was given instruction on proper catheter care.  Patient tolerated well, no complications noted   Performed by: Fonnie Jarvis, CMA  Additional notes/ Follow up: patient sent to [facility] for stat labs per Dr. Erlene Quan will return to the office this afternoon to review and establish plan of care

## 2020-10-22 NOTE — Patient Instructions (Addendum)
You may use 2 extra strength tylenol(1000 mg) every 6 hours continuously.  Use a ice pack to the stomach 3-4 times a day.   Get a bottle of Magnesium Citrate and drink this all once you get home. This should clear out your bowels.   Go to the [facility] and report to [facility], the first office on the left when you walk in.   You should get lab work done today after seeing Urology at [facility], [facility] entrance.    We will get you scheduled for a CT scan tomorrow. We will call you with this information.  If you are able to evacuate your bowels fully you may call and cancel the scan. [phone]. Also let our office know if you cancel this.

## 2020-10-22 NOTE — Plan of Care (Signed)
Reviewed with patient

## 2020-10-22 NOTE — H&P (Signed)
History and Physical    Randy [name]:[id number] DOB: [birth year] DOA: 10/22/2020  PCP: Denita Lung, MD    Patient coming from:  Urology office   Chief Complaint:  Abdominal distention pain and urinary retention.   HPI: Randy [name] is a 55 y.o. male seen as a direct admit for complaints of abdominal distention, pain and urinary retention with difficulty voiding urine.  Patient also reports constipation and has tried multiple laxatives without success.  Constipation is secondary to the pain medications for his postop recovery from surgery.  Patient reported bilateral leg swelling to his general surgery appointment to physician today.  Patient is status post hernia repair now with urinary retention and constipation and dehydration all attributed to his narcotic use for pain management post procedure patient was sent to urology.  In urology officean 18 French coud catheter was placed by our CMA and 1900 cc of clear yellow urine was promptly drained.pt had labs that showed AKI and hyponatremia.   Pt has past medical history of GERD, hiatal hernia, hypertension, psoriasis, seasonal allergies. ED Course:  Vitals:   10/22/20 1504  BP: 131/73  Pulse: 69  Resp: 18  Temp: 98.7 F (37.1 C)  SpO2: 100%  No ed course, pt arrived to 229 and orders placed.    Review of Systems:  Review of Systems  Cardiovascular: Positive for leg swelling.  Gastrointestinal: Positive for abdominal pain and constipation.  Genitourinary: Positive for frequency and urgency.  All other systems reviewed and are negative.    Past Medical History:  Diagnosis Date  . Family history of ischemic heart disease   . Febrile seizure ([place])    AS A CHILD  . GERD (gastroesophageal reflux disease)    RARE- NO MEDS  . History of hiatal hernia    SMALL  . Hypertension   . Obesity   . Psoriasis   . Seasonal allergies   . Venous insufficiency     Past Surgical History:  Procedure Laterality Date  .  CYSTECTOMY     left chest  . superficial vein removed Bilateral    LEGS  . TOE SURGERY     right great toe  . UMBILICAL HERNIA REPAIR N/A 10/16/2020   Procedure: HERNIA REPAIR UMBILICAL ADULT;  Surgeon: Olean Ree, MD;  Location: [facility] ORS;  Service: General;  Laterality: N/A;     reports that he has never smoked. He has never used smokeless tobacco. He reports current alcohol use of about 3.0 standard drinks of alcohol per week. He reports that he does not use drugs.  No Known Allergies  Family History  Problem Relation Age of Onset  . Heart disease Father   . Hypertension Father   . Heart failure Father   . Heart attack Father 76       both age 2 & 6  . Colon cancer Neg Hx     Prior to Admission medications   Medication Sig Start Date End Date Taking? Authorizing Provider  acetaminophen (TYLENOL) 500 MG tablet Take 2 tablets (1,000 mg total) by mouth every 6 (six) hours as needed for mild pain. 10/16/20   Olean Ree, MD  amLODipine (NORVASC) 10 MG tablet Take 1 tablet (10 mg total) by mouth daily. Patient taking differently: Take 10 mg by mouth every morning. 06/19/20   Denita Lung, MD  aspirin 81 MG tablet Take 81 mg by mouth daily.    [provider]  finasteride (PROSCAR) 5 MG tablet TAKE 1  TABLET BY MOUTH EVERY DAY Patient taking differently: Take 2.5 mg by mouth every morning. 06/16/20   Denita Lung, MD  ibuprofen (ADVIL) 600 MG tablet Take 1 tablet (600 mg total) by mouth every 8 (eight) hours as needed for moderate pain. 10/16/20   Olean Ree, MD  loratadine (CLARITIN) 10 MG tablet Take 10 mg by mouth every morning.    [provider]  Multiple Vitamin (MULTIVITAMIN WITH MINERALS) TABS Take 1 tablet by mouth daily.    [provider]  Omega-3 Fatty Acids (FISH OIL) 1200 MG CAPS Take 2,400 mg by mouth daily.    [provider]  ondansetron (ZOFRAN) 4 MG tablet Take 1 tablet (4 mg total) by mouth every 8 (eight) hours as needed  for nausea or vomiting. 10/22/20   Pabon, Diego F, MD  oxyCODONE (OXY IR/ROXICODONE) 5 MG immediate release tablet Take 1 tablet (5 mg total) by mouth every 4 (four) hours as needed for severe pain. 10/16/20   Olean Ree, MD  PROAIR RESPICLICK 665 (90 Base) MCG/ACT AEPB INHALE 2 PUFFS INTO THE LUNGS 4 (FOUR) TIMES DAILY AS NEEDED. Patient taking differently: Inhale 2 puffs into the lungs every 6 (six) hours as needed (Shortness of breath). SEASONAL ALLERGIES 02/13/18   Denita Lung, MD  rosuvastatin (CRESTOR) 20 MG tablet Take 1 tablet (20 mg total) by mouth daily. Patient taking differently: Take 20 mg by mouth at bedtime. 06/19/20   Denita Lung, MD  tamsulosin (FLOMAX) 0.4 MG CAPS capsule Take 1 capsule (0.4 mg total) by mouth daily. 10/22/20   Hollice Espy, MD    Physical Exam: Vitals:   10/22/20 1504  BP: 131/73  Pulse: 69  Resp: 18  Temp: 98.7 F (37.1 C)  SpO2: 100%   Physical Exam Vitals and nursing note reviewed.  Constitutional:      General: He is not in acute distress.    Appearance: He is not ill-appearing, toxic-appearing or diaphoretic.  HENT:     Head: Normocephalic and atraumatic.     Right Ear: External ear normal.     Left Ear: External ear normal.     Nose: Nose normal.     Mouth/Throat:     Mouth: Mucous membranes are moist.  Eyes:     Extraocular Movements: Extraocular movements intact.     Pupils: Pupils are equal, round, and reactive to light.  Cardiovascular:     Rate and Rhythm: Normal rate and regular rhythm.     Pulses: Normal pulses.     Heart sounds: Normal heart sounds.  Pulmonary:     Effort: Pulmonary effort is normal.     Breath sounds: Normal breath sounds.  Abdominal:     General: Bowel sounds are normal. There is no distension.     Palpations: Abdomen is soft. There is no mass.     Tenderness: There is abdominal tenderness. There is no guarding.     Hernia: No hernia is present.  Musculoskeletal:     Right lower leg: Edema  present.     Left lower leg: Edema present.  Skin:    General: Skin is warm.  Neurological:     General: No focal deficit present.     Mental Status: He is alert and oriented to person, place, and time.  Psychiatric:        Mood and Affect: Mood normal.        Behavior: Behavior normal.      Labs on Admission: I have  personally reviewed following labs and imaging studies  No results for input(s): CKTOTAL, CKMB, TROPONINI in the last 72 hours. Lab Results  Component Value Date   WBC 8.5 10/22/2020   HGB 13.8 10/22/2020   HCT 37.2 (L) 10/22/2020   MCV 83.8 10/22/2020   PLT 206 10/22/2020    Recent Labs  Lab 10/22/20 1209  NA 124*  K 3.9  CL 91*  CO2 23  BUN 27*  CREATININE 2.62*  CALCIUM 9.1  PROT 6.7  BILITOT 1.3*  ALKPHOS 46  ALT 36  AST 34  GLUCOSE 121*   Lab Results  Component Value Date   CHOL 137 06/19/2020   HDL 66 06/19/2020   LDLCALC 56 06/19/2020   TRIG 77 06/19/2020   Lab Results  Component Value Date   DDIMER 0.27 03/01/2012   Invalid input(s): POCBNP  Urinalysis    Component Value Date/Time   LABSPEC 1.010 06/18/2019 0939   BILIRUBINUR negative 06/18/2019 0939   BILIRUBINUR n 06/10/2016 0941   KETONESUR negative 06/18/2019 0939   PROTEINUR negative 06/18/2019 0939   PROTEINUR n 06/10/2016 0941   UROBILINOGEN negative 06/10/2016 0941   NITRITE Negative 06/18/2019 0939   NITRITE n 06/10/2016 0941   LEUKOCYTESUR Negative 06/18/2019 0939   COVID-19 Labs  No results for input(s): DDIMER, FERRITIN, LDH, CRP in the last 72 hours.  Lab Results  Component Value Date   [place] Not Detected 06/18/2020   SARSCOV2NAA Not Detected 05/19/2020   [place] Not Detected 05/14/2019    Radiological Exams on Admission: CT CARDIAC SCORING (SELF PAY ONLY)  Addendum Date: 10/21/2020   ADDENDUM REPORT: 10/21/2020 10:55 CLINICAL DATA:  Cardiovascular Disease Risk stratification EXAM: Coronary Calcium Score TECHNIQUE: A gated, non-contrast  computed tomography scan of the heart was performed using 83mm slice thickness. Axial images were analyzed on a dedicated workstation. Calcium scoring of the coronary arteries was performed using the Agatston method. FINDINGS: Coronary arteries: Normal origins. Coronary Calcium Score: Left main: 0 Left anterior descending artery: 5.45 Left circumflex artery: 4.1 Right coronary artery: 0 Total: 9.55 Percentile: 53rd Pericardium: Normal. Ascending Aorta: Normal caliber. Non-cardiac: See separate report from [facility]. IMPRESSION: Coronary calcium score of 9.55. This was 53rd percentile for age-, race-, and sex-matched controls. RECOMMENDATIONS: Coronary artery calcium (CAC) score is a strong predictor of incident coronary heart disease (CHD) and provides predictive information beyond traditional risk factors. CAC scoring is reasonable to use in the decision to withhold, postpone, or initiate statin therapy in intermediate-risk or selected borderline-risk asymptomatic adults (age 21-75 years and LDL-C >=70 to <190 mg/dL) who do not have diabetes or established atherosclerotic cardiovascular disease (ASCVD).* In intermediate-risk (10-year ASCVD risk >=7.5% to <20%) adults or selected borderline-risk (10-year ASCVD risk >=5% to <7.5%) adults in whom a CAC score is measured for the purpose of making a treatment decision the following recommendations have been made: If CAC=0, it is reasonable to withhold statin therapy and reassess in 5 to 10 years, as long as higher risk conditions are absent (diabetes mellitus, family history of premature CHD in first degree relatives (males <55 years; females <65 years), cigarette smoking, or LDL >=190 mg/dL). If CAC is 1 to 99, it is reasonable to initiate statin therapy for patients >=21 years of age. If CAC is >=100 or >=75th percentile, it is reasonable to initiate statin therapy at any age. Cardiology referral should be considered for patients with CAC scores >=400 or  >=75th percentile. *2018 AHA/ACC/AACVPR/AAPA/ABC/ACPM/ADA/AGS/APhA/ASPC/NLA/PCNA Guideline on the Management of Blood Cholesterol: A  Report of the [employer] of Cardiology/American Heart Association Task Force on Clinical Practice Guidelines. J Am Coll Cardiol. 2019;73(24):3168-3209. Fransico Him, MD Electronically Signed   By: Fransico Him   On: 10/21/2020 10:55   Result Date: 10/21/2020 EXAM: OVER-READ INTERPRETATION  CT CHEST The following report is an over-read performed by radiologist Dr. Vinnie Langton of [facility], [place] on 10/21/2020. This over-read does not include interpretation of cardiac or coronary anatomy or pathology. The coronary calcium score interpretation by the cardiologist is attached. COMPARISON:  None. FINDINGS: Within the visualized portions of the thorax there are no suspicious appearing pulmonary nodules or masses, there is no acute consolidative airspace disease, no pleural effusions, no pneumothorax and no lymphadenopathy. Visualized portions of the upper abdomen are unremarkable. There are no aggressive appearing lytic or blastic lesions noted in the visualized portions of the skeleton. IMPRESSION: 1. No significant incidental noncardiac findings are noted. Electronically Signed: By: Vinnie Langton M.D. On: 10/21/2020 08:11    EKG:  None  Assessment/Plan Principal Problem:   Acute urinary retention Active Problems:   AKI (acute kidney injury) ([place])   Essential hypertension   GERD   Umbilical hernia without obstruction and without gangrene   Acute urinary retention: Attribute to patient's pain medication constipation possible BPH, wife reports that patient has intermittent symptoms at home's.  Patient coming to [place] from urology's office after having a coud catheter placed, with recommendations for maintain placement for 1 week.  Flomax per urology.  Acute kidney injury: Lab Results  Component Value Date   CREATININE 2.62 (H) 10/22/2020   CREATININE  1.02 06/19/2020   CREATININE 1.22 06/14/2018  Attribute to dehydration, possible reflux, urinary obstruction. We will continue with IV fluid hydration maintenance.  And avoid nephrotoxic agents and renally dose all needed medications.  We will also discontinue patient's ibuprofen.  Hyponatremia: Attribute to dehydration we will continue with normal saline IV maintenance.  Hypertension: Blood pressure 131/73, pulse 69, temperature 98.7 F (37.1 C), resp. rate 18, SpO2 100 %. We will continue patient on amlodipine.  GERD: PPI therapy.  Umbilical hernia without obstruction and gangrene: Status postsurgical repair last Thursday.   Lower extremity edema bilaterally:  Patient is at moderate to high risk for DVT due to recent surgical procedure and recent trip. Will obtain bilateral lower extremity venous Dopplers. Patient serum albumin is 4.1.    DVT prophylaxis:  Heparin  Code Status:  Full code  Family Communication:  Lapka,Sharon (Spouse)  [phone] (Mobile)   Disposition Plan:  Home  Consults called:  General surgery/urology.  Admission status: Inpatient   Para Skeans MD [facility] [phone] How to contact the [facility] Attending or Consulting provider [place] or covering provider during after hours [place], for this patient.    1. Check the care team in [facility] and look for a) attending/consulting [place] provider listed and b) the [facility] team listed 2. Log into [email or web address] and use [place]'s universal password to access. If you do not have the password, please contact the hospital operator. 3. Locate the [facility] provider you are looking for under [facility] and page to a number that you can be directly reached. 4. If you still have difficulty reaching the provider, please page the [facility] (Director on Call) for the Hospitalists listed on amion for assistance. [email or web address] Password [facility] 10/22/2020, 6:06 PM

## 2020-10-22 NOTE — Progress Notes (Signed)
10/22/2020 1:05 PM   Randy [name] Apr 14, 1966 [id number]  Referring provider: Denita Lung, MD [address] [place],  [place] [address]  Chief Complaint  Patient presents with  . Urinary Retention    HPI: 55 year old male who presents today with acute urinary retention.  He underwent an umbilical hernia pair with mesh by Dr. Hampton Abbot on 10/16/2020.  The procedure was uncomplicated.  Unfortunately however, over the weekend, he continued to have abdominal pain and developed severe constipation.  In addition to this, he started having issues urinating which started on Monday.  His urinary symptoms continue to worsen and abdomen continued to distend.  He was seen earlier this morning in surgical clinic by Dr. Dahlia Byes.  Stat labs were ordered and was referred over to our office for evaluation for urinary retention.  He was also given a bowel prep to help with his constipation.  A noncontrast CT scan was scheduled for tomorrow.  He does have a personal urologist, Dr. Junious Silk to do seen in the past for blood in his sperm.  He takes 1/2 tablet of finasteride for this purpose.  He denies any baseline urinary symptoms other than nocturia x3-4.  In our office, an 44 [place] coud catheter was placed by our CMA and 1900 cc of clear yellow urine was promptly drained.  In light of clinical picture, patient was sent over to the hospital lab and returned back to clinic for stat labs including CBC and CMP.   PMH: Past Medical History:  Diagnosis Date  . Family history of ischemic heart disease   . Febrile seizure ([place])    AS A CHILD  . GERD (gastroesophageal reflux disease)    RARE- NO MEDS  . History of hiatal hernia    SMALL  . Hypertension   . Obesity   . Psoriasis   . Seasonal allergies   . Venous insufficiency     Surgical History: Past Surgical History:  Procedure Laterality Date  . CYSTECTOMY     left chest  . superficial vein removed Bilateral    LEGS  . TOE  SURGERY     right great toe  . UMBILICAL HERNIA REPAIR N/A 10/16/2020   Procedure: HERNIA REPAIR UMBILICAL ADULT;  Surgeon: Olean Ree, MD;  Location: [facility] ORS;  Service: General;  Laterality: N/A;    Home Medications:  Allergies as of 10/22/2020   No Known Allergies     Medication List       Accurate as of October 22, 2020  1:05 PM. If you have any questions, ask your nurse or doctor.        acetaminophen 500 MG tablet Commonly known as: TYLENOL Take 2 tablets (1,000 mg total) by mouth every 6 (six) hours as needed for mild pain.   amLODipine 10 MG tablet Commonly known as: NORVASC Take 1 tablet (10 mg total) by mouth daily. What changed: when to take this   aspirin 81 MG tablet Take 81 mg by mouth daily.   finasteride 5 MG tablet Commonly known as: PROSCAR TAKE 1 TABLET BY MOUTH EVERY DAY What changed:   how much to take  when to take this   Fish Oil 1200 MG Caps Take 2,400 mg by mouth daily.   ibuprofen 600 MG tablet Commonly known as: ADVIL Take 1 tablet (600 mg total) by mouth every 8 (eight) hours as needed for moderate pain.   loratadine 10 MG tablet Commonly known as: CLARITIN Take 10 mg by mouth every morning.  multivitamin with minerals Tabs tablet Take 1 tablet by mouth daily.   ondansetron 4 MG tablet Commonly known as: Zofran Take 1 tablet (4 mg total) by mouth every 8 (eight) hours as needed for nausea or vomiting. Started by: Jules Husbands, MD   oxyCODONE 5 MG immediate release tablet Commonly known as: Oxy IR/ROXICODONE Take 1 tablet (5 mg total) by mouth every 4 (four) hours as needed for severe pain.   ProAir RespiClick 212 (90 Base) MCG/ACT Aepb Generic drug: Albuterol Sulfate INHALE 2 PUFFS INTO THE LUNGS 4 (FOUR) TIMES DAILY AS NEEDED. What changed: See the new instructions.   rosuvastatin 20 MG tablet Commonly known as: Crestor Take 1 tablet (20 mg total) by mouth daily. What changed: when to take this   tamsulosin 0.4 MG Caps  capsule Commonly known as: Flomax Take 1 capsule (0.4 mg total) by mouth daily. Started by: Hollice Espy, MD       Allergies: No Known Allergies  Family History: Family History  Problem Relation Age of Onset  . Heart disease Father   . Hypertension Father   . Heart failure Father   . Heart attack Father 67       both age 91 & 8  . Colon cancer Neg Hx     Social History:  reports that he has never smoked. He has never used smokeless tobacco. He reports current alcohol use of about 3.0 standard drinks of alcohol per week. He reports that he does not use drugs.   Physical Exam: BP (!) 149/84   Pulse 73   Ht 5\' 10"  (1.778 m)   Wt 190 lb (86.2 kg)   BMI 27.26 kg/m   Constitutional:  Alert and oriented, No acute distress.  Accompanied by wife today.  Initially in some distress but relieved after Foley catheter was placed. HEENT: [place] AT, moist mucus membranes.  Trachea midline, no masses. Respiratory: Normal respiratory effort, no increased work of breathing. GI: Abdomen is soft, nontender, nondistended, no abdominal masses GU: Foley catheter in place draining clear yellow urine Extremities: Bilateral pitting edema up to his knees, 2+ Neurologic: Grossly intact, no focal deficits, moving all 4 extremities. Psychiatric: Normal mood and affect.  Laboratory Data: Lab Results  Component Value Date   WBC 9.2 10/22/2020   HGB 13.6 10/22/2020   HCT 37.3 (L) 10/22/2020   MCV 84.6 10/22/2020   PLT 201 10/22/2020    Component     Latest Ref Rng & Units 10/22/2020  Sodium     135 - 145 mmol/L 124 (L)  Potassium     3.5 - 5.1 mmol/L 3.9  Chloride     98 - 111 mmol/L 91 (L)  CO2     22 - 32 mmol/L 23  Glucose     70 - 99 mg/dL 121 (H)  BUN     6 - 20 mg/dL 27 (H)  Creatinine     0.61 - 1.24 mg/dL 2.62 (H)  Calcium     8.9 - 10.3 mg/dL 9.1  Total Protein     6.5 - 8.1 g/dL 6.7  Albumin     3.5 - 5.0 g/dL 4.1  AST     15 - 41 U/L 34  ALT     0 - 44 U/L 36   Alkaline Phosphatase     38 - 126 U/L 46  Total Bilirubin     0.3 - 1.2 mg/dL 1.3 (H)  GFR, Estimated     >60 mL/min  28 (L)  Anion gap     5 - 15 10    Pertinent Imaging: Results for orders placed or performed in visit on 10/22/20  BLADDER SCAN AMB NON-IMAGING  Result Value Ref Range   Scan Result >999 ml     Assessment & Plan:    1. Urinary retention Likely multifactorial including pharmacologic, constipation, and possible underlying BPH  Foley catheter was placed and has had significant ongoing diuresis  High risk for postobstructive diuresis, monitor urine replace electrolytes/fluids as needed  Plan to start him on Flomax  Maintain Foley catheter for at least a week and return for voiding trial-explained that he has a stretch injury of the bladder and also suspects he has bilateral hydroureteronephrosis secondary to massive urinary retention.    - BLADDER SCAN AMB NON-IMAGING  2. Acute renal failure, unspecified acute renal failure type (HCC) Significant electrolyte derangement including renal failure, hyponatremia likely result of both constipation and dietary issues as well as urinary obstruction  Given his constellation of issues, he is most appropriate for inpatient admission.  I did speak with Dr. Dahlia Byes in light of his recent surgery as well as severe constipation, recommended that the patient be admitted to the medical service for management of electrolyte issues and surgery can be consulted to manage his postoperative issues.  Medicine service was paged, discussed case with Dr. Posey Pronto who is agreed to direct admit the patient.  3. Hyponatremia As above    Hollice Espy, MD  [facility] [address], [place], [place] [address]  I spent 75 total minutes on the day of the encounter including pre-visit review of the medical record, face-to-face time with the patient, and post visit ordering of labs/imaging/tests.  [phone]

## 2020-10-22 NOTE — Telephone Encounter (Signed)
Spoke with the patient's wife and let her know that the patient was scheduled for a CT scan at [facility] tomorrow. He will arrive at the [place] entrance by 12:30 pm and have only clear liquids for 4 hours prior. He will need to pick up a prep kit and can do that today while he is at the hospital. He will follow up with Dr Hampton Abbot in 2 weeks.

## 2020-10-23 ENCOUNTER — Ambulatory Visit: Payer: BC Managed Care – PPO

## 2020-10-23 LAB — COMPREHENSIVE METABOLIC PANEL
ALT: 24 U/L (ref 0–44)
AST: 20 U/L (ref 15–41)
Albumin: 3.3 g/dL — ABNORMAL LOW (ref 3.5–5.0)
Alkaline Phosphatase: 36 U/L — ABNORMAL LOW (ref 38–126)
Anion gap: 5 (ref 5–15)
BUN: 15 mg/dL (ref 6–20)
CO2: 25 mmol/L (ref 22–32)
Calcium: 7.7 mg/dL — ABNORMAL LOW (ref 8.9–10.3)
Chloride: 101 mmol/L (ref 98–111)
Creatinine, Ser: 1.14 mg/dL (ref 0.61–1.24)
GFR, Estimated: >60 mL/min (ref >60)
Glucose, Bld: 111 mg/dL — ABNORMAL HIGH (ref 70–99)
Potassium: 3.9 mmol/L (ref 3.5–5.1)
Sodium: 131 mmol/L — ABNORMAL LOW (ref 135–145)
Total Bilirubin: 0.9 mg/dL (ref 0.3–1.2)
Total Protein: 5.4 g/dL — ABNORMAL LOW (ref 6.5–8.1)

## 2020-10-23 LAB — CBC
HCT: 31.8 % — ABNORMAL LOW (ref 39.0–52.0)
Hemoglobin: 11.7 g/dL — ABNORMAL LOW (ref 13.0–17.0)
MCH: 31.5 pg (ref 26.0–34.0)
MCHC: 36.8 g/dL — ABNORMAL HIGH (ref 30.0–36.0)
MCV: 85.7 fL (ref 80.0–100.0)
Platelets: 186 10*3/uL (ref 150–400)
RBC: 3.71 MIL/uL — ABNORMAL LOW (ref 4.22–5.81)
RDW: 11.9 % (ref 11.5–15.5)
WBC: 7.6 10*3/uL (ref 4.0–10.5)
nRBC: 0 % (ref 0.0–0.2)

## 2020-10-23 LAB — HIV ANTIBODY (ROUTINE TESTING W REFLEX): HIV Screen 4th Generation wRfx: NONREACTIVE

## 2020-10-23 MED ORDER — ASPIRIN EC 81 MG PO TBEC
81.0000 mg | DELAYED_RELEASE_TABLET | Freq: Every day | ORAL | Status: DC
  Administered 2020-10-23 – 2020-10-24 (×2): 81 mg via ORAL
  Filled 2020-10-23 (×2): qty 1

## 2020-10-23 MED ORDER — POLYETHYLENE GLYCOL 3350 17 G PO PACK
17.0000 g | PACK | Freq: Two times a day (BID) | ORAL | Status: DC
  Administered 2020-10-23: 17 g via ORAL
  Filled 2020-10-23 (×2): qty 1

## 2020-10-23 MED ORDER — BISACODYL 10 MG RE SUPP
10.0000 mg | Freq: Once | RECTAL | Status: AC
  Administered 2020-10-23: 10 mg via RECTAL
  Filled 2020-10-23: qty 1

## 2020-10-23 MED ORDER — CHLORHEXIDINE GLUCONATE CLOTH 2 % EX PADS
6.0000 | MEDICATED_PAD | Freq: Every day | CUTANEOUS | Status: DC
  Administered 2020-10-23 – 2020-10-24 (×2): 6 via TOPICAL

## 2020-10-23 NOTE — Progress Notes (Signed)
[facility] [place] [facility] NAME: Randy [name]    MR#:  [id number]  DATE OF BIRTH:  1965/05/21  SUBJECTIVE:  CHIEF COMPLAINT:  No chief complaint on file. Seems uncomfortable and trying to have bowel movement.  Passing gas REVIEW OF SYSTEMS:  Review of Systems  Constitutional:  Negative for diaphoresis, fever, malaise/fatigue and weight loss.  HENT:  Negative for ear discharge, ear pain, hearing loss, nosebleeds, sore throat and tinnitus.   Eyes:  Negative for blurred vision and pain.  Respiratory:  Negative for cough, hemoptysis, shortness of breath and wheezing.   Cardiovascular:  Negative for chest pain, palpitations, orthopnea and leg swelling.  Gastrointestinal:  Positive for constipation. Negative for abdominal pain, blood in stool, diarrhea, heartburn, nausea and vomiting.  Genitourinary:  Negative for dysuria, frequency and urgency.       Urinary retention  Musculoskeletal:  Negative for back pain and myalgias.  Skin:  Negative for itching and rash.  Neurological:  Negative for dizziness, tingling, tremors, focal weakness, seizures, weakness and headaches.  Psychiatric/Behavioral:  Negative for depression. The patient is not nervous/anxious.   DRUG ALLERGIES:  No Known Allergies VITALS:  Blood pressure 130/73, pulse 74, temperature 98.6 F (37 C), temperature source Oral, resp. rate 20, SpO2 100 %. PHYSICAL EXAMINATION:  Physical Exam 55 year old male lying in the bed in pain and uncomfortable position Lungs clear to auscultation bilaterally, no wheezing rales rhonchi crepitation Cardiovascular S1-S2 normal no murmur rales or gallop Abdomen soft, incisional soreness, no distention, mild distention GU: Indwelling Foley Skin no rash or lesion Neuro alert and oriented, nonfocal exam Psych normal mood and affect LABORATORY PANEL:  Male CBC Recent Labs  Lab 10/23/20 0554  WBC 7.6  HGB 11.7*  HCT 31.8*  PLT 186    ------------------------------------------------------------------------------------------------------------------ Chemistries  Recent Labs  Lab 10/23/20 0554  NA 131*  K 3.9  CL 101  CO2 25  GLUCOSE 111*  BUN 15  CREATININE 1.14  CALCIUM 7.7*  AST 20  ALT 24  ALKPHOS 36*  BILITOT 0.9   RADIOLOGY:  CT ABDOMEN PELVIS WO CONTRAST  Result Date: 10/22/2020 CLINICAL DATA:  Abdominal distension and postoperative ileus status post hernia repair. EXAM: CT ABDOMEN AND PELVIS WITHOUT CONTRAST TECHNIQUE: Multidetector CT imaging of the abdomen and pelvis was performed following the standard protocol without IV contrast. COMPARISON:  None. FINDINGS: Lower chest: No acute abnormality. Hepatobiliary: No focal liver abnormality is seen. No gallstones, gallbladder wall thickening, or biliary dilatation. Pancreas: Unremarkable. No pancreatic ductal dilatation or surrounding inflammatory changes. Spleen: Normal in size without focal abnormality. Adrenals/Urinary Tract: Adrenal glands and kidneys appear normal. No hydronephrosis or renal obstruction is noted. Urinary bladder is decompressed secondary to Foley catheter. Stomach/Bowel: The stomach appears normal. The appendix appears normal. No small bowel dilatation is noted. Large amount of stool is seen in the sigmoid colon and rectum concerning for impaction. Mild wall thickening of the distal sigmoid colon and rectum is noted suggesting possible inflammation. Vascular/Lymphatic: Aortic atherosclerosis. No enlarged abdominal or pelvic lymph nodes. Reproductive: Prostate is unremarkable. Other: Expected postoperative changes are noted in the periumbilical region. No ascites is noted. Musculoskeletal: No acute or significant osseous findings. IMPRESSION: Large amount of stool is seen in the sigmoid colon and rectum concerning for impaction. Mild wall thickening of the distal sigmoid colon and rectum is noted suggesting possible inflammation. Expected  postoperative changes are noted in the periumbilical region. Electronically Signed   By: Jeneen Rinks  Murlean Caller M.D.   On: 10/22/2020 19:47   US Venous Img Lower Bilateral (DVT)  Result Date: 10/23/2020 CLINICAL DATA:  Bilateral lower extremity pain and edema for the past 3 days. Evaluate for DVT. EXAM: BILATERAL LOWER EXTREMITY VENOUS DOPPLER ULTRASOUND TECHNIQUE: Gray-scale sonography with graded compression, as well as color Doppler and duplex ultrasound were performed to evaluate the lower extremity deep venous systems from the level of the common femoral vein and including the common femoral, femoral, profunda femoral, popliteal and calf veins including the posterior tibial, peroneal and gastrocnemius veins when visible. The superficial great saphenous vein was also interrogated. Spectral Doppler was utilized to evaluate flow at rest and with distal augmentation maneuvers in the common femoral, femoral and popliteal veins. COMPARISON:  Bilateral lower extremity venous Doppler ultrasound-03/14/2012 FINDINGS: RIGHT LOWER EXTREMITY Common Femoral Vein: No evidence of thrombus. Normal compressibility, respiratory phasicity and response to augmentation. Saphenofemoral Junction: No evidence of thrombus. Normal compressibility and flow on color Doppler imaging. Profunda Femoral Vein: No evidence of thrombus. Normal compressibility and flow on color Doppler imaging. Femoral Vein: No evidence of thrombus. Normal compressibility, respiratory phasicity and response to augmentation. Popliteal Vein: No evidence of thrombus. Normal compressibility, respiratory phasicity and response to augmentation. Calf Veins: No evidence of thrombus. Normal compressibility and flow on color Doppler imaging. Superficial Great Saphenous Vein: No evidence of thrombus. Normal compressibility. Venous Reflux:  None. Other Findings:  None. LEFT LOWER EXTREMITY Common Femoral Vein: No evidence of thrombus. Normal compressibility, respiratory phasicity  and response to augmentation. Saphenofemoral Junction: No evidence of thrombus. Normal compressibility and flow on color Doppler imaging. Profunda Femoral Vein: No evidence of thrombus. Normal compressibility and flow on color Doppler imaging. Femoral Vein: No evidence of thrombus. Normal compressibility, respiratory phasicity and response to augmentation. Popliteal Vein: No evidence of thrombus. Normal compressibility, respiratory phasicity and response to augmentation. Calf Veins: No evidence of thrombus. Normal compressibility and flow on color Doppler imaging. Superficial Great Saphenous Vein: No evidence of thrombus. Normal compressibility. Venous Reflux:  None. Other Findings:  None. IMPRESSION: No evidence of DVT within either lower extremity. Electronically Signed   By: Sandi Mariscal M.D.   On: 10/23/2020 07:48   ASSESSMENT AND PLAN:  55 year old male with recent umbilical hernia surgery admitted for severe constipation likely leading to urinary retention requiring Foley  Severe constipation On aggressive bowel regimen.  Nursing just notified/later this afternoon that patient had 3 good bowel movement and feels relieved Will advance diet to regular  Acute urinary retention: Likely from constipation.  Urology recommended to leave indwelling Foley for at least a week followed by voiding trial at urology office follow-up and continue Flomax   Acute kidney injury: Postobstructive.  Now resolved since Foley has been placed   Hyponatremia: Improving with hydration   Hypertension: Controlled on amlodipine.   GERD: PPI therapy.   Umbilical hernia without obstruction and gangrene: Status postsurgical repair last Thursday.  Appreciate surgical input   Lower extremity edema bilaterally: Patient is at moderate to high risk for DVT due to recent surgical procedure and recent trip. bilateral lower extremity venous Dopplers.  Negative for DVT Patient serum albumin is 4.1.    There is no height  or weight on file to calculate BMI.  Net IO Since Admission: -5,998.13 mL [10/23/20 1633]      Status is: Inpatient  Remains inpatient appropriate because:Unsafe d/c plan  Dispo: The patient is from: Home  Anticipated d/c is to: Home              Patient currently is not medically stable to d/c.   Difficult to place patient No       DVT prophylaxis:       heparin injection 5,000 Units Start: 10/22/20 2200 SCDs Start: 10/22/20 1524 SCDs Start: 10/22/20 1505     Family Communication: "discussed with patient"   All the records are reviewed and case discussed with Care Management/Social Worker. Management plans discussed with the patient, nursing and they are in agreement.  CODE STATUS: Full Code Level of care: Med-Surg  TOTAL TIME TAKING CARE OF THIS PATIENT: 35 minutes.   More than 50% of the time was spent in counseling/coordination of care: YES  POSSIBLE D/C IN 1 DAYS, DEPENDING ON CLINICAL CONDITION.   Max Sane M.D on 10/23/2020 at 4:33 PM  [facility]   CC: Primary care physician; Denita Lung, MD  Note: This dictation was prepared with Dragon dictation along with smaller phrase technology. Any transcriptional errors that result from this process are unintentional.

## 2020-10-23 NOTE — Progress Notes (Signed)
[place] Hospital Day(s): 1.   Post op day(s): 7  Interval History:  Patient seen and examined No acute events or new complaints overnight.  Patient reports he is feeling much better today since having foley placed Incisional soreness No fever, chills, nausea, emesis He remains without leukocytosis; WBC 7.6K Renal function has normalized; sCr - 1.14; UO - 7L Hyponatremia improving; now 131 He has been on FLD; tolerating well He is passing flatus; no BM yet  Vital signs in last 24 hours: [min-max] current  Temp:  [97.5 F (36.4 C)-98.7 F (37.1 C)] 98.3 F (36.8 C) (06/09 0722) Pulse Rate:  [66-81] 66 (06/09 0722) Resp:  [18-20] 18 (06/09 0722) BP: (125-171)/(67-89) 131/71 (06/09 0722) SpO2:  [97 %-100 %] 100 % (06/09 0722) Weight:  [86.2 kg-93.4 kg] 86.2 kg (06/08 1032)             Intake/Output last 2 shifts:  06/08 0701 - 06/09 0700 In: 1796.9 [P.O.:360; I.V.:1436.9] Out: 7075 [Urine:7075]   Physical Exam:  Constitutional: alert, cooperative and no distress  Respiratory: breathing non-labored at rest  Cardiovascular: regular rate and sinus rhythm  Gastrointestinal: Soft, incisional soreness, non-distended, no rebound/guarding Integumentary: Umbilical incision is CDI with dermabond, no erythema, minimal ecchymosis   Labs:  CBC Latest Ref Rng & Units 10/23/2020 10/22/2020 10/22/2020  WBC 4.0 - 10.5 K/uL 7.6 8.5 9.2  Hemoglobin 13.0 - 17.0 g/dL 11.7(L) 13.8 13.6  Hematocrit 39.0 - 52.0 % 31.8(L) 37.2(L) 37.3(L)  Platelets 150 - 400 K/uL 186 206 201   CMP Latest Ref Rng & Units 10/23/2020 10/22/2020 06/19/2020  Glucose 70 - 99 mg/dL 111(H) 121(H) 102(H)  BUN 6 - 20 mg/dL 15 27(H) 9  Creatinine 0.61 - 1.24 mg/dL 1.14 2.62(H) 1.02  Sodium 135 - 145 mmol/L 131(L) 124(L) 141  Potassium 3.5 - 5.1 mmol/L 3.9 3.9 4.5  Chloride 98 - 111 mmol/L 101 91(L) 101  CO2 22 - 32 mmol/L 25 23 25   Calcium 8.9 - 10.3 mg/dL 7.7(L) 9.1 9.9  Total  Protein 6.5 - 8.1 g/dL 5.4(L) 6.7 7.4  Total Bilirubin 0.3 - 1.2 mg/dL 0.9 1.3(H) 0.7  Alkaline Phos 38 - 126 U/L 36(L) 46 58  AST 15 - 41 U/L 20 34 21  ALT 0 - 44 U/L 24 36 27     Imaging studies:   CT Abdomen/Pelvis (10/22/2020) personally reviewed which shows significant stool in the rectum/distal sigmoid, some mild thickening, no free air, no small bowel dilation, and radiologist report reviewed:  IMPRESSION: Large amount of stool is seen in the sigmoid colon and rectum concerning for impaction. Mild wall thickening of the distal sigmoid colon and rectum is noted suggesting possible inflammation.   Expected postoperative changes are noted in the periumbilical region.   Assessment/Plan: (ICD-10's: N17.9) 55 y.o. male with constipation as well as urinary retention resulting in AKI 7 days s/p umbilical hernia repair   - Okay to advance diet as tolerated   - Continue aggressive bowel regimen; added dulcolax suppository x1 + BID Miralax; another option would be magnesium citrate +/- enema   - Continue foley catheter x7 days; tamsulosin 0.4 mg daily; appreciate urology assistance   - Monitor abdominal examination             - Pain control prn (minimize narcotics as feasible); antiemetics prn             - Mobilization encouraged               -  further management per primary service; we will of course follow   All of the above findings and recommendations were discussed with the patient, and the medical team, and all of patient's questions were answered to hisexpressed satisfaction.  -- Edison Simon, PA-C [facility] 10/23/2020, 7:31 AM [phone] M-F: 7am - 4pm

## 2020-10-23 NOTE — Progress Notes (Signed)
Mobility Specialist - Progress Note    10/23/20 1558  Mobility  Range of Motion/Exercises Active  Level of Assistance Independent  Assistive Device None  Distance Ambulated (ft) 0 ft  Mobility Response Tolerated well  Mobility performed by Mobility specialist  $Mobility charge 1 Mobility    Pt sitting on recliner upon arrival. Pt reported edema in feet, declining OOB activity at this time. Participated in seated exercises: leg raises, abduction, ankle pumps, heel slides, hip isometrics, and quad sets independently. Pt left in recliner, needs in reach.   Kathee Delton Mobility Specialist 10/23/20, 4:05 PM

## 2020-10-24 DIAGNOSIS — K5900 Constipation, unspecified: Secondary | ICD-10-CM

## 2020-10-24 LAB — BASIC METABOLIC PANEL
Anion gap: 8 (ref 5–15)
BUN: 11 mg/dL (ref 6–20)
CO2: 26 mmol/L (ref 22–32)
Calcium: 8.4 mg/dL — ABNORMAL LOW (ref 8.9–10.3)
Chloride: 104 mmol/L (ref 98–111)
Creatinine, Ser: 1.01 mg/dL (ref 0.61–1.24)
GFR, Estimated: >60 mL/min (ref >60)
Glucose, Bld: 101 mg/dL — ABNORMAL HIGH (ref 70–99)
Potassium: 4.1 mmol/L (ref 3.5–5.1)
Sodium: 138 mmol/L (ref 135–145)

## 2020-10-24 LAB — CBC
HCT: 34.6 % — ABNORMAL LOW (ref 39.0–52.0)
Hemoglobin: 12.2 g/dL — ABNORMAL LOW (ref 13.0–17.0)
MCH: 31 pg (ref 26.0–34.0)
MCHC: 35.3 g/dL (ref 30.0–36.0)
MCV: 88 fL (ref 80.0–100.0)
Platelets: 200 10*3/uL (ref 150–400)
RBC: 3.93 MIL/uL — ABNORMAL LOW (ref 4.22–5.81)
RDW: 12.2 % (ref 11.5–15.5)
WBC: 5.7 10*3/uL (ref 4.0–10.5)
nRBC: 0 % (ref 0.0–0.2)

## 2020-10-24 LAB — URINE CULTURE: Culture: NO GROWTH

## 2020-10-24 MED ORDER — POLYETHYLENE GLYCOL 3350 17 G PO PACK: 17.0000 g | PACK | Freq: Two times a day (BID) | ORAL | 0 refills | Status: AC

## 2020-10-24 NOTE — Progress Notes (Addendum)
[place] Hospital Day(s): 2.   Post op day(s): 8  Interval History:  Patient seen and examined No acute events or new complaints overnight.  Patient reports he is feeling much better this morning No abdominal pain, nausea, emesis His labs are reassuring; renal function remains at baseline Foley continued; UO - 3.9L He has had multiple bowel movements in the last 24 hours On regular diet; tolerating well  Vital signs in last 24 hours: [min-max] current  Temp:  [98 F (36.7 C)-98.9 F (37.2 C)] 98 F (36.7 C) (06/10 0457) Pulse Rate:  [58-74] 59 (06/10 0457) Resp:  [16-20] 18 (06/10 0457) BP: (115-130)/(70-86) 125/86 (06/10 0457) SpO2:  [98 %-100 %] 100 % (06/10 0457)             Intake/Output last 2 shifts:  06/09 0701 - 06/10 0700 In: 960 [P.O.:960] Out: 3975 [Urine:3975]   Physical Exam: Constitutional: alert, cooperative and no distress Respiratory: breathing non-labored at rest Cardiovascular: regular rate and sinus rhythm Gastrointestinal: Soft, non-tender, non-distended, no rebound/guarding Integumentary: Umbilical incision is CDI with dermabond, no erythema, minimal ecchymosis   Labs:  CBC Latest Ref Rng & Units 10/24/2020 10/23/2020 10/22/2020  WBC 4.0 - 10.5 K/uL 5.7 7.6 8.5  Hemoglobin 13.0 - 17.0 g/dL 12.2(L) 11.7(L) 13.8  Hematocrit 39.0 - 52.0 % 34.6(L) 31.8(L) 37.2(L)  Platelets 150 - 400 K/uL 200 186 206   CMP Latest Ref Rng & Units 10/24/2020 10/23/2020 10/22/2020  Glucose 70 - 99 mg/dL 101(H) 111(H) 121(H)  BUN 6 - 20 mg/dL 11 15 27(H)  Creatinine 0.61 - 1.24 mg/dL 1.01 1.14 2.62(H)  Sodium 135 - 145 mmol/L 138 131(L) 124(L)  Potassium 3.5 - 5.1 mmol/L 4.1 3.9 3.9  Chloride 98 - 111 mmol/L 104 101 91(L)  CO2 22 - 32 mmol/L 26 25 23   Calcium 8.9 - 10.3 mg/dL 8.4(L) 7.7(L) 9.1  Total Protein 6.5 - 8.1 g/dL - 5.4(L) 6.7  Total Bilirubin 0.3 - 1.2 mg/dL - 0.9 1.3(H)  Alkaline Phos 38 - 126 U/L - 36(L) 46  AST 15 -  41 U/L - 20 34  ALT 0 - 44 U/L - 24 36     Imaging studies: No new pertinent imaging studies   Assessment/Plan: (ICD-10's: N17.9) 55 y.o. male with, now resolving, constipation as well as urinary retention resulting in AKI 8 days s/p umbilical hernia repair   - Continue regular diet  - Continue bowel regimen (Miralax) for home as needed   - Continue foley catheter x7 days; tamsulosin 0.4 mg daily; appreciate urology assistance; has follow up for 06/15  - Monitor abdominal examination             - Pain control prn (minimize narcotics as feasible); antiemetics prn             - Mobilization encouraged               - Further management per primary service   - Discharge Planning: Okay for discharge from surgical standpoint, follow up next week (06/15) with urology for potential foley removal, and maintain surgical follow up appointment with Dr Hampton Abbot (06/22).   All of the above findings and recommendations were discussed with the patient, and the medical team, and all of patient's questions were answered to his expressed satisfaction.  -- Edison Simon, PA-C [facility] 10/24/2020, 7:29 AM [phone] M-F: 7am - 4pm

## 2020-10-26 NOTE — Discharge Summary (Signed)
[facility] NAME: Randy [name]    MR#:  [id number]  DATE OF BIRTH:  [birth year]  DATE OF ADMISSION:  10/22/2020   ADMITTING PHYSICIAN: Para Skeans, MD  DATE OF DISCHARGE: 10/24/2020  3:39 PM  PRIMARY CARE PHYSICIAN: Denita Lung, MD   ADMISSION DIAGNOSIS:  Abdominal pain [R10.9] DISCHARGE DIAGNOSIS:  Principal Problem:   Acute urinary retention Active Problems:   Essential hypertension   GERD   Umbilical hernia without obstruction and without gangrene   AKI (acute kidney injury) ([place])   Constipation  SECONDARY DIAGNOSIS:   Past Medical History:  Diagnosis Date   Family history of ischemic heart disease    Febrile seizure ([place])    AS A CHILD   GERD (gastroesophageal reflux disease)    RARE- NO MEDS   History of hiatal hernia    SMALL   Hypertension    Obesity    Psoriasis    Seasonal allergies    Venous insufficiency    HOSPITAL COURSE:  55 year old male with recent umbilical hernia surgery admitted for severe constipation likely leading to urinary retention requiring Foley   Severe constipation Resolved with bowel regimen   Acute urinary retention: Likely from constipation.  Urology recommended to leave indwelling Foley for at least a week followed by voiding trial at urology office follow-up  continue Flomax   Acute kidney injury: Postobstructive.  Now resolved since Foley has been placed   Hyponatremia: Resolved with hydration   Hypertension: Controlled on amlodipine.   GERD: PPI therapy.   Umbilical hernia without obstruction and gangrene: Status postsurgical repair last Thursday.  Outpatient surgery follow-up   Minimal lower extremity edema bilaterally: bilateral lower extremity venous Dopplers.  Negative for DVT.  Encourage ambulation Patient serum albumin is 4.1.  DISCHARGE CONDITIONS:  Stable CONSULTS OBTAINED:  Treatment Team:  Jules Husbands, MD DRUG ALLERGIES:  No Known  Allergies DISCHARGE MEDICATIONS:   Allergies as of 10/24/2020   No Known Allergies      Medication List     TAKE these medications    acetaminophen 500 MG tablet Commonly known as: TYLENOL Take 2 tablets (1,000 mg total) by mouth every 6 (six) hours as needed for mild pain.   amLODipine 10 MG tablet Commonly known as: NORVASC Take 1 tablet (10 mg total) by mouth daily. What changed: when to take this   aspirin 81 MG tablet Take 81 mg by mouth daily.   finasteride 5 MG tablet Commonly known as: PROSCAR TAKE 1 TABLET BY MOUTH EVERY DAY What changed:  how much to take when to take this   Fish Oil 1200 MG Caps Take 2,400 mg by mouth daily.   ibuprofen 600 MG tablet Commonly known as: ADVIL Take 1 tablet (600 mg total) by mouth every 8 (eight) hours as needed for moderate pain.   loratadine 10 MG tablet Commonly known as: CLARITIN Take 10 mg by mouth every morning.   multivitamin with minerals Tabs tablet Take 1 tablet by mouth daily.   ondansetron 4 MG tablet Commonly known as: Zofran Take 1 tablet (4 mg total) by mouth every 8 (eight) hours as needed for nausea or vomiting.   oxyCODONE 5 MG immediate release tablet Commonly known as: Oxy IR/ROXICODONE Take 1 tablet (5 mg total) by mouth every 4 (four) hours as needed for severe pain.   polyethylene glycol 17 g packet Commonly known as: MIRALAX / GLYCOLAX Take 17  g by mouth 2 (two) times daily.   ProAir RespiClick 983 (90 Base) MCG/ACT Aepb Generic drug: Albuterol Sulfate INHALE 2 PUFFS INTO THE LUNGS 4 (FOUR) TIMES DAILY AS NEEDED. What changed: See the new instructions.   rosuvastatin 20 MG tablet Commonly known as: Crestor Take 1 tablet (20 mg total) by mouth daily. What changed: when to take this   tamsulosin 0.4 MG Caps capsule Commonly known as: Flomax Take 1 capsule (0.4 mg total) by mouth daily.       DISCHARGE INSTRUCTIONS:   DIET:  Regular diet DISCHARGE CONDITION:   Stable ACTIVITY:  Activity as tolerated OXYGEN:  Home Oxygen: No.  Oxygen Delivery: room air DISCHARGE LOCATION:  home   If you experience worsening of your admission symptoms, develop shortness of breath, life threatening emergency, suicidal or homicidal thoughts you must seek medical attention immediately by calling 911 or calling your MD immediately  if symptoms less severe.  You Must read complete instructions/literature along with all the possible adverse reactions/side effects for all the Medicines you take and that have been prescribed to you. Take any new Medicines after you have completely understood and accpet all the possible adverse reactions/side effects.   Please note  You were cared for by a hospitalist during your hospital stay. If you have any questions about your discharge medications or the care you received while you were in the hospital after you are discharged, you can call the unit and asked to speak with the hospitalist on call if the hospitalist that took care of you is not available. Once you are discharged, your primary care physician will handle any further medical issues. Please note that NO REFILLS for any discharge medications will be authorized once you are discharged, as it is imperative that you return to your primary care physician (or establish a relationship with a primary care physician if you do not have one) for your aftercare needs so that they can reassess your need for medications and monitor your lab values.    On the day of Discharge:  VITAL SIGNS:  Blood pressure (!) 143/84, pulse 61, temperature 98 F (36.7 C), temperature source Oral, resp. rate 18, SpO2 100 %. PHYSICAL EXAMINATION:  GENERAL:  55 y.o.-year-old patient lying in the bed with no acute distress.  EYES: Pupils equal, round, reactive to light and accommodation. No scleral icterus. Extraocular muscles intact.  HEENT: Head atraumatic, normocephalic. Oropharynx and nasopharynx clear.   NECK:  Supple, no jugular venous distention. No thyroid enlargement, no tenderness.  LUNGS: Normal breath sounds bilaterally, no wheezing, rales,rhonchi or crepitation. No use of accessory muscles of respiration.  CARDIOVASCULAR: S1, S2 normal. No murmurs, rubs, or gallops.  ABDOMEN: Soft, non-tender, non-distended. Bowel sounds present. No organomegaly or mass.  EXTREMITIES: No pedal edema, cyanosis, or clubbing.  NEUROLOGIC: Cranial nerves II through XII are intact. Muscle strength 5/5 in all extremities. Sensation intact. Gait not checked.  PSYCHIATRIC: The patient is alert and oriented x 3.  SKIN: No obvious rash, lesion, or ulcer.  DATA REVIEW:   CBC Recent Labs  Lab 10/24/20 0413  WBC 5.7  HGB 12.2*  HCT 34.6*  PLT 200    Chemistries  Recent Labs  Lab 10/23/20 0554 10/24/20 0413  NA 131* 138  K 3.9 4.1  CL 101 104  CO2 25 26  GLUCOSE 111* 101*  BUN 15 11  CREATININE 1.14 1.01  CALCIUM 7.7* 8.4*  AST 20  --   ALT 24  --  ALKPHOS 36*  --   BILITOT 0.9  --      Outpatient follow-up  Follow-up Information     Hollice Espy, MD. Go on 10/29/2020.   Specialty: Urology Why: Urinary retention, potential foley removal Contact information: [address] [place] [address] [phone]         Olean Ree, MD. Go on 11/05/2020.   Specialty: General Surgery Why: follow up umbilical hernia repair Contact information: [address] [place] [address] [phone]         Denita Lung, MD. Schedule an appointment as soon as possible for a visit.   Specialty: Family Medicine Why: [facility] Discharge F/UP Contact information: [place] [address] [phone]                 30 Day Unplanned Readmission Risk Score    Flowsheet Row Admission (Discharged) from 10/22/2020 in [facility]  30 Day Unplanned Readmission Risk Score (%)  7.38 Filed at 10/24/2020 1200       This score is the patient's risk of an unplanned readmission within 30 days of being discharged (0 -100%). The score is based on dignosis, age, lab data, medications, orders, and past utilization.   Low:  0-14.9   Medium: 15-21.9   High: 22-29.9   Extreme: 30 and above           Management plans discussed with the patient, family and they are in agreement.  CODE STATUS: Prior   TOTAL TIME TAKING CARE OF THIS PATIENT: 45 minutes.    Max Sane M.D on 10/26/2020 at 2:41 PM  [facility]   CC: Primary care physician; Denita Lung, MD   Note: This dictation was prepared with Dragon dictation along with smaller phrase technology. Any transcriptional errors that result from this process are unintentional.

## 2020-10-28 ENCOUNTER — Encounter: Payer: Self-pay | Admitting: Surgery

## 2020-10-28 ENCOUNTER — Telehealth: Payer: Self-pay

## 2020-10-28 NOTE — Telephone Encounter (Signed)
I will be leaving early today at 12:30 so if you could forward this message to  someone else up front.

## 2020-10-28 NOTE — Telephone Encounter (Signed)
Pt. Called wanting to know if he could switch his hospital f/u to a virtual because he wanted to do it tomorrow because he was trying to go to the beach tomorrow afternoon. He wanted to know if you could squeeze him in tomorrow sometime or could he do it virtually tomorrow.

## 2020-10-29 ENCOUNTER — Other Ambulatory Visit: Payer: Self-pay

## 2020-10-29 ENCOUNTER — Telehealth: Payer: Self-pay

## 2020-10-29 ENCOUNTER — Ambulatory Visit (INDEPENDENT_AMBULATORY_CARE_PROVIDER_SITE_OTHER): Payer: BC Managed Care – PPO | Admitting: Urology

## 2020-10-29 VITALS — BP 147/77 | HR 60 | Ht 70.0 in | Wt 190.0 lb

## 2020-10-29 DIAGNOSIS — R339 Retention of urine, unspecified: Secondary | ICD-10-CM | POA: Diagnosis not present

## 2020-10-29 MED ORDER — CEPHALEXIN 250 MG PO CAPS
500.0000 mg | ORAL_CAPSULE | Freq: Once | ORAL | Status: AC
  Administered 2020-10-29: 500 mg via ORAL

## 2020-10-29 NOTE — Progress Notes (Signed)
10/29/2020 8:33 AM   Hale Drone [birth year] [id number]  Referring provider: Denita Lung, MD [address] [place],  [place] [address]  Chief Complaint  Patient presents with   Urinary Retention    HPI: 55 year old male who presents today for voiding trial.  Please see last week's note for detail.  In summary, he presented with severe constipation and urinary retention following umbilical hernia repair surgery.  At the time of Foley catheter placement, he had nearly 2 L in his bladder and was in renal failure.  He ended up getting admitted for hyponatremia as well as severe ongoing constipation.  He was ultimately discharged and returns today for voiding trial.  Overall, he is feeling well.  He has had return of bowel function as well as improvement in his lower extremity edema.  His creatinine returned to baseline prior to discharge.  Notably today, he reports that he did have some baseline urinary urgency frequency and nocturia.  He is followed by Dr. Junious Silk primarily.  He continues take Flomax.  He did have a successful voiding trial, bladder was instilled with about 300 cc of saline he was able to void about 250 spontaneously without difficulty.   PMH: Past Medical History:  Diagnosis Date   Family history of ischemic heart disease    Febrile seizure ([place])    AS A CHILD   GERD (gastroesophageal reflux disease)    RARE- NO MEDS   History of hiatal hernia    SMALL   Hypertension    Obesity    Psoriasis    Seasonal allergies    Venous insufficiency     Surgical History: Past Surgical History:  Procedure Laterality Date   CYSTECTOMY     left chest   superficial vein removed Bilateral    LEGS   TOE SURGERY     right great toe   UMBILICAL HERNIA REPAIR N/A 10/16/2020   Procedure: HERNIA REPAIR UMBILICAL ADULT;  Surgeon: Olean Ree, MD;  Location: [facility] ORS;  Service: General;  Laterality: N/A;    Home Medications:  Allergies as of 10/29/2020    No Known Allergies      Medication List        Accurate as of October 29, 2020  8:33 AM. If you have any questions, ask your nurse or doctor.          STOP taking these medications    ondansetron 4 MG tablet Commonly known as: Zofran Stopped by: Hollice Espy, MD   oxyCODONE 5 MG immediate release tablet Commonly known as: Oxy IR/ROXICODONE Stopped by: Hollice Espy, MD       TAKE these medications    acetaminophen 500 MG tablet Commonly known as: TYLENOL Take 2 tablets (1,000 mg total) by mouth every 6 (six) hours as needed for mild pain.   amLODipine 10 MG tablet Commonly known as: NORVASC Take 1 tablet (10 mg total) by mouth daily.   aspirin 81 MG tablet Take 81 mg by mouth daily.   finasteride 5 MG tablet Commonly known as: PROSCAR TAKE 1 TABLET BY MOUTH EVERY DAY   Fish Oil 1200 MG Caps Take 2,400 mg by mouth daily.   ibuprofen 600 MG tablet Commonly known as: ADVIL Take 1 tablet (600 mg total) by mouth every 8 (eight) hours as needed for moderate pain.   loratadine 10 MG tablet Commonly known as: CLARITIN Take 10 mg by mouth every morning.   multivitamin with minerals Tabs tablet Take 1 tablet by mouth  daily.   polyethylene glycol 17 g packet Commonly known as: MIRALAX / GLYCOLAX Take 17 g by mouth 2 (two) times daily.   ProAir RespiClick 384 (90 Base) MCG/ACT Aepb Generic drug: Albuterol Sulfate INHALE 2 PUFFS INTO THE LUNGS 4 (FOUR) TIMES DAILY AS NEEDED.   rosuvastatin 20 MG tablet Commonly known as: Crestor Take 1 tablet (20 mg total) by mouth daily.   tamsulosin 0.4 MG Caps capsule Commonly known as: Flomax Take 1 capsule (0.4 mg total) by mouth daily.        Allergies: No Known Allergies  Family History: Family History  Problem Relation Age of Onset   Heart disease Father    Hypertension Father    Heart failure Father    Heart attack Father 50       both age 11 & 65   Colon cancer Neg Hx     Social History:   reports that he has never smoked. He has never used smokeless tobacco. He reports current alcohol use of about 3.0 standard drinks of alcohol per week. He reports that he does not use drugs.   Physical Exam: BP (!) 147/77   Pulse 60   Ht 5\' 10"  (1.778 m)   Wt 190 lb (86.2 kg)   BMI 27.26 kg/m   Constitutional:  Alert and oriented, No acute distress.  Accompanied by wife today. HEENT: [place] AT, moist mucus membranes.  Trachea midline, no masses. Cardiovascular: No clubbing, cyanosis, or edema. Respiratory: Normal respiratory effort, no increased work of breathing. Skin: No rashes, bruises or suspicious lesions. Neurologic: Grossly intact, no focal deficits, moving all 4 extremities. Psychiatric: Normal mood and affect.  Laboratory Data: Lab Results  Component Value Date   CREATININE 1.01 10/24/2020    PSA 0.3 on 06/19/20  Urinalysis  Assessment & Plan:    1. Urinary retention Successful voiding trial today, very strict warning symptoms were reviewed and indication for return to clinic for reassessment later today if difficulty or unable to void.  He is agreeable this plan.  Recommend continuation of Flomax for the time being in addition to low-dose finasteride which he was already taking for hematospermia.  We discussed, given the severity of his retention resulting in renal failure, he should be followed closely at least for the short-term.  He likely does have some underlying BPH contributing to the aforementioned presentation which was exacerbated by narcotics and severe constipation.  It is unclear how significant his underlying BPH played in his retention.  He would like to follow-up with Dr. Junious Silk, have urged him to do this in the next month or so to reassess his baseline voiding symptoms as well as for PVR.  He is agreeable to this plan.  He was given a dose of periprocedural Keflex today at the time of voiding trial.  Warning symptoms for UTI were reviewed. - cephALEXin  (KEFLEX) capsule 500 mg    Hollice Espy, MD  [facility] [address] [place], [place] [address] [phone]  I spent 30 total minutes on the day of the encounter including pre-visit review of the medical record, face-to-face time with the patient, and post visit ordering of labs/imaging/tests.  Specifically, I reviewed his history from his recent admission, the CT scan during the admission as well as spent face-to-face time with the patient following his voiding trial.

## 2020-10-29 NOTE — Telephone Encounter (Signed)
Looks like pt has already be scheduled for tomorrow morning

## 2020-10-29 NOTE — Progress Notes (Signed)
Fill and Pull Catheter Removal  Patient is present today for a catheter removal.  Patient was cleaned and prepped in a sterile fashion 300 ml of sterile water/ saline was instilled into the bladder when the patient felt the urge to urinate. 9 ml of water was then drained from the balloon.  A 18 FR foley cath was removed from the bladder no complications were noted .  Patient as then given some time to void on their own.  Patient can void  250 ml on their own after some time.  Patient tolerated well.  Performed by: Kerman Passey, RMA  Follow up/ Additional notes: If unable to urinate by this afternoon pt will come into office, if not will follow up with Dr. Junious Silk.

## 2020-10-29 NOTE — Telephone Encounter (Signed)
Bruising normal after surgery-could linger for several weeks. May try applying ice pack for the swelling, otherwise it all looks good.

## 2020-10-30 ENCOUNTER — Ambulatory Visit: Payer: BC Managed Care – PPO | Admitting: Family Medicine

## 2020-10-30 VITALS — BP 130/72 | HR 68 | Temp 97.9°F | Ht 70.0 in | Wt 193.6 lb

## 2020-10-30 DIAGNOSIS — Z9889 Other specified postprocedural states: Secondary | ICD-10-CM | POA: Diagnosis not present

## 2020-10-30 DIAGNOSIS — K5909 Other constipation: Secondary | ICD-10-CM

## 2020-10-30 DIAGNOSIS — R338 Other retention of urine: Secondary | ICD-10-CM

## 2020-10-30 DIAGNOSIS — Z8719 Personal history of other diseases of the digestive system: Secondary | ICD-10-CM

## 2020-10-30 LAB — COMPREHENSIVE METABOLIC PANEL
ALT: 45 IU/L — ABNORMAL HIGH (ref 0–44)
AST: 34 IU/L (ref 0–40)
Albumin/Globulin Ratio: 2.6 — ABNORMAL HIGH (ref 1.2–2.2)
Albumin: 4.9 g/dL (ref 3.8–4.9)
Alkaline Phosphatase: 54 IU/L (ref 44–121)
BUN/Creatinine Ratio: 14 (ref 9–20)
BUN: 14 mg/dL (ref 6–24)
Bilirubin Total: 0.4 mg/dL (ref 0.0–1.2)
CO2: 25 mmol/L (ref 20–29)
Calcium: 9.8 mg/dL (ref 8.7–10.2)
Chloride: 100 mmol/L (ref 96–106)
Creatinine, Ser: 1.03 mg/dL (ref 0.76–1.27)
Globulin, Total: 1.9 g/dL (ref 1.5–4.5)
Glucose: 80 mg/dL (ref 65–99)
Potassium: 4.2 mmol/L (ref 3.5–5.2)
Sodium: 142 mmol/L (ref 134–144)
Total Protein: 6.8 g/dL (ref 6.0–8.5)
eGFR: 86 mL/min/{1.73_m2} (ref >59)

## 2020-10-30 NOTE — Progress Notes (Signed)
   Subjective:    Patient ID: Randy [name], male    DOB: 01/24/1966, 55 y.o.   MRN: [id number]  HPI He is here for follow-up after recent surgery for umbilical hernia repair and subsequent urinary retention and constipation.  Currently the constipation cause difficulty with urinary retention.  He was admitted to the hospital and was shown to be hyponatremic as well as abnormal renal function.  Also calcium score was low.  Apparently 3 L of fluid was taken out of his bladder.  A catheter was placed.  He had the catheter removed yesterday.  He seems to be urinating much better and presently is using Flomax.  He is also using MiraLAX on a regular basis and getting his bowel habits back to normal.   Review of Systems     Objective:   Physical Exam Alert and in no distress.  Abdominal exam shows no masses or tenderness.  Scarring around the umbilicus is noted. Discharge summary and lab data was reviewed.  Kidney function did start to come back.  Calcium level is still low.      Assessment & Plan:  Acute urinary retention - Plan: Comprehensive metabolic panel  Other constipation  History of umbilical hernia repair Encouraged him to continue with his present diet and exercise and the use of MiraLAX.  Discussed fluids, bulk in his diet, exercise.

## 2020-10-31 ENCOUNTER — Encounter: Payer: Self-pay | Admitting: Family Medicine

## 2020-10-31 ENCOUNTER — Encounter: Payer: BC Managed Care – PPO | Admitting: Surgery

## 2020-11-01 ENCOUNTER — Encounter (HOSPITAL_BASED_OUTPATIENT_CLINIC_OR_DEPARTMENT_OTHER): Payer: Self-pay

## 2020-11-01 ENCOUNTER — Emergency Department (HOSPITAL_BASED_OUTPATIENT_CLINIC_OR_DEPARTMENT_OTHER)
Admission: EM | Admit: 2020-11-01 | Discharge: 2020-11-01 | Disposition: A | Discharge status: Home / Self Care | Payer: BC Managed Care – PPO | Attending: Emergency Medicine | Admitting: Emergency Medicine

## 2020-11-01 ENCOUNTER — Other Ambulatory Visit: Payer: Self-pay

## 2020-11-01 DIAGNOSIS — R339 Retention of urine, unspecified: Secondary | ICD-10-CM | POA: Diagnosis not present

## 2020-11-01 DIAGNOSIS — I10 Essential (primary) hypertension: Secondary | ICD-10-CM | POA: Insufficient documentation

## 2020-11-01 DIAGNOSIS — Z7982 Long term (current) use of aspirin: Secondary | ICD-10-CM | POA: Insufficient documentation

## 2020-11-01 DIAGNOSIS — Z79899 Other long term (current) drug therapy: Secondary | ICD-10-CM | POA: Diagnosis not present

## 2020-11-01 LAB — CBC WITH DIFFERENTIAL/PLATELET
Abs Immature Granulocytes: 0.02 10*3/uL (ref 0.00–0.07)
Basophils Absolute: 0.1 10*3/uL (ref 0.0–0.1)
Basophils Relative: 1 %
Eosinophils Absolute: 0.2 10*3/uL (ref 0.0–0.5)
Eosinophils Relative: 3 %
HCT: 38.4 % — ABNORMAL LOW (ref 39.0–52.0)
Hemoglobin: 13.1 g/dL (ref 13.0–17.0)
Immature Granulocytes: 0 %
Lymphocytes Relative: 42 %
Lymphs Abs: 2.7 10*3/uL (ref 0.7–4.0)
MCH: 30.5 pg (ref 26.0–34.0)
MCHC: 34.1 g/dL (ref 30.0–36.0)
MCV: 89.5 fL (ref 80.0–100.0)
Monocytes Absolute: 0.5 10*3/uL (ref 0.1–1.0)
Monocytes Relative: 8 %
Neutro Abs: 2.9 10*3/uL (ref 1.7–7.7)
Neutrophils Relative %: 46 %
Platelets: 313 10*3/uL (ref 150–400)
RBC: 4.29 MIL/uL (ref 4.22–5.81)
RDW: 12.1 % (ref 11.5–15.5)
WBC: 6.4 10*3/uL (ref 4.0–10.5)
nRBC: 0 % (ref 0.0–0.2)

## 2020-11-01 LAB — BASIC METABOLIC PANEL
Anion gap: 8 (ref 5–15)
BUN: 13 mg/dL (ref 6–20)
CO2: 30 mmol/L (ref 22–32)
Calcium: 9.4 mg/dL (ref 8.9–10.3)
Chloride: 103 mmol/L (ref 98–111)
Creatinine, Ser: 1.01 mg/dL (ref 0.61–1.24)
GFR, Estimated: >60 mL/min (ref >60)
Glucose, Bld: 104 mg/dL — ABNORMAL HIGH (ref 70–99)
Potassium: 3.4 mmol/L — ABNORMAL LOW (ref 3.5–5.1)
Sodium: 141 mmol/L (ref 135–145)

## 2020-11-01 LAB — URINALYSIS, ROUTINE W REFLEX MICROSCOPIC
Bilirubin Urine: NEGATIVE
Glucose, UA: NEGATIVE mg/dL
Hgb urine dipstick: NEGATIVE
Ketones, ur: NEGATIVE mg/dL
Leukocytes,Ua: NEGATIVE
Nitrite: NEGATIVE
Protein, ur: NEGATIVE mg/dL
Specific Gravity, Urine: 1.007 (ref 1.005–1.030)
pH: 6.5 (ref 5.0–8.0)

## 2020-11-01 MED ORDER — TAMSULOSIN HCL 0.4 MG PO CAPS: 0.8000 mg | ORAL_CAPSULE | Freq: Every day | ORAL | 0 refills | Status: AC

## 2020-11-01 NOTE — ED Triage Notes (Signed)
Patient here POV from Home with Urinary Retention.   Patient had a Foley Catheter placed for 1 week for Urinary Retention after Surgery and AKI.   Patient had Foley Catheter removed Wednesday and since then the patient has been having trouble urinating.   Patient has been able to urinate but patient has been having frequency more recently and Fullness.  Ambulatory, GCS 15.

## 2020-11-01 NOTE — ED Provider Notes (Signed)
[facility] EMERGENCY DEPT Provider Note   CSN: [id number] Arrival date & time: 11/01/20  2009     History Chief Complaint  Patient presents with   Urinary Retention    Randy [name] is a 55 y.o. male.  HPI 55 year old male presents with concern for urinary retention.  He had acute urinary retention after surgery earlier this month as well as constipation.  He had over 2 L of urine in his bladder with an acute kidney injury and had to be temporarily admitted to the [facility] hospital.  Since then he has had the Foley catheter removed on 6/15.  He has been getting up multiple times in the night to urinate to make sure he is able to empty his bladder.  He is not sure if he fully empties or not.  He has not had any dysuria.  Tonight he felt some fullness, mostly in his penis and was worried that he was retaining again so he came into the emergency department.  No back pain or abdominal pain.  Past Medical History:  Diagnosis Date   Family history of ischemic heart disease    Febrile seizure ([place])    AS A CHILD   GERD (gastroesophageal reflux disease)    RARE- NO MEDS   History of hiatal hernia    SMALL   Hypertension    Obesity    Psoriasis    Seasonal allergies    Venous insufficiency     Patient Active Problem List   Diagnosis Date Noted   History of umbilical hernia repair 13/24/4010   Constipation    AKI (acute kidney injury) ([place]) 10/22/2020   Acute urinary retention 10/22/2020   Abdominal pain 27/25/3664   Umbilical hernia without obstruction and without gangrene    Seasonal allergic rhinitis due to pollen 06/13/2017   Psoriasis 05/06/2014   Nonspecific abnormal electrocardiogram (ECG) (EKG) 01/11/2012   Family history of heart disease in male family member before age 84 03/29/2011   Overweight (BMI 25.0-29.9) 03/29/2011   Hyperlipidemia LDL goal <100 03/31/2010   Essential hypertension 03/20/2010   GERD 09/23/2008    Past Surgical History:   Procedure Laterality Date   CYSTECTOMY     left chest   superficial vein removed Bilateral    LEGS   TOE SURGERY     right great toe   UMBILICAL HERNIA REPAIR N/A 10/16/2020   Procedure: HERNIA REPAIR UMBILICAL ADULT;  Surgeon: Olean Ree, MD;  Location: [facility] ORS;  Service: General;  Laterality: N/A;       Family History  Problem Relation Age of Onset   Heart disease Father    Hypertension Father    Heart failure Father    Heart attack Father 46       both age 1 & 59   Colon cancer Neg Hx     Social History   Tobacco Use   Smoking status: Never   Smokeless tobacco: Never  Vaping Use   Vaping Use: Never used  Substance Use Topics   Alcohol use: Yes    Alcohol/week: 3.0 standard drinks    Types: 3 Standard drinks or equivalent per week    Comment: OCC   Drug use: No    Home Medications Prior to Admission medications   Medication Sig Start Date End Date Taking? Authorizing Provider  acetaminophen (TYLENOL) 500 MG tablet Take 2 tablets (1,000 mg total) by mouth every 6 (six) hours as needed for mild pain. 10/16/20   Olean Ree, MD  amLODipine (NORVASC) 10 MG tablet Take 1 tablet (10 mg total) by mouth daily. 06/19/20   Denita Lung, MD  aspirin 81 MG tablet Take 81 mg by mouth daily.    [provider]  finasteride (PROSCAR) 5 MG tablet TAKE 1 TABLET BY MOUTH EVERY DAY 06/16/20   Denita Lung, MD  ibuprofen (ADVIL) 600 MG tablet Take 1 tablet (600 mg total) by mouth every 8 (eight) hours as needed for moderate pain. Patient not taking: Reported on 10/30/2020 10/16/20   Olean Ree, MD  loratadine (CLARITIN) 10 MG tablet Take 10 mg by mouth every morning.    [provider]  Multiple Vitamin (MULTIVITAMIN WITH MINERALS) TABS Take 1 tablet by mouth daily.    [provider]  Omega-3 Fatty Acids (FISH OIL) 1200 MG CAPS Take 2,400 mg by mouth daily.    [provider]  polyethylene glycol (MIRALAX / GLYCOLAX) 17 g packet Take 17 g  by mouth 2 (two) times daily. 10/24/20   Max Sane, MD  PROAIR RESPICLICK 782 (90 Base) MCG/ACT AEPB INHALE 2 PUFFS INTO THE LUNGS 4 (FOUR) TIMES DAILY AS NEEDED. 02/13/18   Denita Lung, MD  rosuvastatin (CRESTOR) 20 MG tablet Take 1 tablet (20 mg total) by mouth daily. 06/19/20   Denita Lung, MD  tamsulosin (FLOMAX) 0.4 MG CAPS capsule Take 2 capsules (0.8 mg total) by mouth daily. 11/01/20 12/01/20  Sherwood Gambler, MD    Allergies    Patient has no known allergies.  Review of Systems   Review of Systems  Gastrointestinal:  Negative for abdominal pain and vomiting.  Genitourinary:  Negative for dysuria and flank pain.  Musculoskeletal:  Negative for back pain.  All other systems reviewed and are negative.  Physical Exam Updated Vital Signs BP (!) 153/89   Pulse 84   Temp 98.2 F (36.8 C) (Oral)   Resp 18   Ht 5\' 10"  (1.778 m)   Wt 86.2 kg   SpO2 100%   BMI 27.26 kg/m   Physical Exam Vitals and nursing note reviewed.  Constitutional:      General: He is not in acute distress.    Appearance: He is well-developed. He is not ill-appearing or diaphoretic.  HENT:     Head: Normocephalic and atraumatic.     Right Ear: External ear normal.     Left Ear: External ear normal.     Nose: Nose normal.  Eyes:     General:        Right eye: No discharge.        Left eye: No discharge.  Cardiovascular:     Rate and Rhythm: Normal rate and regular rhythm.     Heart sounds: Normal heart sounds.  Pulmonary:     Effort: Pulmonary effort is normal.     Breath sounds: Normal breath sounds.  Abdominal:     General: There is no distension.     Palpations: Abdomen is soft.     Tenderness: There is no abdominal tenderness.  Musculoskeletal:     Cervical back: Neck supple.  Skin:    General: Skin is warm and dry.  Neurological:     Mental Status: He is alert.  Psychiatric:        Mood and Affect: Mood is not anxious.    ED Results / Procedures / Treatments   Labs (all labs  ordered are listed, but only abnormal results are displayed) Labs Reviewed  URINALYSIS, ROUTINE W REFLEX MICROSCOPIC -  Abnormal; Notable for the following components:      Result Value   Color, Urine COLORLESS (*)    All other components within normal limits  BASIC METABOLIC PANEL - Abnormal; Notable for the following components:   Potassium 3.4 (*)    Glucose, Bld 104 (*)    All other components within normal limits  CBC WITH DIFFERENTIAL/PLATELET - Abnormal; Notable for the following components:   HCT 38.4 (*)    All other components within normal limits    EKG None  Radiology No results found.  Procedures Procedures   Medications Ordered in ED Medications - No data to display  ED Course  I have reviewed the triage vital signs and the nursing notes.  Pertinent labs & imaging results that were available during my care of the patient were reviewed by me and considered in my medical decision making (see chart for details).    MDM Rules/Calculators/A&P                          Patient is concerned that he has recurrent urinary retention.  His labs are reassuring including benign urinalysis and no kidney injury.  Chart has been reviewed and it seems like he had significant kidney injury from a very large bladder of over 2 L.  He is currently on Flomax.  Bladder scan after urinating a decent amount shows around 400 mL.  I discussed with urology, Dr. Alyson Ingles, who states this is probably normal for him given how distended his bladder was before and given he is having decent urine output and good kidney function, we can double up his Flomax and have him follow-up with his urologist in a week or 2.  Patient prefers to hold off on any type of catheter at this time and appears stable for discharge home with return precautions. Final Clinical Impression(s) / ED Diagnoses Final diagnoses:  Urinary retention with incomplete bladder emptying    Rx / DC Orders ED Discharge Orders           Ordered    tamsulosin (FLOMAX) 0.4 MG CAPS capsule  Daily        11/01/20 2233             Sherwood Gambler, MD 11/02/20 0001

## 2020-11-01 NOTE — ED Notes (Signed)
After patient voided, the bladder scan showed 299 mL.

## 2020-11-01 NOTE — ED Notes (Signed)
Bladder scan 403 ml

## 2020-11-01 NOTE — ED Notes (Signed)
ED Provider at bedside. 

## 2020-11-01 NOTE — ED Notes (Signed)
Bladder Scan: 469 mL

## 2020-11-03 DIAGNOSIS — R338 Other retention of urine: Secondary | ICD-10-CM | POA: Diagnosis not present

## 2020-11-05 ENCOUNTER — Encounter: Payer: Self-pay | Admitting: Surgery

## 2020-11-05 ENCOUNTER — Encounter: Payer: Self-pay | Admitting: Family Medicine

## 2020-11-05 ENCOUNTER — Ambulatory Visit (INDEPENDENT_AMBULATORY_CARE_PROVIDER_SITE_OTHER): Payer: BC Managed Care – PPO | Admitting: Surgery

## 2020-11-05 ENCOUNTER — Other Ambulatory Visit: Payer: Self-pay

## 2020-11-05 VITALS — BP 128/75 | HR 62 | Temp 98.0°F | Ht 70.0 in | Wt 190.6 lb

## 2020-11-05 DIAGNOSIS — Z09 Encounter for follow-up examination after completed treatment for conditions other than malignant neoplasm: Secondary | ICD-10-CM

## 2020-11-05 DIAGNOSIS — K429 Umbilical hernia without obstruction or gangrene: Secondary | ICD-10-CM

## 2020-11-05 NOTE — Patient Instructions (Addendum)
If you have any concerns or questions, please feel free to call our office.   GENERAL POST-OPERATIVE PATIENT INSTRUCTIONS   WOUND CARE INSTRUCTIONS:  Keep a dry clean dressing on the wound if there is drainage. The initial bandage may be removed after 24 hours.  Once the wound has quit draining you may leave it open to air.  If clothing rubs against the wound or causes irritation and the wound is not draining you may cover it with a dry dressing during the daytime.  Try to keep the wound dry and avoid ointments on the wound unless directed to do so.  If the wound becomes bright red and painful or starts to drain infected material that is not clear, please contact your physician immediately.  If the wound is mildly pink and has a thick firm ridge underneath it, this is normal, and is referred to as a healing ridge.  This will resolve over the next 4-6 weeks.  BATHING: You may shower if you have been informed of this by your surgeon. However, Please do not submerge in a tub, hot tub, or pool until incisions are completely sealed or have been told by your surgeon that you may do so.  DIET:  You may eat any foods that you can tolerate.  It is a good idea to eat a high fiber diet and take in plenty of fluids to prevent constipation.  If you do become constipated you may want to take a mild laxative or take ducolax tablets on a daily basis until your bowel habits are regular.  Constipation can be very uncomfortable, along with straining, after recent surgery.  ACTIVITY:  You are encouraged to cough and deep breath or use your incentive spirometer if you were given one, every 15-30 minutes when awake.  This will help prevent respiratory complications and low grade fevers post-operatively if you had a general anesthetic.  You may want to hug a pillow when coughing and sneezing to add additional support to the surgical area, if you had abdominal or chest surgery, which will decrease pain during these times.  You  are encouraged to walk and engage in light activity for the next two weeks. You should not lift. push, or pull more than 20 pounds, until 11/27/2020 as it could put you at increased risk for complications.  Twenty pounds is roughly equivalent to a plastic bag of groceries. At that time- Listen to your body when lifting, if you have pain when lifting, stop and then try again in a few days. Soreness after doing exercises or activities of daily living is normal as you get back in to your normal routine.  MEDICATIONS:  Try to take narcotic medications and anti-inflammatory medications, such as tylenol, ibuprofen, naprosyn, etc., with food.  This will minimize stomach upset from the medication.  Should you develop nausea and vomiting from the pain medication, or develop a rash, please discontinue the medication and contact your physician.  You should not drive, make important decisions, or operate machinery when taking narcotic pain medication.  SUNBLOCK Use sun block to incision area over the next year if this area will be exposed to sun. This helps decrease scarring and will allow you avoid a permanent darkened area over your incision.      Acute Urinary Retention, Male  Acute urinary retention is when a person cannot pee (urinate) at all, or can only pee a little. This can come on all of a sudden. If it isnot treated, it  can lead to kidney problems or other serious problems. What are the causes? A problem with the tube that drains the bladder (urethra). Problems with the nerves in the bladder. Tumors. Certain medicines. An infection. Having trouble pooping (constipation). What increases the risk? Older men are more at risk because their prostate gland may become larger as they age. Other conditions also can increase risk. These include: Diseases, such as multiple sclerosis. Injury to the spinal cord. Diabetes. A condition that affects the way the brain works, such as dementia. Holding back  urine due to trauma or because you do not want to use the bathroom. What are the signs or symptoms? Trouble peeing. Pain in the lower belly. How is this treated? Treatment for this condition may include: Medicines. Placing a thin, germ-free tube (catheter) into the bladder to drain pee out of the body. Therapy to treat mental health conditions. Treatment for conditions that may cause this. If needed, you may be treated in the hospital for kidney problems or to manageother problems. Follow these instructions at home: Medicines Take over-the-counter and prescription medicines only as told by your doctor. Ask your doctor what medicines you should stay away from. If you were given an antibiotic medicine, take it as told by your doctor. Do not stop taking it, even if you start to feel better. General instructions Do not smoke or use any products that contain nicotine or tobacco. If you need help quitting, ask your doctor. Drink enough fluid to keep your pee pale yellow. If you were sent home with a tube that drains the bladder, take care of it as told by your doctor. Watch for changes in your symptoms. Tell your doctor about them. If told, keep track of changes in your blood pressure at home. Tell your doctor about them. Keep all follow-up visits. Contact a doctor if: You have spasms in your bladder that you cannot stop. You leak pee when you have spasms. Get help right away if: You have chills or a fever. You have blood in your pee. You have a tube that drains pee from the bladder and these things happen: The tube stops draining pee. The tube falls out. Summary Acute urinary retention is when you cannot pee at all or you pee too little. If this condition is not treated, it can lead to kidney problems or other serious problems. If you were sent home with a tube (catheter) that drains the bladder, take care of it as told by your doctor. Watch for changes in your symptoms. Tell your  doctor about them. This information is not intended to replace advice given to you by your health care provider. Make sure you discuss any questions you have with your healthcare provider. Document Revised: 01/23/2020 Document Reviewed: 01/23/2020 Elsevier Patient Education  2022 [employer].

## 2020-11-05 NOTE — Progress Notes (Signed)
11/05/2020  HPI: Randy [name] is a 55 y.o. male s/p umbilical hernia repair with mesh on 10/16/20.  He had complication of significant constipation and urinary retention which required Foley catheter placement and admission to the hospital between 6/8 and 10/24/20.  Today, denies any nausea, vomiting, chills.  Having normal bowel movements and flatus.  He did present to ED again on 6/18 for concerns of decreased urine output, and had a bladder scan showing around 400 ml.  After discussing with Urology, the ED discharged him home and he has a follow up with his urologist tomorrow.  Given that his bladder was so distended initially with about 3 liters of urine, it was thought that it would take a while for his bladder function to return back to normal and thus 400 ml in bladder scan was not worrisome.    Vital signs: BP 128/75   Pulse 62   Temp 98 F (36.7 C) (Oral)   Ht 5\' 10"  (1.778 m)   Wt 190 lb 9.6 oz (86.5 kg)   SpO2 98%   BMI 27.35 kg/m    Physical Exam: Constitutional: No acute distress Abdomen:  soft, non-distended, non-tender to palpation.  Umbilical incision is clean, dry, intact, with some dermabond still in place, though peeling off. No evidence of infection or hernia recurrence.  Assessment/Plan: This is a 55 y.o. male s/p umbilical hernia repair  --patient had significant complication of urinary retention with AKI and constipation which required hospital admission.  He recovered quickly and now has normal bowel function, had normal kidney function prior to discharge, and his foley catheter is out.  His bladder is not back to full function and he has appointment with his urologist tomorrow. --Patient expressed frustration with the on-call answering service, as he had called prior to his hospital admission and mentioned that he had not voided and was having constipation, and he was instructed to drink more fluid and to use laxatives.  He feels that this could have been caught much  earlier.  I will discuss with our team here to emphasize better education for our answering service. --From surgical standpoint, his incision is healing well without complication.  Patient had questions about the use of mesh in his surgery, and reassured him that the risk of mesh infection is very low, and currently there's no evidence of any complications with the incision and no hernia recurrence.  Mesh was used because of his very active lifestyle, to reduce the risk of hernia recurrence. --Follow up as needed.   Melvyn Neth, [facility]

## 2020-11-06 DIAGNOSIS — R338 Other retention of urine: Secondary | ICD-10-CM | POA: Diagnosis not present

## 2020-11-07 ENCOUNTER — Encounter: Payer: Self-pay | Admitting: Family Medicine

## 2020-11-07 ENCOUNTER — Other Ambulatory Visit: Payer: Self-pay

## 2020-11-07 ENCOUNTER — Ambulatory Visit: Payer: BC Managed Care – PPO | Admitting: Family Medicine

## 2020-11-07 VITALS — BP 112/76 | HR 61 | Temp 97.0°F | Ht 71.0 in | Wt 190.0 lb

## 2020-11-07 DIAGNOSIS — Z131 Encounter for screening for diabetes mellitus: Secondary | ICD-10-CM | POA: Diagnosis not present

## 2020-11-07 LAB — POCT GLYCOSYLATED HEMOGLOBIN (HGB A1C): Hemoglobin A1C: 5.5 % (ref 4.0–5.6)

## 2020-11-07 NOTE — Progress Notes (Signed)
   Subjective:    Patient ID: Randy [name], male    DOB: 1965/06/24, 55 y.o.   MRN: [id number]  HPI He is here for consult concerning recent blood work which did show slightly elevated blood sugar and therefore risk for diabetes.   Review of Systems     Objective:   Physical Exam Exam of his record does show other blood sugars being in the normal range and this 1 being slightly above 100. Hemoglobin A1c today is 5.5.       Assessment & Plan:  Screening for diabetes mellitus - Plan: POCT glycosylated hemoglobin (Hb A1C) I explained that at this point there is no evidence of diabetes however we will continue to monitor this with yearly blood work.

## 2020-11-14 ENCOUNTER — Other Ambulatory Visit: Payer: Self-pay | Admitting: Urology

## 2020-11-19 ENCOUNTER — Encounter: Payer: Self-pay | Admitting: Surgery

## 2020-12-25 DIAGNOSIS — N401 Enlarged prostate with lower urinary tract symptoms: Secondary | ICD-10-CM | POA: Diagnosis not present

## 2020-12-25 DIAGNOSIS — R351 Nocturia: Secondary | ICD-10-CM | POA: Diagnosis not present

## 2021-01-02 ENCOUNTER — Ambulatory Visit (INDEPENDENT_AMBULATORY_CARE_PROVIDER_SITE_OTHER): Payer: BC Managed Care – PPO | Admitting: Surgery

## 2021-01-02 ENCOUNTER — Other Ambulatory Visit: Payer: Self-pay

## 2021-01-02 ENCOUNTER — Encounter: Payer: Self-pay | Admitting: Surgery

## 2021-01-02 VITALS — BP 135/82 | HR 61 | Temp 98.0°F | Ht 70.0 in | Wt 194.2 lb

## 2021-01-02 DIAGNOSIS — Z09 Encounter for follow-up examination after completed treatment for conditions other than malignant neoplasm: Secondary | ICD-10-CM

## 2021-01-02 DIAGNOSIS — K429 Umbilical hernia without obstruction or gangrene: Secondary | ICD-10-CM

## 2021-01-02 NOTE — Patient Instructions (Signed)
Please call if you have any questions or concerns.  °

## 2021-01-02 NOTE — Progress Notes (Signed)
01/02/2021  HPI: Randy [name] is a 55 y.o. male s/p open umbilical hernia repair with mesh on 10/16/2020.  Patient suffered from postop urinary retention and constipation.  He presents today for follow-up.  Overall patient reports that he has not had any further issues with voiding or constipation.  He has now resumed his physical exercise and activity but does feel that there is been some discomfort in the periumbilical area now that he has become more active.  Denies any drainage or issues with the incision.  Vital signs: BP 135/82   Pulse 61   Temp 98 F (36.7 C) (Oral)   Ht '5\' 10"'$  (1.778 m)   Wt 194 lb 3.2 oz (88.1 kg)   SpO2 99%   BMI 27.86 kg/m    Physical Exam: Constitutional: No acute distress Abdomen: Soft, nondistended, currently nontender to palpation.  Umbilical incision is well-healed and is clean, dry, intact.  There is no evidence of any hernia recurrence.  He does have a area of firmness at the base of the umbilicus which is consistent with the sutures and scar tissue.  Assessment/Plan: This is a 55 y.o. male s/p open umbilical hernia repair with mesh.  - Patient is currently healing well.  Discussed with him that the discomfort that he is experiencing is most likely as result of now decreased activity level that he is doing.  Probably what he is feeling is some tugging or pulling on the mesh while everything continues to heal.  I think this is normal and will continue to improve.  There is no evidence of any hernia recurrence and the incision is well-healed. - Follow-up as needed.   Melvyn Neth, [facility]

## 2021-02-06 DIAGNOSIS — R351 Nocturia: Secondary | ICD-10-CM | POA: Diagnosis not present

## 2021-02-06 DIAGNOSIS — Z125 Encounter for screening for malignant neoplasm of prostate: Secondary | ICD-10-CM | POA: Diagnosis not present

## 2021-02-06 DIAGNOSIS — R338 Other retention of urine: Secondary | ICD-10-CM | POA: Diagnosis not present

## 2021-02-06 DIAGNOSIS — N401 Enlarged prostate with lower urinary tract symptoms: Secondary | ICD-10-CM | POA: Diagnosis not present

## 2021-02-06 LAB — PSA: PSA: 0.24

## 2021-04-24 ENCOUNTER — Ambulatory Visit (INDEPENDENT_AMBULATORY_CARE_PROVIDER_SITE_OTHER): Payer: BC Managed Care – PPO

## 2021-04-24 DIAGNOSIS — Z23 Encounter for immunization: Secondary | ICD-10-CM

## 2021-05-29 ENCOUNTER — Other Ambulatory Visit: Payer: Self-pay | Admitting: Family Medicine

## 2021-05-29 DIAGNOSIS — E785 Hyperlipidemia, unspecified: Secondary | ICD-10-CM

## 2021-06-13 IMAGING — CT CT CARDIAC CORONARY ARTERY CALCIUM SCORE
3 series · 14 of 20 positions shown, 15 images · non-contrast
Comparison: None.
COMPARISON: None.

Addendum:
EXAM:
OVER-READ INTERPRETATION  CT CHEST

The following report is an over-read performed by radiologist Dr.
Xosni Abarah [REDACTED] on 10/21/2020. This
over-read does not include interpretation of cardiac or coronary
anatomy or pathology. The coronary calcium score interpretation by
the cardiologist is attached.
CLINICAL DATA: Cardiovascular Disease Risk stratification
Coronary Calcium Score
TECHNIQUE: A gated, non-contrast computed tomography scan of the heart was
performed using 3mm slice thickness. Axial images were analyzed on a
dedicated workstation. Calcium scoring of the coronary arteries was
performed using the Agatston method.

[Series 2: casc 3.0 bv41 2 bestdiast 69 % · axial · 0.48mm/px · z∈[-256,-178]mm · 4 of 44 slices shown, 5 images]
[im 9/44  vessel]
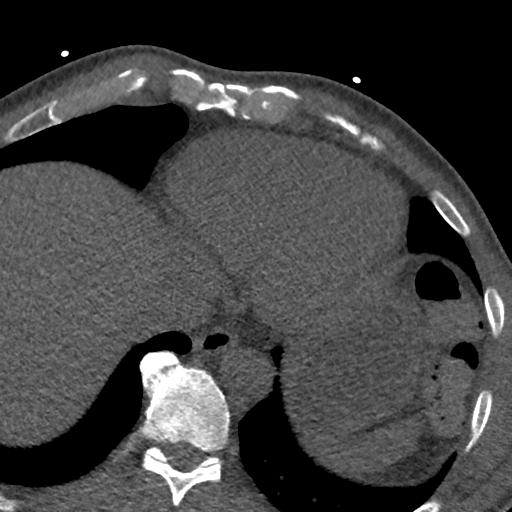
[im 9/44  lung]
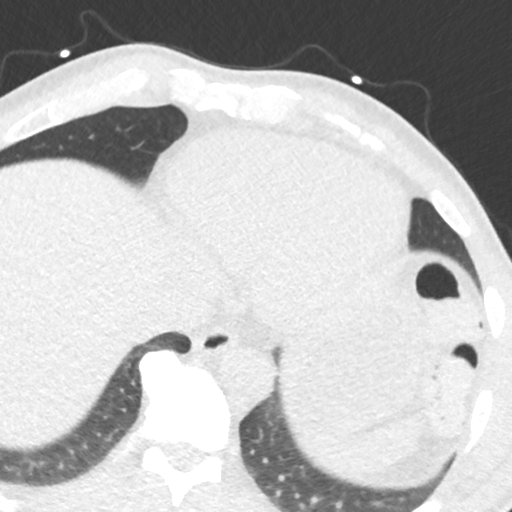
[im 18/44  vessel]
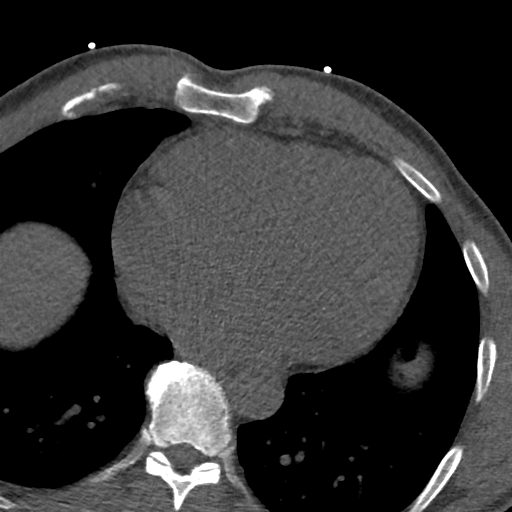
[im 26/44  vessel]
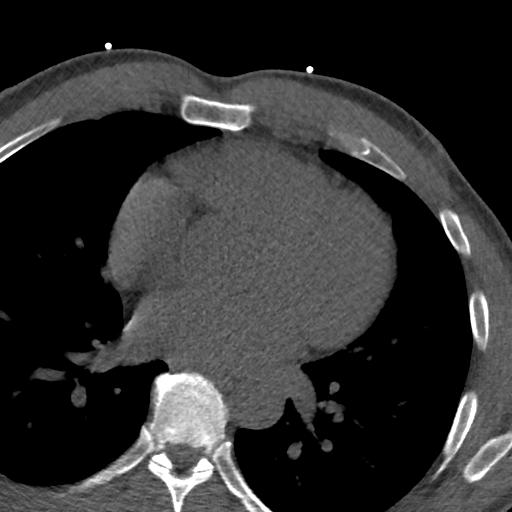
[im 35/44  vessel]
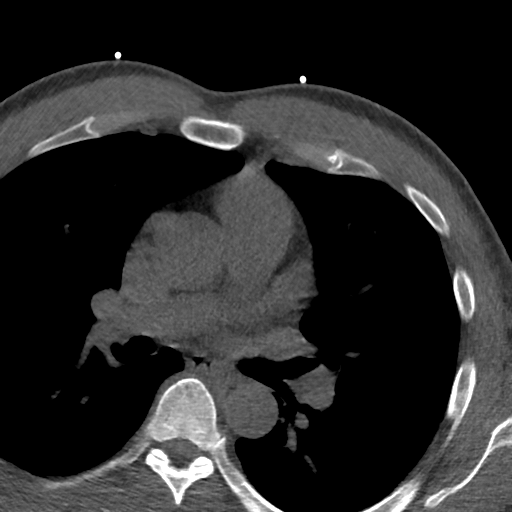

[Series 3: lung 69 % · axial · 0.71mm/px · z∈[-259,-175]mm · 5 of 44 slices shown]
[im 8/44  lung]
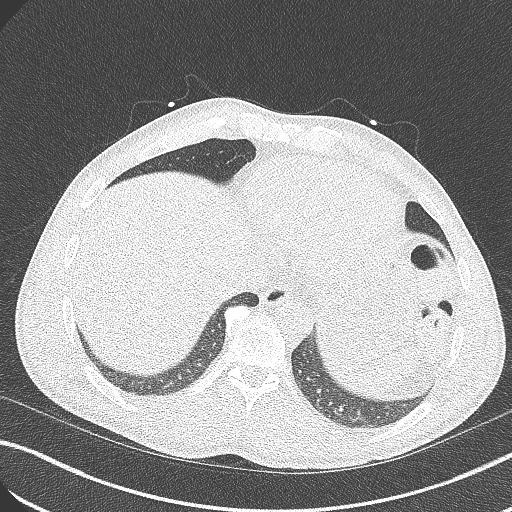
[im 15/44  lung]
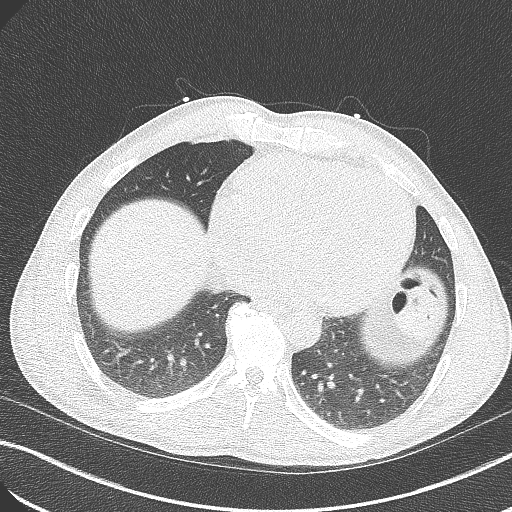
[im 22/44  lung]
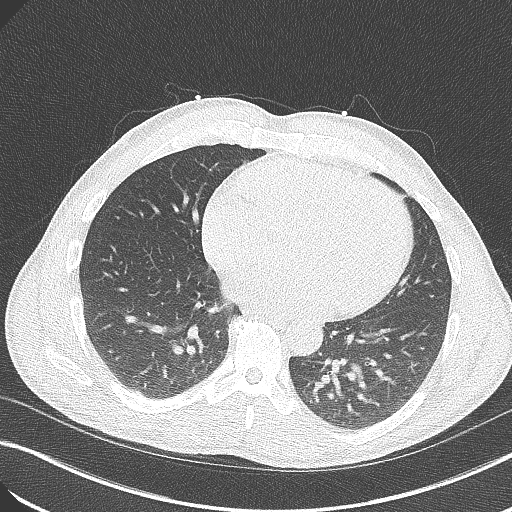
[im 29/44  lung]
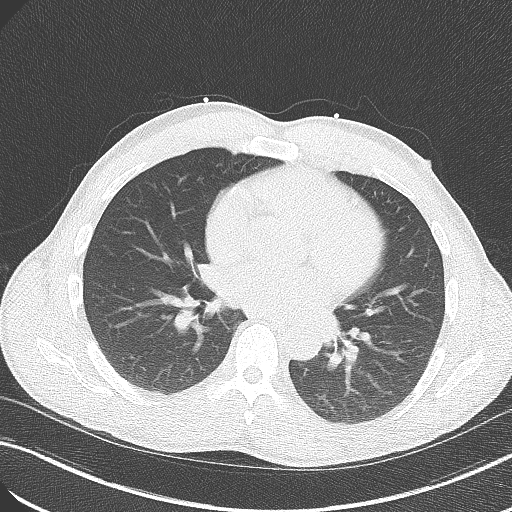
[im 36/44  lung]
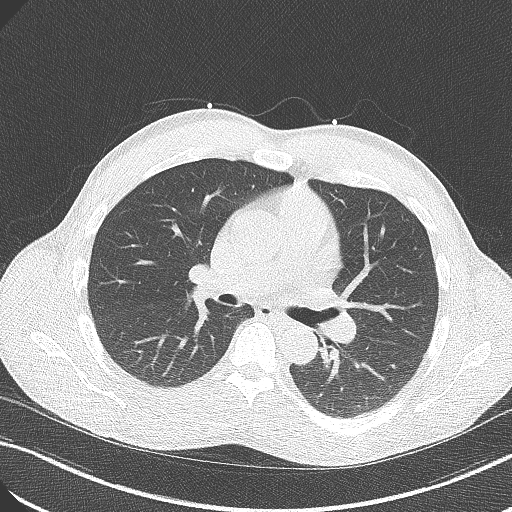

[Series 4: lung st 69 % · axial · 0.71mm/px · z∈[-259,-175]mm · 5 of 44 slices shown]
[im 8/44  lung]
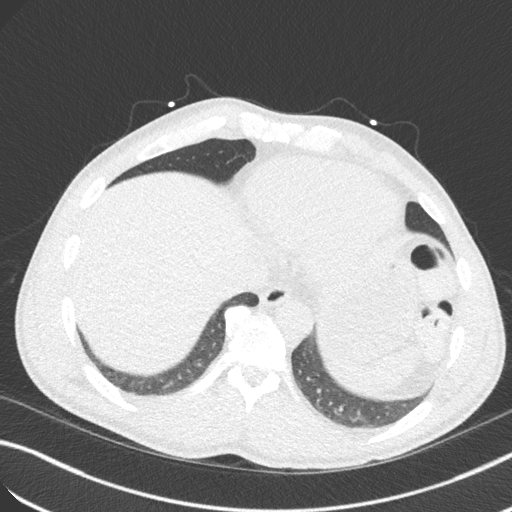
[im 15/44  lung]
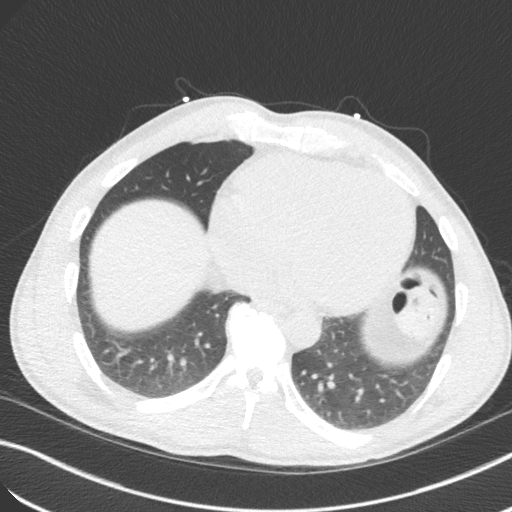
[im 22/44  lung]
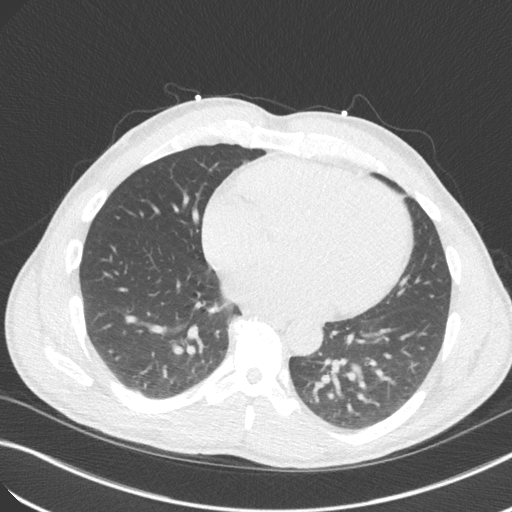
[im 29/44  lung]
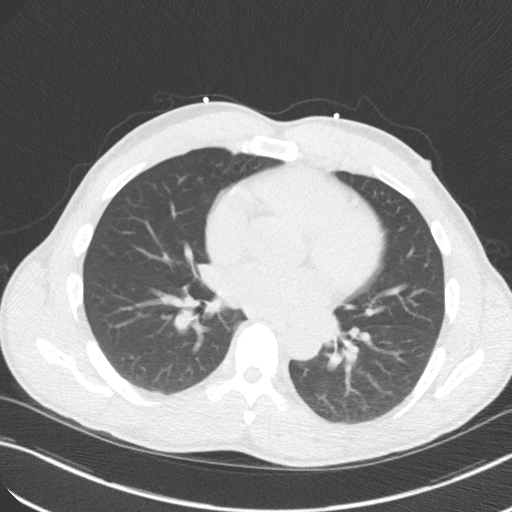
[im 36/44  lung]
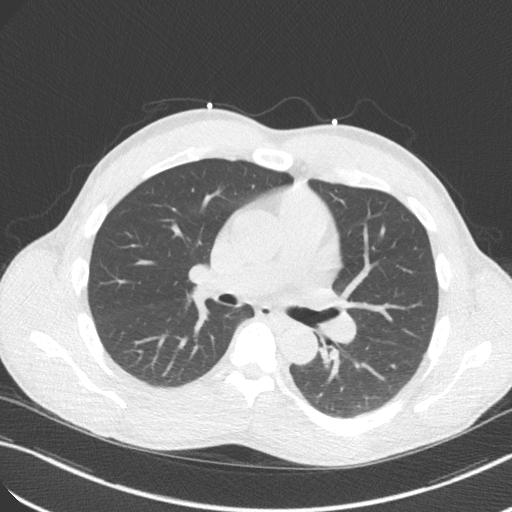

[14 of 20 positions shown; findings below may reference images not displayed]

FINDINGS: Within the visualized portions of the thorax there are no suspicious
appearing pulmonary nodules or masses, there is no acute
consolidative airspace disease, no pleural effusions, no
pneumothorax and no lymphadenopathy. Visualized portions of the
upper abdomen are unremarkable. There are no aggressive appearing
lytic or blastic lesions noted in the visualized portions of the
skeleton.
IMPRESSION: 1. No significant incidental noncardiac findings are noted.
FINDINGS: Coronary arteries: Normal origins.

Coronary Calcium Score:

Left main: 0

Left anterior descending artery:

Left circumflex artery:

Right coronary artery: 0

Total:

Percentile: 53rd

Pericardium: Normal.

Ascending Aorta: Normal caliber.

Non-cardiac: See separate report from [REDACTED].
IMPRESSION: Coronary calcium score of 9.55. This was 53rd percentile for age-,
race-, and sex-matched controls.



If CAC=0, it is reasonable to withhold statin therapy and reassess
in 5 to 10 years, as long as higher risk conditions are absent
(diabetes mellitus, family history of premature CHD in first degree
relatives (males <55 years; females <65 years), cigarette smoking,
or LDL >=190 mg/dL).

If CAC is 1 to 99, it is reasonable to initiate statin therapy for
patients >=55 years of age.

If CAC is >=100 or >=75th percentile, it is reasonable to initiate
statin therapy at any age.

Cardiology referral should be considered for patients with CAC
scores >=400 or >=75th percentile.

*8981 AHA/ACC/AACVPR/AAPA/ABC/EGGERS/BRADY/MORIAKI/[place]/SADIQ/AJRA/IMEKBKSW
Guideline on the Management of Blood Cholesterol: A Report of the
American College of Cardiology/American Heart Association Task Force
on Clinical Practice Guidelines. J Am Coll Cardiol.
6465;73(24):0649-0764.

*** End of Addendum ***
EXAM:
OVER-READ INTERPRETATION  CT CHEST

The following report is an over-read performed by radiologist Dr.
Xosni Abarah [REDACTED] on 10/21/2020. This
over-read does not include interpretation of cardiac or coronary
anatomy or pathology. The coronary calcium score interpretation by
the cardiologist is attached.
FINDINGS: Within the visualized portions of the thorax there are no suspicious
appearing pulmonary nodules or masses, there is no acute
consolidative airspace disease, no pleural effusions, no
pneumothorax and no lymphadenopathy. Visualized portions of the
upper abdomen are unremarkable. There are no aggressive appearing
lytic or blastic lesions noted in the visualized portions of the
skeleton.
IMPRESSION: 1. No significant incidental noncardiac findings are noted.

## 2021-06-14 IMAGING — US US EXTREM LOW VENOUS
1 series · 13 of 24 positions shown · non-contrast
Comparison: Bilateral lower extremity venous Doppler
ultrasound-03/14/2012

CLINICAL DATA: Bilateral lower extremity pain and edema for the
past 3 days. Evaluate for DVT.



[Series 1: us venous img lower bilat (dvt) · portal-venous · 13 of 60 slices shown]
[im 1/60]
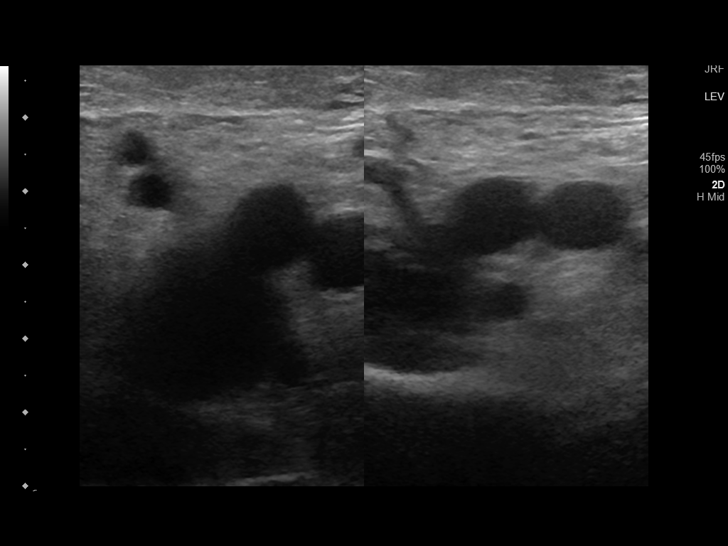
[im 6/60]
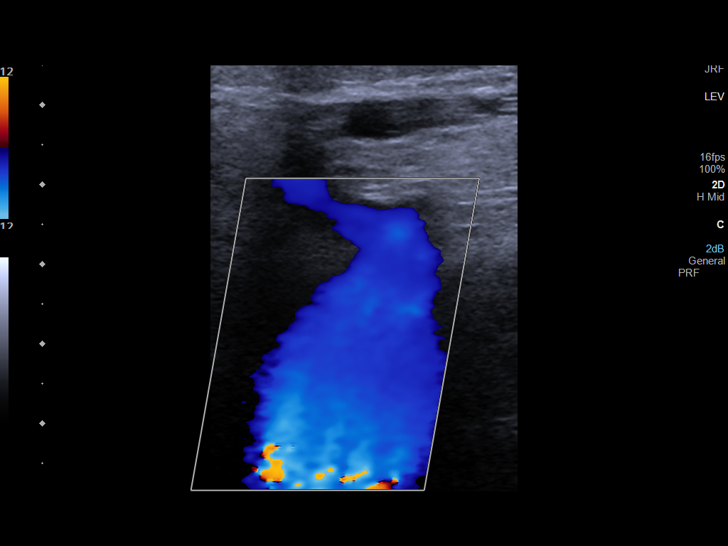
[im 11/60]
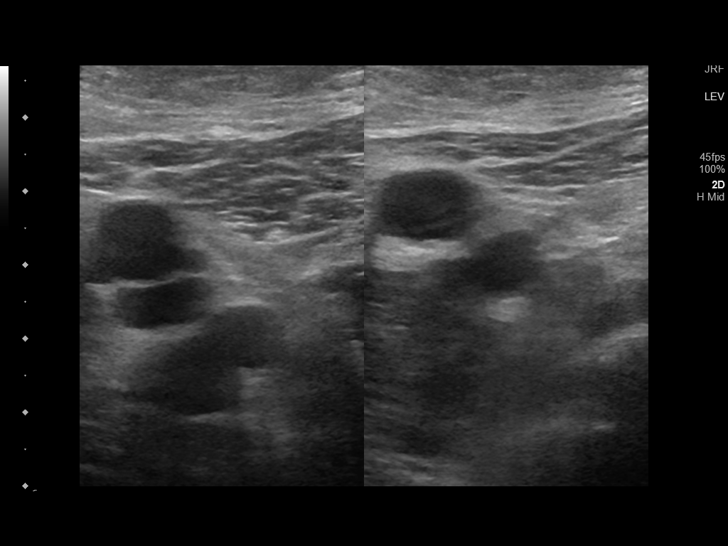
[im 16/60]
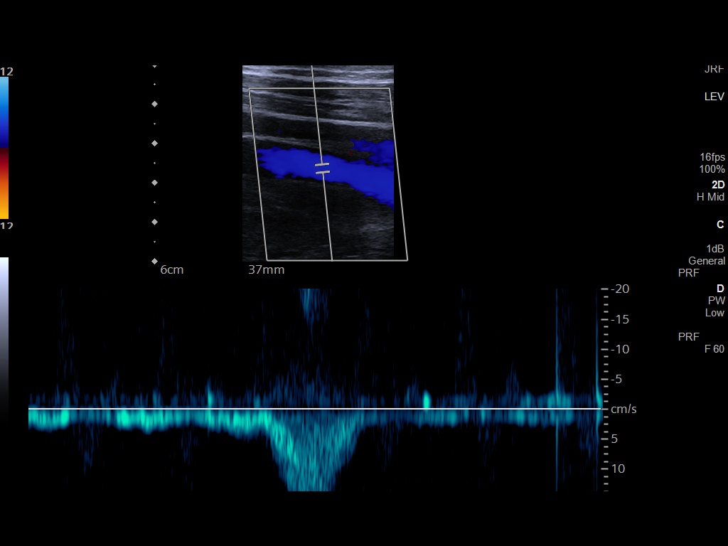
[im 21/60]
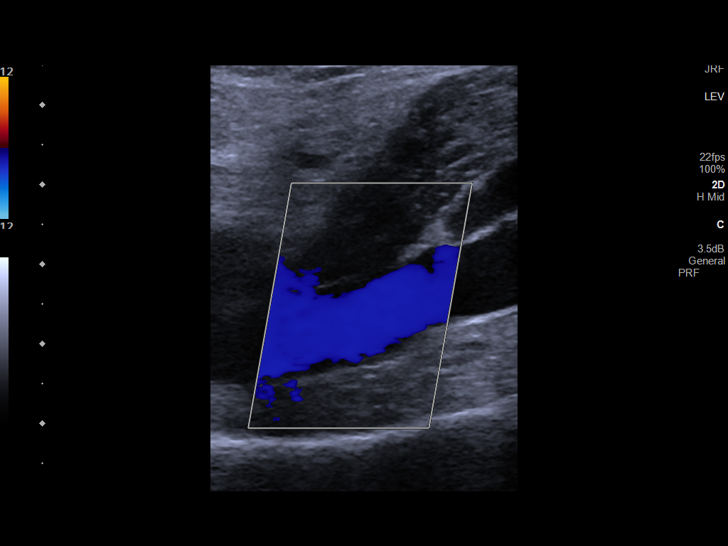
[im 26/60]
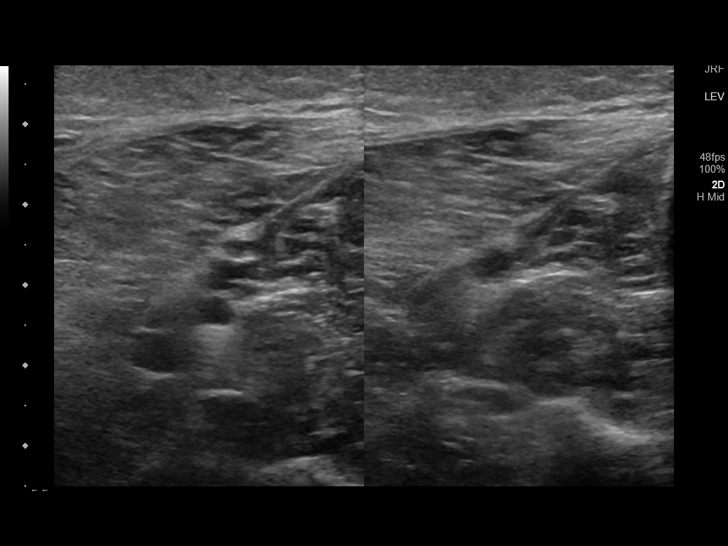
[im 31/60]
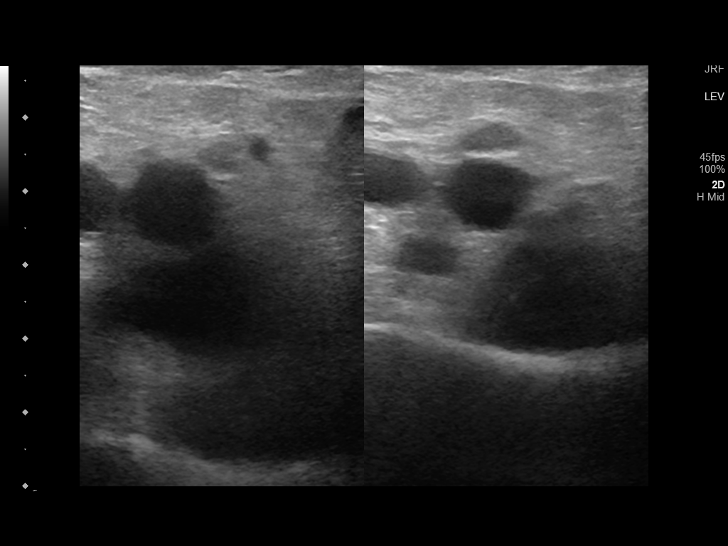
[im 34/60]
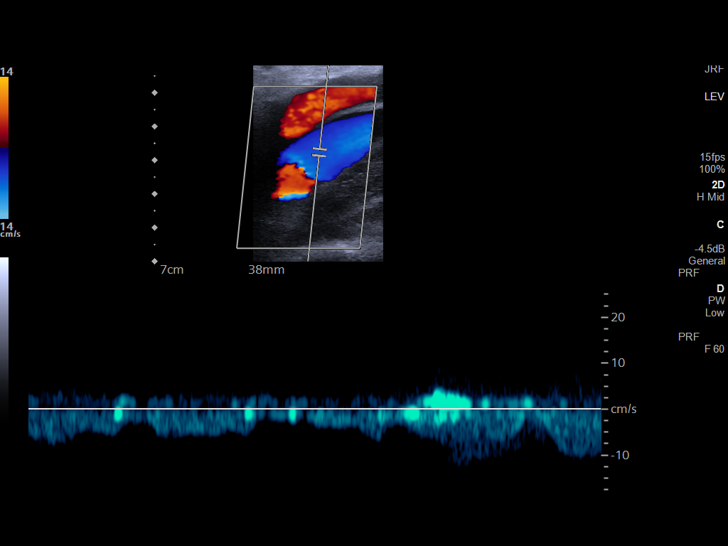
[im 39/60]
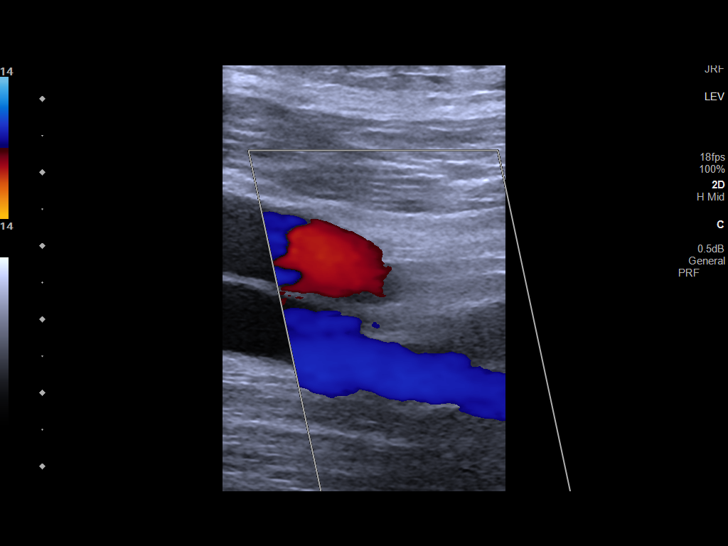
[im 44/60]
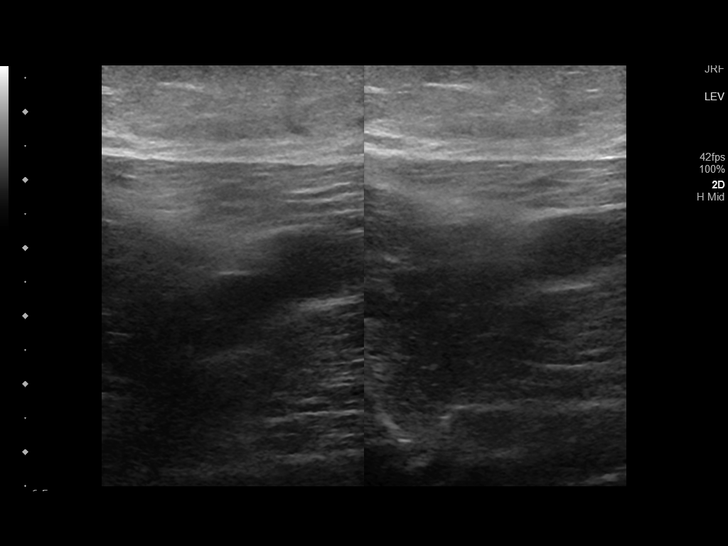
[im 49/60]
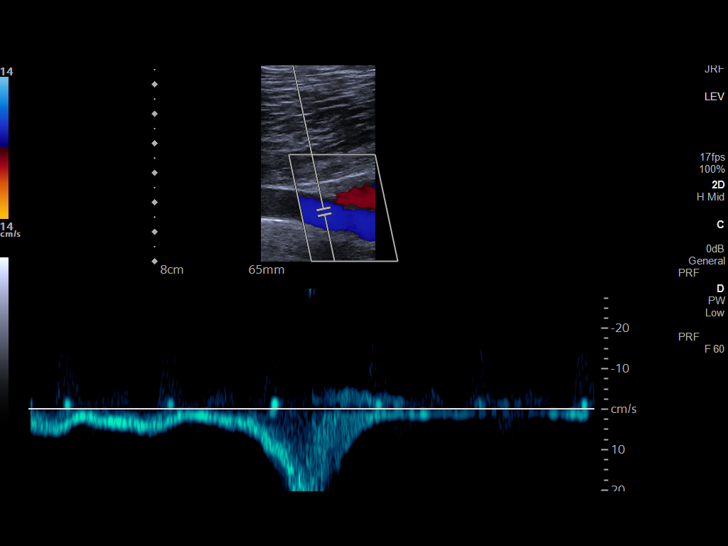
[im 54/60]
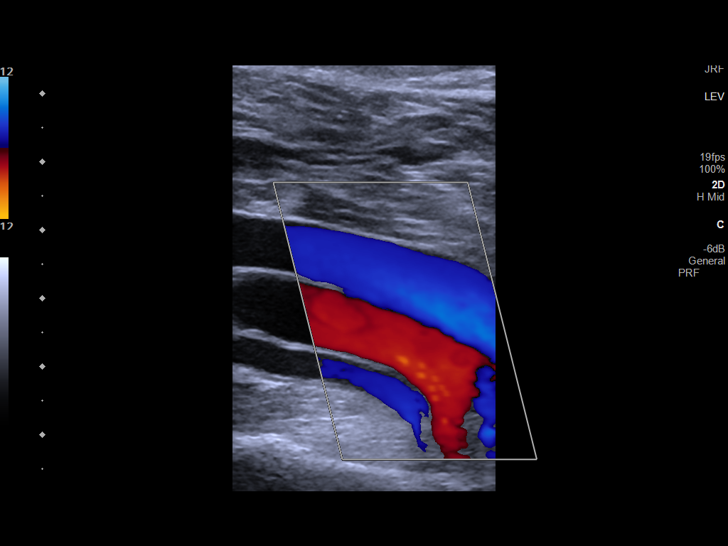
[im 60/60]
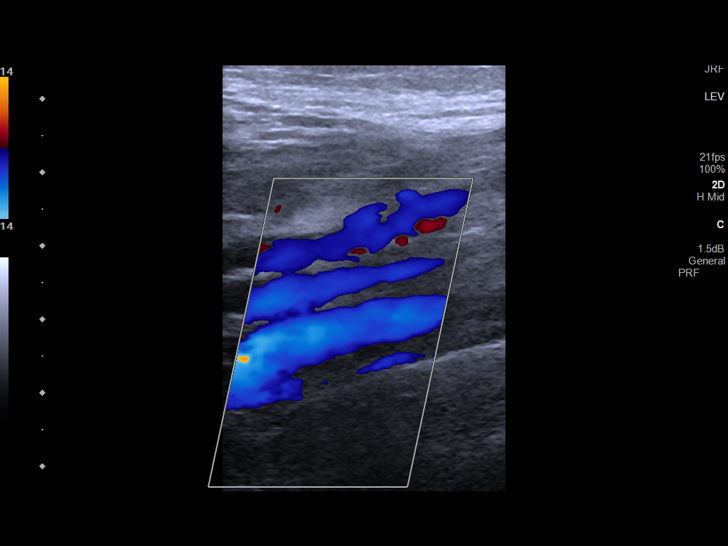

[13 of 24 positions shown; findings below may reference images not displayed]

FINDINGS: RIGHT LOWER EXTREMITY

Common Femoral Vein: No evidence of thrombus. Normal
compressibility, respiratory phasicity and response to augmentation.

Saphenofemoral Junction: No evidence of thrombus. Normal
compressibility and flow on color Doppler imaging.

Profunda Femoral Vein: No evidence of thrombus. Normal
compressibility and flow on color Doppler imaging.

Femoral Vein: No evidence of thrombus. Normal compressibility,
respiratory phasicity and response to augmentation.

Popliteal Vein: No evidence of thrombus. Normal compressibility,
respiratory phasicity and response to augmentation.

Calf Veins: No evidence of thrombus. Normal compressibility and flow
on color Doppler imaging.

Superficial Great Saphenous Vein: No evidence of thrombus. Normal
compressibility.

Venous Reflux:  None.

Other Findings:  None.

LEFT LOWER EXTREMITY

Common Femoral Vein: No evidence of thrombus. Normal
compressibility, respiratory phasicity and response to augmentation.

Saphenofemoral Junction: No evidence of thrombus. Normal
compressibility and flow on color Doppler imaging.

Profunda Femoral Vein: No evidence of thrombus. Normal
compressibility and flow on color Doppler imaging.

Femoral Vein: No evidence of thrombus. Normal compressibility,
respiratory phasicity and response to augmentation.

Popliteal Vein: No evidence of thrombus. Normal compressibility,
respiratory phasicity and response to augmentation.

Calf Veins: No evidence of thrombus. Normal compressibility and flow
on color Doppler imaging.

Superficial Great Saphenous Vein: No evidence of thrombus. Normal
compressibility.

Venous Reflux:  None.

Other Findings:  None.
IMPRESSION: No evidence of DVT within either lower extremity.

## 2021-06-26 ENCOUNTER — Other Ambulatory Visit: Payer: Self-pay

## 2021-06-26 ENCOUNTER — Encounter: Payer: Self-pay | Admitting: Family Medicine

## 2021-06-26 ENCOUNTER — Ambulatory Visit (INDEPENDENT_AMBULATORY_CARE_PROVIDER_SITE_OTHER): Payer: BC Managed Care – PPO | Admitting: Family Medicine

## 2021-06-26 VITALS — BP 108/70 | HR 57 | Temp 97.4°F | Wt 200.2 lb

## 2021-06-26 DIAGNOSIS — E785 Hyperlipidemia, unspecified: Secondary | ICD-10-CM

## 2021-06-26 DIAGNOSIS — E663 Overweight: Secondary | ICD-10-CM

## 2021-06-26 DIAGNOSIS — K219 Gastro-esophageal reflux disease without esophagitis: Secondary | ICD-10-CM

## 2021-06-26 DIAGNOSIS — K5909 Other constipation: Secondary | ICD-10-CM

## 2021-06-26 DIAGNOSIS — J301 Allergic rhinitis due to pollen: Secondary | ICD-10-CM

## 2021-06-26 DIAGNOSIS — Z9889 Other specified postprocedural states: Secondary | ICD-10-CM

## 2021-06-26 DIAGNOSIS — I10 Essential (primary) hypertension: Secondary | ICD-10-CM | POA: Diagnosis not present

## 2021-06-26 DIAGNOSIS — Z0000 Encounter for general adult medical examination without abnormal findings: Secondary | ICD-10-CM

## 2021-06-26 DIAGNOSIS — Z8719 Personal history of other diseases of the digestive system: Secondary | ICD-10-CM

## 2021-06-26 DIAGNOSIS — Z8249 Family history of ischemic heart disease and other diseases of the circulatory system: Secondary | ICD-10-CM

## 2021-06-26 DIAGNOSIS — N400 Enlarged prostate without lower urinary tract symptoms: Secondary | ICD-10-CM

## 2021-06-26 DIAGNOSIS — L409 Psoriasis, unspecified: Secondary | ICD-10-CM | POA: Diagnosis not present

## 2021-06-26 LAB — POCT URINALYSIS DIP (PROADVANTAGE DEVICE)
Bilirubin, UA: NEGATIVE
Blood, UA: NEGATIVE
Glucose, UA: NEGATIVE mg/dL
Ketones, POC UA: NEGATIVE mg/dL
Leukocytes, UA: NEGATIVE
Nitrite, UA: NEGATIVE
Protein Ur, POC: NEGATIVE mg/dL
Specific Gravity, Urine: 1.01
Urobilinogen, Ur: 0.2
pH, UA: 7.5 (ref 5.0–8.0)

## 2021-06-26 LAB — CBC WITH DIFFERENTIAL/PLATELET
Basophils Absolute: 0.1 10*3/uL (ref 0.0–0.2)
Basos: 1 %
EOS (ABSOLUTE): 0.1 10*3/uL (ref 0.0–0.4)
Eos: 1 %
Hematocrit: 42.4 % (ref 37.5–51.0)
Hemoglobin: 14.4 g/dL (ref 13.0–17.7)
Immature Grans (Abs): 0 10*3/uL (ref 0.0–0.1)
Immature Granulocytes: 0 %
Lymphocytes Absolute: 1.9 10*3/uL (ref 0.7–3.1)
Lymphs: 24 %
MCH: 30.4 pg (ref 26.6–33.0)
MCHC: 34 g/dL (ref 31.5–35.7)
MCV: 90 fL (ref 79–97)
Monocytes Absolute: 0.4 10*3/uL (ref 0.1–0.9)
Monocytes: 5 %
Neutrophils Absolute: 5.6 10*3/uL (ref 1.4–7.0)
Neutrophils: 69 %
Platelets: 261 10*3/uL (ref 150–450)
RBC: 4.73 x10E6/uL (ref 4.14–5.80)
RDW: 12.6 % (ref 11.6–15.4)
WBC: 8 10*3/uL (ref 3.4–10.8)

## 2021-06-26 LAB — COMPREHENSIVE METABOLIC PANEL
ALT: 43 IU/L (ref 0–44)
AST: 30 IU/L (ref 0–40)
Albumin/Globulin Ratio: 3.1 — ABNORMAL HIGH (ref 1.2–2.2)
Albumin: 5 g/dL — ABNORMAL HIGH (ref 3.8–4.9)
Alkaline Phosphatase: 51 IU/L (ref 44–121)
BUN/Creatinine Ratio: 13 (ref 9–20)
BUN: 14 mg/dL (ref 6–24)
Bilirubin Total: 0.8 mg/dL (ref 0.0–1.2)
CO2: 24 mmol/L (ref 20–29)
Calcium: 9.5 mg/dL (ref 8.7–10.2)
Chloride: 102 mmol/L (ref 96–106)
Creatinine, Ser: 1.06 mg/dL (ref 0.76–1.27)
Globulin, Total: 1.6 g/dL (ref 1.5–4.5)
Glucose: 100 mg/dL — ABNORMAL HIGH (ref 70–99)
Potassium: 4.5 mmol/L (ref 3.5–5.2)
Sodium: 143 mmol/L (ref 134–144)
Total Protein: 6.6 g/dL (ref 6.0–8.5)
eGFR: 82 mL/min/{1.73_m2} (ref >59)

## 2021-06-26 LAB — LIPID PANEL
Chol/HDL Ratio: 2 ratio (ref 0.0–5.0)
Cholesterol, Total: 128 mg/dL (ref 100–199)
HDL: 63 mg/dL (ref >39)
LDL Chol Calc (NIH): 51 mg/dL (ref 0–99)
Triglycerides: 69 mg/dL (ref 0–149)
VLDL Cholesterol Cal: 14 mg/dL (ref 5–40)

## 2021-06-26 MED ORDER — ROSUVASTATIN CALCIUM 20 MG PO TABS: 20.0000 mg | ORAL_TABLET | Freq: Every day | ORAL | 3 refills | Status: DC

## 2021-06-26 MED ORDER — AMLODIPINE BESYLATE 10 MG PO TABS: 10.0000 mg | ORAL_TABLET | Freq: Every day | ORAL | 3 refills | Status: DC

## 2021-06-26 NOTE — Progress Notes (Signed)
° °  Subjective:    Patient ID: Randy [name], male    DOB: [birth year], 56 y.o.   MRN: [id number]  HPI He is here for complete examination.  He does have a history of umbilical hernia repair and still has some discomfort on occasion.  He would like this checked.  When the repair was done he then had difficulty with acute kidney failure with urinary retention.  He has seen urology for that and was taking Flomax and finasteride.  He is now continuing on half dosing of finasteride.  His allergies seem to be under good control and Claritin.  He continues on amlodipine for his blood pressure.  His weight has come down significantly over the last several years.  He continues on Crestor and is having no difficulty with that medication.  Does have a previous history of shingles.  Also family history of heart disease.  He does have a history of GERD but presently is on no medication for that.  Continues on MiraLAX to help with his constipation with good results.  His psoriasis rarely gives him any difficulty.  Otherwise his family and social history as well as health maintenance and immunizations was reviewed.   Review of Systems  All other systems reviewed and are negative.     Objective:   Physical Exam Alert and in no distress. Tympanic membranes and canals are normal. Pharyngeal area is normal. Neck is supple without adenopathy or thyromegaly. Cardiac exam shows a regular sinus rhythm without murmurs or gallops. Lungs are clear to auscultation.  Abdominal exam shows no masses or tenderness with normal bowel sounds specifically no umbilical lesions Cabbell pain was elicited.        Assessment & Plan:  Essential hypertension - Plan: amLODipine (NORVASC) 10 MG tablet  Seasonal allergic rhinitis due to pollen  Gastroesophageal reflux disease, unspecified whether esophagitis present  Psoriasis  Family history of heart disease in male family member before age 106  Hyperlipidemia LDL goal <100 -  Plan: Lipid panel, rosuvastatin (CRESTOR) 20 MG tablet  Overweight (BMI 25.0-29.9)  Other constipation  Benign prostatic hyperplasia without lower urinary tract symptoms  Routine general medical examination at a health care facility - Plan: CBC with Differential/Platelet, Comprehensive metabolic panel, Lipid panel Continue on his allergy medications as needed as well as MiraLAX for his constipation.  He is now on half dosing of the finasteride and does plan to follow-up with urology.  No therapy needed for the reflux for the psoriasis.  I complemented him on his weight loss.

## 2021-06-26 NOTE — Addendum Note (Signed)
Addended by: Elyse Jarvis on: 06/26/2021 02:09 PM   Modules accepted: Orders

## 2021-08-10 DIAGNOSIS — R361 Hematospermia: Secondary | ICD-10-CM | POA: Diagnosis not present

## 2021-08-10 DIAGNOSIS — R351 Nocturia: Secondary | ICD-10-CM | POA: Diagnosis not present

## 2021-08-10 DIAGNOSIS — N401 Enlarged prostate with lower urinary tract symptoms: Secondary | ICD-10-CM | POA: Diagnosis not present

## 2022-01-20 ENCOUNTER — Encounter: Payer: Self-pay | Admitting: Internal Medicine

## 2022-02-16 ENCOUNTER — Other Ambulatory Visit: Payer: Self-pay | Admitting: Family Medicine

## 2022-02-16 DIAGNOSIS — I10 Essential (primary) hypertension: Secondary | ICD-10-CM

## 2022-03-03 ENCOUNTER — Encounter: Payer: Self-pay | Admitting: Family Medicine

## 2022-03-03 ENCOUNTER — Encounter: Payer: Self-pay | Admitting: Cardiology

## 2022-03-04 NOTE — Progress Notes (Signed)
HPI: FU hypertension. Venous Dopplers in April of 2014 showed venous insufficiency. Lower extremity arterial dopplers in May 2014 normal. Seen by Dr Fletcher Anon in Sept 2014 for venous insuff and initial conservative measures recommended. Venous ablation could be considered if remains symptomatic despite these measures. Stress echocardiogram February 2015 normal.  Calcium score June 2022 9.55 which was 53rd percentile.  Since last seen, he describes bilateral lower extremity edema that is intermittent but slightly worse.  He has some pain that he attributes to pains in his legs as well.  There is no dyspnea, chest pain or syncope.  Current Outpatient Medications  Medication Sig Dispense Refill   amLODipine (NORVASC) 10 MG tablet TAKE 1 TABLET BY MOUTH EVERY DAY 90 tablet 1   aspirin 81 MG tablet Take 81 mg by mouth daily.     finasteride (PROSCAR) 5 MG tablet TAKE 1 TABLET BY MOUTH EVERY DAY 90 tablet 3   ibuprofen (ADVIL) 600 MG tablet Take 1 tablet (600 mg total) by mouth every 8 (eight) hours as needed for moderate pain. 60 tablet 1   loratadine (CLARITIN) 10 MG tablet Take 10 mg by mouth every morning.     Multiple Vitamin (MULTIVITAMIN WITH MINERALS) TABS Take 1 tablet by mouth daily.     Omega-3 Fatty Acids (FISH OIL) 1200 MG CAPS Take 2,400 mg by mouth daily.     rosuvastatin (CRESTOR) 20 MG tablet Take 1 tablet (20 mg total) by mouth daily. 90 tablet 3   acetaminophen (TYLENOL) 500 MG tablet Take 2 tablets (1,000 mg total) by mouth every 6 (six) hours as needed for mild pain. (Patient not taking: Reported on 03/18/2022)     polyethylene glycol (MIRALAX / GLYCOLAX) 17 g packet Take 17 g by mouth 2 (two) times daily. (Patient not taking: Reported on 03/18/2022) 14 each 0   PROAIR RESPICLICK 315 (90 Base) MCG/ACT AEPB INHALE 2 PUFFS INTO THE LUNGS 4 (FOUR) TIMES DAILY AS NEEDED. (Patient not taking: Reported on 03/18/2022) 1 each 0   No current facility-administered medications for this visit.      Past Medical History:  Diagnosis Date   Family history of ischemic heart disease    Febrile seizure ([place])    AS A CHILD   GERD (gastroesophageal reflux disease)    RARE- NO MEDS   History of hiatal hernia    SMALL   Hypertension    Obesity    Psoriasis    Seasonal allergies    Venous insufficiency     Past Surgical History:  Procedure Laterality Date   CYSTECTOMY     left chest   superficial vein removed Bilateral    LEGS   TOE SURGERY     right great toe   UMBILICAL HERNIA REPAIR N/A 10/16/2020   Procedure: HERNIA REPAIR UMBILICAL ADULT;  Surgeon: Olean Ree, MD;  Location: [facility] ORS;  Service: General;  Laterality: N/A;    Social History   Socioeconomic History   Marital status: Married    Spouse name: Not on file   Number of children: Not on file   Years of education: Not on file   Highest education level: Not on file  Occupational History   Not on file  Tobacco Use   Smoking status: Never   Smokeless tobacco: Never  Vaping Use   Vaping Use: Never used  Substance and Sexual Activity   Alcohol use: Yes    Alcohol/week: 3.0 standard drinks of alcohol    Types: 3  Standard drinks or equivalent per week    Comment: OCC   Drug use: No   Sexual activity: Yes  Other Topics Concern   Not on file  Social History Narrative   Not on file   Social Determinants of Health   Financial Resource Strain: Not on file  Food Insecurity: Not on file  Transportation Needs: Not on file  Physical Activity: Not on file  Stress: Not on file  Social Connections: Not on file  Intimate Partner Violence: Not on file    Family History  Problem Relation Age of Onset   Heart disease Father    Hypertension Father    Heart failure Father    Heart attack Father 98       both age 68 & 85   Colon cancer Neg Hx     ROS: no fevers or chills, productive cough, hemoptysis, dysphasia, odynophagia, melena, hematochezia, dysuria, hematuria, rash, seizure activity, orthopnea,  PND, pedal edema, claudication. Remaining systems are negative.  Physical Exam: Well-developed well-nourished in no acute distress.  Skin is warm and dry.  HEENT is normal.  Neck is supple.  Chest is clear to auscultation with normal expansion.  Cardiovascular exam is regular rate and rhythm.  Abdominal exam nontender or distended. No masses palpated. Extremities show no edema. neuro grossly intact  ECG-sinus bradycardia at a rate of 58, first-degree AV block, no ST changes.  Personally reviewed  A/P  1 hypertension-patient's blood pressure is mildly elevated.  He also occasionally has pedal edema that is likely due to venous insufficiency.  However we discussed discontinuing amlodipine to see if this was contributing.  He would prefer to continue for now.  He also states his systolic blood pressure is greater than 130 at home.  I discussed adding an additional medication but he would prefer to follow his blood pressure and we will adjust based on follow-up readings.  2 hyperlipidemia-continue statin.  Check lipids and liver.  3 mildly elevated calcium score-patient denies chest pain.  Continue statin.  4 lower extremity edema-he has a history of venous insufficiency.  He is scheduled to follow-up with vascular surgery for this issue.  Kirk Ruths, MD

## 2022-03-08 ENCOUNTER — Encounter: Payer: Self-pay | Admitting: Internal Medicine

## 2022-03-18 ENCOUNTER — Ambulatory Visit: Payer: BC Managed Care – PPO | Attending: Cardiology | Admitting: Cardiology

## 2022-03-18 ENCOUNTER — Encounter: Payer: Self-pay | Admitting: Cardiology

## 2022-03-18 VITALS — BP 136/78 | HR 58 | Ht 70.0 in | Wt 208.2 lb

## 2022-03-18 DIAGNOSIS — I10 Essential (primary) hypertension: Secondary | ICD-10-CM | POA: Diagnosis not present

## 2022-03-18 DIAGNOSIS — E7800 Pure hypercholesterolemia, unspecified: Secondary | ICD-10-CM

## 2022-03-18 DIAGNOSIS — E785 Hyperlipidemia, unspecified: Secondary | ICD-10-CM

## 2022-03-18 LAB — LIPID PANEL
Chol/HDL Ratio: 2.1 ratio (ref 0.0–5.0)
Cholesterol, Total: 141 mg/dL (ref 100–199)
HDL: 66 mg/dL (ref >39)
LDL Chol Calc (NIH): 60 mg/dL (ref 0–99)
Triglycerides: 80 mg/dL (ref 0–149)
VLDL Cholesterol Cal: 15 mg/dL (ref 5–40)

## 2022-03-18 LAB — HEPATIC FUNCTION PANEL
ALT: 24 IU/L (ref 0–44)
AST: 21 IU/L (ref 0–40)
Albumin: 4.9 g/dL (ref 3.8–4.9)
Alkaline Phosphatase: 51 IU/L (ref 44–121)
Bilirubin Total: 0.9 mg/dL (ref 0.0–1.2)
Bilirubin, Direct: 0.27 mg/dL (ref 0.00–0.40)
Total Protein: 6.8 g/dL (ref 6.0–8.5)

## 2022-03-18 NOTE — Patient Instructions (Signed)
    Follow-Up: At [facility], you and your health needs are our priority.  As part of our continuing mission to provide you with exceptional heart care, we have created designated Provider Care Teams.  These Care Teams include your primary Cardiologist (physician) and Advanced Practice Providers (APPs -  Physician Assistants and Nurse Practitioners) who all work together to provide you with the care you need, when you need it.  We recommend signing up for the patient portal called "MyChart".  Sign up information is provided on this After Visit Summary.  MyChart is used to connect with patients for Virtual Visits (Telemedicine).  Patients are able to view lab/test results, encounter notes, upcoming appointments, etc.  Non-urgent messages can be sent to your provider as well.   To learn more about what you can do with MyChart, go to [email or web address].    Your next appointment:   12 month(s)  The format for your next appointment:   In Person  Provider:   Brian Crenshaw MD          

## 2022-05-07 ENCOUNTER — Encounter: Payer: Self-pay | Admitting: Cardiology

## 2022-05-07 DIAGNOSIS — R002 Palpitations: Secondary | ICD-10-CM

## 2022-05-18 ENCOUNTER — Ambulatory Visit: Payer: BC Managed Care – PPO

## 2022-05-18 DIAGNOSIS — R002 Palpitations: Secondary | ICD-10-CM

## 2022-05-18 NOTE — Progress Notes (Unsigned)
Enrolled for [employer] to mail a ZIO XT long term holter monitor to the patients address on file.  

## 2022-05-20 ENCOUNTER — Telehealth: Payer: Self-pay | Admitting: Cardiology

## 2022-05-20 NOTE — Telephone Encounter (Signed)
Caller reports that the patient has refused the Zio patch due to the out-of-pocket expense.  Caller noted no readings have been transmitted as the patient did not wear the patch.  Call provided Ref#  [id number].

## 2022-07-02 ENCOUNTER — Encounter: Payer: BC Managed Care – PPO | Admitting: Family Medicine

## 2022-07-09 ENCOUNTER — Encounter: Payer: BC Managed Care – PPO | Admitting: Family Medicine

## 2022-07-26 ENCOUNTER — Other Ambulatory Visit: Payer: Self-pay | Admitting: Family Medicine

## 2022-07-26 DIAGNOSIS — E785 Hyperlipidemia, unspecified: Secondary | ICD-10-CM

## 2022-08-19 DIAGNOSIS — R361 Hematospermia: Secondary | ICD-10-CM | POA: Insufficient documentation

## 2022-08-26 ENCOUNTER — Other Ambulatory Visit: Payer: Self-pay | Admitting: Family Medicine

## 2022-08-26 DIAGNOSIS — I10 Essential (primary) hypertension: Secondary | ICD-10-CM

## 2022-09-08 ENCOUNTER — Encounter: Payer: Self-pay | Admitting: Family Medicine

## 2022-09-08 DIAGNOSIS — Z8249 Family history of ischemic heart disease and other diseases of the circulatory system: Secondary | ICD-10-CM

## 2022-09-10 ENCOUNTER — Encounter: Payer: Self-pay | Admitting: Cardiology

## 2022-09-10 DIAGNOSIS — Z8249 Family history of ischemic heart disease and other diseases of the circulatory system: Secondary | ICD-10-CM

## 2022-09-10 DIAGNOSIS — I10 Essential (primary) hypertension: Secondary | ICD-10-CM

## 2022-09-10 NOTE — Telephone Encounter (Signed)
AAA ultrasound order--Placed.

## 2022-09-10 NOTE — Telephone Encounter (Signed)
Schedule abd ultrasound to exclude aneurysm Randy [name]

## 2022-09-16 ENCOUNTER — Encounter: Payer: Self-pay | Admitting: Family Medicine

## 2022-09-16 ENCOUNTER — Ambulatory Visit (INDEPENDENT_AMBULATORY_CARE_PROVIDER_SITE_OTHER): Payer: BC Managed Care – PPO | Admitting: Family Medicine

## 2022-09-16 VITALS — BP 112/74 | HR 61 | Temp 98.0°F | Resp 16 | Ht 70.75 in | Wt 209.0 lb

## 2022-09-16 DIAGNOSIS — N400 Enlarged prostate without lower urinary tract symptoms: Secondary | ICD-10-CM

## 2022-09-16 DIAGNOSIS — E785 Hyperlipidemia, unspecified: Secondary | ICD-10-CM | POA: Diagnosis not present

## 2022-09-16 DIAGNOSIS — J301 Allergic rhinitis due to pollen: Secondary | ICD-10-CM | POA: Diagnosis not present

## 2022-09-16 DIAGNOSIS — Z8349 Family history of other endocrine, nutritional and metabolic diseases: Secondary | ICD-10-CM

## 2022-09-16 DIAGNOSIS — I10 Essential (primary) hypertension: Secondary | ICD-10-CM | POA: Diagnosis not present

## 2022-09-16 DIAGNOSIS — L409 Psoriasis, unspecified: Secondary | ICD-10-CM

## 2022-09-16 DIAGNOSIS — K219 Gastro-esophageal reflux disease without esophagitis: Secondary | ICD-10-CM

## 2022-09-16 DIAGNOSIS — Z8249 Family history of ischemic heart disease and other diseases of the circulatory system: Secondary | ICD-10-CM

## 2022-09-16 DIAGNOSIS — Z0000 Encounter for general adult medical examination without abnormal findings: Secondary | ICD-10-CM

## 2022-09-16 DIAGNOSIS — E663 Overweight: Secondary | ICD-10-CM

## 2022-09-16 DIAGNOSIS — Z23 Encounter for immunization: Secondary | ICD-10-CM

## 2022-09-16 LAB — POCT URINALYSIS DIP (PROADVANTAGE DEVICE)
Blood, UA: NEGATIVE
Glucose, UA: NEGATIVE mg/dL
Nitrite, UA: NEGATIVE
Protein Ur, POC: 30 mg/dL — AB
Specific Gravity, Urine: 1.01
Urobilinogen, Ur: 0.2
pH, UA: 7 (ref 5.0–8.0)

## 2022-09-16 MED ORDER — ROSUVASTATIN CALCIUM 20 MG PO TABS: 20.0000 mg | ORAL_TABLET | Freq: Every day | ORAL | 0 refills | Status: DC

## 2022-09-16 MED ORDER — AMLODIPINE BESYLATE 10 MG PO TABS: 10.0000 mg | ORAL_TABLET | Freq: Every day | ORAL | 0 refills | Status: DC

## 2022-09-16 NOTE — Progress Notes (Signed)
Complete physical exam  Patient: Randy [name]   DOB: 12/21/1965   57 y.o. Male  MRN: [id number]  Subjective:    Chief Complaint  Patient presents with   Annual Exam    Fasting. No additional concerns.     Randy [name] is a 57 y.o. male who presents today for a complete physical exam. He reports consuming a general diet. Home exercise routine includes weight lifting, jogging and running. He generally feels fairly well. He reports sleeping fairly well.  He states that his daughter apparently has high iron and there is a question about hemochromatosis.  He would like to be tested for that.  He also is scheduled to be tested for AAA.  Apparently his brother has a 7 cm AAA.  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  His urologist recommended that he could stop taking that.  Apparently also had a PSA of 0.2.  His allergies are under good control.  He continues on Crestor without difficulty.  He is also taking amlodipine for his blood pressure.  He is having no reflux symptoms.  He does have psoriasis but is having no difficulty with that.  Otherwise his family and social history as well as health maintenance and immunizations was reviewed  Most recent fall risk assessment:    09/16/2022    8:18 AM  Fall Risk   Falls in the past year? 0  Number falls in past yr: 0  Injury with Fall? 0  Risk for fall due to : No Fall Risks  Follow up Falls evaluation completed     Most recent depression screenings:    09/16/2022    8:18 AM 06/26/2021    8:25 AM  PHQ 2/9 Scores  PHQ - 2 Score 0 0    Vision:Within last year and Dental: Receives regular dental care    Patient Care Team: Randy Nian, MD as PCP - General (Family Medicine) Randy Bunting, MD as PCP - Cardiology (Cardiology)   Outpatient Medications Prior to Visit  Medication Sig   acetaminophen (TYLENOL) 500 MG tablet Take 2 tablets (1,000 mg total) by mouth every 6 (six) hours as needed for mild pain.    aspirin 81 MG tablet Take 81 mg by mouth daily.   ibuprofen (ADVIL) 600 MG tablet Take 1 tablet (600 mg total) by mouth every 8 (eight) hours as needed for moderate pain.   loratadine (CLARITIN) 10 MG tablet Take 10 mg by mouth every morning.   Multiple Vitamin (MULTIVITAMIN WITH MINERALS) TABS Take 1 tablet by mouth daily.   Omega-3 Fatty Acids (FISH OIL) 1200 MG CAPS Take 2,400 mg by mouth daily.   polyethylene glycol (MIRALAX / GLYCOLAX) 17 g packet Take 17 g by mouth 2 (two) times daily.   finasteride (PROSCAR) 5 MG tablet TAKE 1 TABLET BY MOUTH EVERY DAY (Patient not taking: Reported on 09/16/2022)   PROAIR RESPICLICK 108 (90 Base) MCG/ACT AEPB INHALE 2 PUFFS INTO THE LUNGS 4 (FOUR) TIMES DAILY AS NEEDED. (Patient not taking: Reported on 09/16/2022)   [DISCONTINUED] amLODipine (NORVASC) 10 MG tablet TAKE 1 TABLET BY MOUTH EVERY DAY   [DISCONTINUED] rosuvastatin (CRESTOR) 20 MG tablet TAKE 1 TABLET BY MOUTH EVERY DAY   No facility-administered medications prior to visit.    Review of Systems  All other systems reviewed and are negative.         Objective:     BP 112/74   Pulse 61  Temp 98 F (36.7 C) (Oral)   Resp 16   Ht 5' 10.75" (1.797 m)   Wt 209 lb (94.8 kg)   SpO2 97% Comment: room air  BMI 29.36 kg/m    Physical Exam  Alert and in no distress. Tympanic membranes and canals are normal. Pharyngeal area is normal. Neck is supple without adenopathy or thyromegaly. Cardiac exam shows a regular sinus rhythm without murmurs or gallops. Lungs are clear to auscultation.  Results for orders placed or performed in visit on 09/16/22  POCT Urinalysis DIP (Proadvantage Device)  Result Value Ref Range   Color, UA yellow yellow   Clarity, UA clear clear   Glucose, UA negative negative mg/dL   Bilirubin, UA small (A) negative   Ketones, POC UA small (15) (A) negative mg/dL   Specific Gravity, Urine 1.010    Blood, UA negative negative   pH, UA 7.0 5.0 - 8.0   Protein Ur,  POC =30 (A) negative mg/dL   Urobilinogen, Ur 0.2    Nitrite, UA Negative Negative   Leukocytes, UA Trace (A) Negative       Assessment & Plan:    Routine general medical examination at a health care facility - Plan: POCT Urinalysis DIP (Proadvantage Device)  Essential hypertension - Plan: amLODipine (NORVASC) 10 MG tablet, CBC with Differential/Platelet, Comprehensive metabolic panel  Benign prostatic hyperplasia without lower urinary tract symptoms  Hyperlipidemia LDL goal <100 - Plan: rosuvastatin (CRESTOR) 20 MG tablet, Lipid panel  Seasonal allergic rhinitis due to pollen  Gastroesophageal reflux disease, unspecified whether esophagitis present  Psoriasis  Family history of heart disease in male family member before age 14 - Plan: Lipid panel  Overweight (BMI 25.0-29.9)  Family history of hemochromatosis - Plan: Iron  Immunization History  Administered Date(s) Administered   Influenza Split 02/05/2011, 03/17/2012, 02/18/2013, 02/16/2014   Influenza Whole 03/09/2007, 02/18/2009, 03/01/2010   Influenza,inj,Quad PF,6+ Mos 02/07/2015, 01/26/2019, 03/22/2020, 02/20/2022   Influenza-Unspecified 03/05/2016, 03/10/2017, 02/22/2018, 03/01/2021   PFIZER(Purple Top)SARS-COV-2 Vaccination 08/09/2019, 09/03/2019, 04/25/2020   Pfizer Covid-19 Vaccine Bivalent Booster 59yrs & up 04/24/2021   Tdap 05/06/2014    Health Maintenance  Topic Date Due   Zoster Vaccines- Shingrix (1 of 2) Never done   COVID-19 Vaccine (5 - 2023-24 season) 01/15/2022   INFLUENZA VACCINE  12/16/2022   DTaP/Tdap/Td (2 - Td or Tdap) 05/06/2024   COLONOSCOPY (Pts 45-32yrs Insurance coverage will need to be confirmed)  07/17/2025   Hepatitis C Screening  Completed   HIV Screening  Completed   HPV VACCINES  Aged Out    Discussed continuing on the present medication regimen and possibly changing the statin drug to a more potent 1 based on his blood work.  Discussed PSA testing with him and the risk with his  present level of 0.2..  Problem List Items Addressed This Visit     Family history of heart disease in male family member before age 20 (Chronic)   Relevant Orders   Lipid panel   Benign prostatic hyperplasia without lower urinary tract symptoms   Essential hypertension   Relevant Medications   amLODipine (NORVASC) 10 MG tablet   rosuvastatin (CRESTOR) 20 MG tablet   Other Relevant Orders   CBC with Differential/Platelet   Comprehensive metabolic panel   GERD   Hyperlipidemia LDL goal <100   Relevant Medications   amLODipine (NORVASC) 10 MG tablet   rosuvastatin (CRESTOR) 20 MG tablet   Other Relevant Orders   Lipid panel   Overweight (BMI 25.0-29.9)  Psoriasis   Seasonal allergic rhinitis due to pollen   Other Visit Diagnoses     Routine general medical examination at a health care facility    -  Primary   Relevant Orders   POCT Urinalysis DIP (Proadvantage Device) (Completed)   Family history of hemochromatosis       Relevant Orders   Iron      Follow up in one year     Sharlot Gowda, MD

## 2022-09-17 LAB — CBC WITH DIFFERENTIAL/PLATELET
Basophils Absolute: 0.1 10*3/uL (ref 0.0–0.2)
Basos: 1 %
EOS (ABSOLUTE): 0.1 10*3/uL (ref 0.0–0.4)
Eos: 1 %
Hematocrit: 45 % (ref 37.5–51.0)
Hemoglobin: 15.3 g/dL (ref 13.0–17.7)
Immature Grans (Abs): 0 10*3/uL (ref 0.0–0.1)
Immature Granulocytes: 0 %
Lymphocytes Absolute: 1.8 10*3/uL (ref 0.7–3.1)
Lymphs: 27 %
MCH: 31.7 pg (ref 26.6–33.0)
MCHC: 34 g/dL (ref 31.5–35.7)
MCV: 93 fL (ref 79–97)
Monocytes Absolute: 0.4 10*3/uL (ref 0.1–0.9)
Monocytes: 6 %
Neutrophils Absolute: 4.5 10*3/uL (ref 1.4–7.0)
Neutrophils: 65 %
Platelets: 261 10*3/uL (ref 150–450)
RBC: 4.83 x10E6/uL (ref 4.14–5.80)
RDW: 12.9 % (ref 11.6–15.4)
WBC: 6.8 10*3/uL (ref 3.4–10.8)

## 2022-09-17 LAB — IRON: Iron: 107 ug/dL (ref 38–169)

## 2022-09-17 LAB — COMPREHENSIVE METABOLIC PANEL
ALT: 33 IU/L (ref 0–44)
AST: 28 IU/L (ref 0–40)
Albumin/Globulin Ratio: 2.5 — ABNORMAL HIGH (ref 1.2–2.2)
Albumin: 4.7 g/dL (ref 3.8–4.9)
Alkaline Phosphatase: 58 IU/L (ref 44–121)
BUN/Creatinine Ratio: 12 (ref 9–20)
BUN: 14 mg/dL (ref 6–24)
Bilirubin Total: 0.7 mg/dL (ref 0.0–1.2)
CO2: 23 mmol/L (ref 20–29)
Calcium: 9.5 mg/dL (ref 8.7–10.2)
Chloride: 104 mmol/L (ref 96–106)
Creatinine, Ser: 1.16 mg/dL (ref 0.76–1.27)
Globulin, Total: 1.9 g/dL (ref 1.5–4.5)
Glucose: 104 mg/dL — ABNORMAL HIGH (ref 70–99)
Potassium: 5.4 mmol/L — ABNORMAL HIGH (ref 3.5–5.2)
Sodium: 143 mmol/L (ref 134–144)
Total Protein: 6.6 g/dL (ref 6.0–8.5)
eGFR: 73 mL/min/{1.73_m2} (ref >59)

## 2022-09-17 LAB — LIPID PANEL
Chol/HDL Ratio: 2 ratio (ref 0.0–5.0)
Cholesterol, Total: 130 mg/dL (ref 100–199)
HDL: 64 mg/dL (ref >39)
LDL Chol Calc (NIH): 50 mg/dL (ref 0–99)
Triglycerides: 82 mg/dL (ref 0–149)
VLDL Cholesterol Cal: 16 mg/dL (ref 5–40)

## 2022-09-27 ENCOUNTER — Ambulatory Visit (HOSPITAL_COMMUNITY)
Admission: RE | Admit: 2022-09-27 | Discharge: 2022-09-27 | Disposition: A | Discharge status: Home / Self Care | Payer: BC Managed Care – PPO | Source: Ambulatory Visit | Attending: Cardiology | Admitting: Cardiology

## 2022-09-27 DIAGNOSIS — I10 Essential (primary) hypertension: Secondary | ICD-10-CM | POA: Diagnosis present

## 2022-09-27 DIAGNOSIS — Z8249 Family history of ischemic heart disease and other diseases of the circulatory system: Secondary | ICD-10-CM

## 2022-09-27 DIAGNOSIS — E785 Hyperlipidemia, unspecified: Secondary | ICD-10-CM | POA: Insufficient documentation

## 2022-09-29 ENCOUNTER — Encounter: Payer: Self-pay | Admitting: Cardiology

## 2022-12-16 ENCOUNTER — Other Ambulatory Visit: Payer: Self-pay | Admitting: Family Medicine

## 2022-12-16 DIAGNOSIS — E785 Hyperlipidemia, unspecified: Secondary | ICD-10-CM

## 2022-12-16 DIAGNOSIS — I10 Essential (primary) hypertension: Secondary | ICD-10-CM

## 2022-12-24 ENCOUNTER — Telehealth: Payer: Self-pay | Admitting: Cardiology

## 2022-12-24 NOTE — Telephone Encounter (Signed)
Pt is requesting a callback regarding him being due for his yearly in Nov but MD doesn't have anything available. He was offered to scheduled with a NP or PA but pt declined until he speaks with a nurse to find out if he will still have an EKG done at his appt and would like to know exactly how his office visit would be before scheduling vs adding him to MD wait list. Please advise

## 2022-12-24 NOTE — Telephone Encounter (Signed)
Returned pt call. He was concerned that if he was being seen by a NP or PA he would not have the same type of visit as he does with the MD. This nurse assured him that the visit will be the same as it is with the MD. Scheduled pt for 03/22/23 with A. [facility]

## 2023-03-08 NOTE — Progress Notes (Signed)
Cardiology Office Note:    Date:  03/22/2023   ID:  KYL GIVLER, DOB [birth year], MRN [id number]  PCP:  Ronnald Nian, MD   [facility] Providers Cardiologist:  Olga Millers, MD Cardiology APP:  Marcelino Duster, PA     Referring MD: Ronnald Nian, MD   Chief Complaint  Patient presents with   Follow-up    HTN    History of Present Illness:    Randy [name] is a 57 y.o. male with a hx of HTN, HLD, GERD, venous insufficiency, family history of premature CAD. Has followed with Dr. Kirke Corin 01/2013 for venous insufficiency treated conservatively. Stress echo 06/2013 nonischemic. Coronary calcium score 10/2020 was 9.55 placing him at the 53rd percentile. Last seen by Dr. Jens Som with worsening LE edema. Discontinuation of amlodipine was discussed, but ultimately continued.   He exercises 4 times per week by running 3 miles and lifting weights. No cardiac complaints.  He doing well. He had hernia surgery 3 years ago and recounts a complication of urinary retention and AKI. No further issues. We reviewed that K is 5.4, he is due for repeat labs this week with his work. He also asks about changing amlodipine with issues with venous insufficiency. Could try ARB - will need to watch K.  He does not want to change anything now.   Past Medical History:  Diagnosis Date   Family history of ischemic heart disease    Febrile seizure (HCC)    AS A CHILD   GERD (gastroesophageal reflux disease)    RARE- NO MEDS   History of hiatal hernia    SMALL   Hypertension    Obesity    Psoriasis    Seasonal allergies    Venous insufficiency     Past Surgical History:  Procedure Laterality Date   CYSTECTOMY     left chest   superficial vein removed Bilateral    LEGS   TOE SURGERY     right great toe   UMBILICAL HERNIA REPAIR N/A 10/16/2020   Procedure: HERNIA REPAIR UMBILICAL ADULT;  Surgeon: Henrene Dodge, MD;  Location: [facility] ORS;  Service: General;  Laterality: N/A;     Current Medications: Current Meds  Medication Sig   aspirin 81 MG tablet Take 81 mg by mouth daily.   ibuprofen (ADVIL) 600 MG tablet Take 1 tablet (600 mg total) by mouth every 8 (eight) hours as needed for moderate pain.   loratadine (CLARITIN) 10 MG tablet Take 10 mg by mouth every morning.   Multiple Vitamin (MULTIVITAMIN WITH MINERALS) TABS Take 1 tablet by mouth daily.   Omega-3 Fatty Acids (FISH OIL) 1200 MG CAPS Take 2,400 mg by mouth daily.   polyethylene glycol (MIRALAX / GLYCOLAX) 17 g packet Take 17 g by mouth 2 (two) times daily. (Patient taking differently: Take 17 g by mouth 2 (two) times daily as needed for moderate constipation or mild constipation.)   PROAIR RESPICLICK 108 (90 Base) MCG/ACT AEPB INHALE 2 PUFFS INTO THE LUNGS 4 (FOUR) TIMES DAILY AS NEEDED.   [DISCONTINUED] amLODipine (NORVASC) 10 MG tablet TAKE 1 TABLET(10 MG) BY MOUTH DAILY   [DISCONTINUED] rosuvastatin (CRESTOR) 20 MG tablet TAKE 1 TABLET(20 MG) BY MOUTH DAILY     Allergies:   Patient has no known allergies.   Social History   Socioeconomic History   Marital status: Married    Spouse name: Not on file   Number of children: Not on file   Years of  education: Not on file   Highest education level: Not on file  Occupational History   Not on file  Tobacco Use   Smoking status: Never   Smokeless tobacco: Never  Vaping Use   Vaping status: Never Used  Substance and Sexual Activity   Alcohol use: Yes    Alcohol/week: 3.0 standard drinks of alcohol    Types: 3 Standard drinks or equivalent per week    Comment: OCC   Drug use: No   Sexual activity: Yes  Other Topics Concern   Not on file  Social History Narrative   Not on file   Social Determinants of Health   Financial Resource Strain: Not on file  Food Insecurity: Not on file  Transportation Needs: Not on file  Physical Activity: Not on file  Stress: Not on file  Social Connections: Not on file     Family History: The patient's  family history includes Heart attack (age of onset: 29) in his father; Heart disease in his father; Heart failure in his father; Hypertension in his father. There is no history of Colon cancer.  ROS:   Please see the history of present illness.     All other systems reviewed and are negative.  EKGs/Labs/Other Studies Reviewed:    The following studies were reviewed today:  Cardiac Studies & Procedures          CT SCANS  CT CARDIAC SCORING (SELF PAY ONLY) 10/21/2020  Addendum 10/21/2020 10:57 AM ADDENDUM REPORT: 10/21/2020 10:55  CLINICAL DATA:  Cardiovascular Disease Risk stratification  EXAM: Coronary Calcium Score  TECHNIQUE: A gated, non-contrast computed tomography scan of the heart was performed using 3mm slice thickness. Axial images were analyzed on a dedicated workstation. Calcium scoring of the coronary arteries was performed using the Agatston method.  FINDINGS: Coronary arteries: Normal origins.  Coronary Calcium Score:  Left main: 0  Left anterior descending artery: 5.45  Left circumflex artery: 4.1  Right coronary artery: 0  Total: 9.55  Percentile: 53rd  Pericardium: Normal.  Ascending Aorta: Normal caliber.  Non-cardiac: See separate report from [facility].  IMPRESSION: Coronary calcium score of 9.55. This was 53rd percentile for age-, race-, and sex-matched controls.  RECOMMENDATIONS: Coronary artery calcium (CAC) score is a strong predictor of incident coronary heart disease (CHD) and provides predictive information beyond traditional risk factors. CAC scoring is reasonable to use in the decision to withhold, postpone, or initiate statin therapy in intermediate-risk or selected borderline-risk asymptomatic adults (age 55-75 years and LDL-C >=70 to <190 mg/dL) who do not have diabetes or established atherosclerotic cardiovascular disease (ASCVD).* In intermediate-risk (10-year ASCVD risk >=7.5% to <20%) adults or selected  borderline-risk (10-year ASCVD risk >=5% to <7.5%) adults in whom a CAC score is measured for the purpose of making a treatment decision the following recommendations have been made:  If CAC=0, it is reasonable to withhold statin therapy and reassess in 5 to 10 years, as long as higher risk conditions are absent (diabetes mellitus, family history of premature CHD in first degree relatives (males <55 years; females <65 years), cigarette smoking, or LDL >=190 mg/dL).  If CAC is 1 to 99, it is reasonable to initiate statin therapy for patients >=27 years of age.  If CAC is >=100 or >=75th percentile, it is reasonable to initiate statin therapy at any age.  Cardiology referral should be considered for patients with CAC scores >=400 or >=75th percentile.  *2018 AHA/ACC/AACVPR/AAPA/ABC/ACPM/ADA/AGS/APhA/ASPC/NLA/PCNA Guideline on the Management of Blood Cholesterol: A Report of  the [employer] of Cardiology/American Heart Association Task Force on Clinical Practice Guidelines. J Am Coll Cardiol. 2019;73(24):3168-3209.  Armanda Magic, MD   Electronically Signed By: Armanda Magic On: 10/21/2020 10:55  Narrative EXAM: OVER-READ INTERPRETATION  CT CHEST  The following report is an over-read performed by radiologist Dr. Trudie Reed of [facility] on 10/21/2020. This over-read does not include interpretation of cardiac or coronary anatomy or pathology. The coronary calcium score interpretation by the cardiologist is attached.  COMPARISON:  None.  FINDINGS: Within the visualized portions of the thorax there are no suspicious appearing pulmonary nodules or masses, there is no acute consolidative airspace disease, no pleural effusions, no pneumothorax and no lymphadenopathy. Visualized portions of the upper abdomen are unremarkable. There are no aggressive appearing lytic or blastic lesions noted in the visualized portions of the skeleton.  IMPRESSION: 1. No  significant incidental noncardiac findings are noted.  Electronically Signed: By: Trudie Reed M.D. On: 10/21/2020 08:11          EKG Interpretation Date/Time:  Tuesday March 22 2023 08:30:27 EST Ventricular Rate:  67 PR Interval:  228 QRS Duration:  110 QT Interval:  402 QTC Calculation: 424 R Axis:   60  Text Interpretation: Sinus rhythm with 1st degree A-V block When compared with ECG of 26-Dec-2011 18:12, No significant change was found Confirmed by Micah Flesher ([id number]) on 03/22/2023 8:47:06 AM    Recent Labs: 09/16/2022: ALT 33; BUN 14; Creatinine, Ser 1.16; Hemoglobin 15.3; Platelets 261; Potassium 5.4; Sodium 143  Recent Lipid Panel    Component Value Date/Time   CHOL 130 09/16/2022 0851   TRIG 82 09/16/2022 0851   HDL 64 09/16/2022 0851   CHOLHDL 2.0 09/16/2022 0851   CHOLHDL 4.2 09/03/2016 0753   VLDL 20 09/03/2016 0753   LDLCALC 50 09/16/2022 0851     Risk Assessment/Calculations:                Physical Exam:    VS:  BP 134/78 (BP Location: Left Arm, Patient Position: Sitting, Cuff Size: Normal)   Pulse 83   Ht 5\' 10"  (1.778 m)   Wt 213 lb 12.8 oz (97 kg)   SpO2 97%   BMI 30.68 kg/m     Wt Readings from Last 3 Encounters:  03/22/23 213 lb 12.8 oz (97 kg)  09/16/22 209 lb (94.8 kg)  03/18/22 208 lb 3.2 oz (94.4 kg)     GEN:  Well nourished, well developed in no acute distress HEENT: Normal NECK: No JVD; No carotid bruits LYMPHATICS: No lymphadenopathy CARDIAC: RRR, no murmurs, rubs, gallops RESPIRATORY:  Clear to auscultation without rales, wheezing or rhonchi  ABDOMEN: Soft, non-tender, non-distended MUSCULOSKELETAL:  No edema; No deformity  SKIN: Warm and dry NEUROLOGIC:  Alert and oriented x 3 PSYCHIATRIC:  Normal affect   ASSESSMENT:    1. Essential hypertension   2. Hyperlipidemia LDL goal <100   3. Coronary artery calcification   4. Bilateral lower extremity edema   5. Venous insufficiency    PLAN:    In order of  problems listed above:  Hypertension - amlodipine 10 mg - BP controlled - can try ARB if he wants to stop amlodipine, will need BMP to watch K   Coronary calcification - ASA, 20 mg crestor, fish oil - risk factor modification   Hyperlipidemia with LDL goal < 70 09/16/2022: Cholesterol, Total 130; HDL 64; LDL Chol Calc (NIH) 50; Triglycerides 82 Continue 20 mg crestor   Lower extremity  edema - hx of venous insufficiency - following with vascular surgery - advised low sodium diet   Dilated iliac arteries - repeat AAA [place] 09/2024 - control BP  Follow up in 1 year.            Medication Adjustments/Labs and Tests Ordered: Current medicines are reviewed at length with the patient today.  Concerns regarding medicines are outlined above.  Orders Placed This Encounter  Procedures   EKG 12-Lead   Meds ordered this encounter  Medications   rosuvastatin (CRESTOR) 20 MG tablet    Sig: Take 1 tablet (20 mg total) by mouth daily.    Dispense:  90 tablet    Refill:  3   amLODipine (NORVASC) 10 MG tablet    Sig: Take 1 tablet (10 mg total) by mouth daily.    Dispense:  90 tablet    Refill:  30    Patient Instructions  Medication Instructions:  Your physician recommends that you continue on your current medications as directed. Please refer to the Current Medication list given to you today.  *If you need a refill on your cardiac medications before your next appointment, please call your pharmacy*   Lab Work: NONE ordered at this time of appointment     Testing/Procedures: NONE ordered at this time of appointment     Follow-Up: At [facility], you and your health needs are our priority.  As part of our continuing mission to provide you with exceptional heart care, we have created designated Provider Care Teams.  These Care Teams include your primary Cardiologist (physician) and Advanced Practice Providers (APPs -  Physician Assistants and Nurse  Practitioners) who all work together to provide you with the care you need, when you need it.  We recommend signing up for the patient portal called "MyChart".  Sign up information is provided on this After Visit Summary.  MyChart is used to connect with patients for Virtual Visits (Telemedicine).  Patients are able to view lab/test results, encounter notes, upcoming appointments, etc.  Non-urgent messages can be sent to your provider as well.   To learn more about what you can do with MyChart, go to [email or web address].    Your next appointment:   1 year(s)  Provider:   Olga Millers, MD         Signed, Roe Rutherford Gabryel Talamo, PA  03/22/2023 9:10 AM    [facility]

## 2023-03-22 ENCOUNTER — Ambulatory Visit: Payer: No Typology Code available for payment source | Attending: Physician Assistant | Admitting: Physician Assistant

## 2023-03-22 ENCOUNTER — Encounter: Payer: Self-pay | Admitting: Physician Assistant

## 2023-03-22 VITALS — BP 134/78 | HR 83 | Ht 70.0 in | Wt 213.8 lb

## 2023-03-22 DIAGNOSIS — I872 Venous insufficiency (chronic) (peripheral): Secondary | ICD-10-CM

## 2023-03-22 DIAGNOSIS — I10 Essential (primary) hypertension: Secondary | ICD-10-CM

## 2023-03-22 DIAGNOSIS — R600 Localized edema: Secondary | ICD-10-CM

## 2023-03-22 DIAGNOSIS — E785 Hyperlipidemia, unspecified: Secondary | ICD-10-CM

## 2023-03-22 DIAGNOSIS — I2510 Atherosclerotic heart disease of native coronary artery without angina pectoris: Secondary | ICD-10-CM | POA: Diagnosis not present

## 2023-03-22 MED ORDER — AMLODIPINE BESYLATE 10 MG PO TABS: 10.0000 mg | ORAL_TABLET | Freq: Every day | ORAL | 30 refills | Status: DC

## 2023-03-22 MED ORDER — ROSUVASTATIN CALCIUM 20 MG PO TABS: 20.0000 mg | ORAL_TABLET | Freq: Every day | ORAL | 3 refills | Status: DC

## 2023-03-22 NOTE — Patient Instructions (Signed)
Medication Instructions:  Your physician recommends that you continue on your current medications as directed. Please refer to the Current Medication list given to you today.  *If you need a refill on your cardiac medications before your next appointment, please call your pharmacy*   Lab Work: NONE ordered at this time of appointment     Testing/Procedures: NONE ordered at this time of appointment    Follow-Up: At [facility], you and your health needs are our priority.  As part of our continuing mission to provide you with exceptional heart care, we have created designated Provider Care Teams.  These Care Teams include your primary Cardiologist (physician) and Advanced Practice Providers (APPs -  Physician Assistants and Nurse Practitioners) who all work together to provide you with the care you need, when you need it.  We recommend signing up for the patient portal called "MyChart".  Sign up information is provided on this After Visit Summary.  MyChart is used to connect with patients for Virtual Visits (Telemedicine).  Patients are able to view lab/test results, encounter notes, upcoming appointments, etc.  Non-urgent messages can be sent to your provider as well.   To learn more about what you can do with MyChart, go to [email or web address].    Your next appointment:   1 year(s)  Provider:   Brian Crenshaw, MD        

## 2023-05-23 ENCOUNTER — Ambulatory Visit: Payer: No Typology Code available for payment source | Admitting: Medical

## 2023-05-23 ENCOUNTER — Encounter: Payer: Self-pay | Admitting: Medical

## 2023-05-23 ENCOUNTER — Encounter: Payer: Self-pay | Admitting: Family Medicine

## 2023-05-23 VITALS — BP 134/82 | HR 63 | Temp 98.0°F | Wt 220.0 lb

## 2023-05-23 DIAGNOSIS — S76312A Strain of muscle, fascia and tendon of the posterior muscle group at thigh level, left thigh, initial encounter: Secondary | ICD-10-CM

## 2023-05-23 DIAGNOSIS — S76302A Unspecified injury of muscle, fascia and tendon of the posterior muscle group at thigh level, left thigh, initial encounter: Secondary | ICD-10-CM | POA: Diagnosis not present

## 2023-05-23 NOTE — Patient Instructions (Addendum)
 Hamstring Strain  A hamstring strain happens when the muscles in the back of the thighs (hamstring muscles) are overstretched or torn. The hamstring muscles are used in straightening the hips, bending the knees, and pulling back the legs. This injury is often called a pulled hamstring muscle. The tissue that connects the muscle to a bone (tendon) may also be affected. The severity of a hamstring strain may be rated in degrees or grades. First-degree (or grade 1) strains have the least amount of muscle tearing and pain. Second-degree and third-degree (grade 2 and 3) strains have increasingly more tearing and pain. What are the causes? This condition is caused by a sudden, violent force being placed on the hamstring muscles, stretching them too far. This often happens during activities that involve sprinting, jumping, kicking, or weight lifting. What increases the risk? Hamstring strains are especially common in athletes. The following factors may also make you more likely to develop this condition: Having low strength, endurance, or flexibility of the hamstring muscles. Doing high-impact physical activity or sports. Having poor physical fitness. Having a previous leg injury. Having tired (fatigued) muscles. Having a previous hamstring strain. What are the signs or symptoms? Symptoms of this condition include: Pain in the back of the thigh or buttocks. Swelling. Bruising. Muscle spasms. Trouble moving the affected muscle because of pain. For severe strains, you may feel popping or snapping in the back of your thigh when the injury occurs. How is this diagnosed? This condition is diagnosed based on your symptoms, your medical history, and a physical exam. You may also have imaging tests, such as an MRI or X-rays. Your strain may be rated based on how severe it is. The ratings are: Grade 1 strain (mild). Muscles are overstretched. There may be very small muscle tears. Grade 2 strain  (moderate). Muscles are partially torn. Grade 3 strain (severe). Muscles are completely torn. How is this treated? Treatment for this condition usually involves: Protecting, resting, icing, applying compression, and elevating the injured area (PRICE therapy). Medicines. Your health care provider may recommend medicines to help reduce pain or inflammation. Doing exercises to regain strength and flexibility in the muscles. Your health care provider will tell you when it is okay to begin exercising. Hamstring strains may take a long time to heal. This type of strain may happen again in athletes. Follow your health care provider's advice about when to return to sports-related activities. Follow these instructions at home: PRICE therapy Use PRICE therapy to promote muscle healing during the first 2-3 days after your injury, or as told by your health care provider. Protect the muscle from being injured again.  Avoid sprinting, sudden explosive motion, avoid heavy lifting the next 2-3 weeks, avoid excessive walking/hiking for the next 2-3 weeks Rest your injury. This usually involves limiting your normal activities and not using the injured hamstring muscle. Talk with your health care provider about how you should limit your activities. Alternate ice and heat therapy.   Apply ice to the injured area: Put ice in a plastic bag. Place a towel between your skin and the bag. Leave the ice on for 20 minutes, 2-3 times a day. After the third day, switch to applying heat as told. Apply heat with warm moist towel alternating with cold therapy Put pressure (compression) on your injured hamstring by wrapping it with an elastic bandage. Be careful not to wrap it too tightly. That may interfere with blood circulation or may increase swelling. Raise (elevate) your injured hamstring above  the level of your heart as often as possible. When you are lying down, you can do this by putting a pillow under your  thigh.   General instructions Use over the counter Aleve once or twice daily the next 3-4 days and as needed for flare up of pain If directed, apply heat to the affected area as often as told by your health care provider. Use the heat source that your health care provider recommends, such as a moist heat pack or a heating pad. Place a towel between your skin and the heat source. Leave the heat on for 20-30 minutes. Remove the heat if your skin turns bright red. This is especially important if you are unable to feel pain, heat, or cold. You may have a greater risk of getting burned. Keep all follow-up visits. This is important. Contact a health care provider if: You have increasing pain or swelling in the injured area. You have numbness, tingling, or a significant loss of strength in the injured area. Get help right away if: Your foot or your toes become cold or turn blue.   Summary A hamstring strain happens when the muscles in the back of the thighs (hamstring muscles) are overstretched or torn. This injury can be caused by a sudden, violent force being placed on the hamstring muscles, causing them to stretch too far. Symptoms include pain, swelling, and muscle spasms in the injured area. Treatment includes PRICE therapy: protecting, resting, icing, applying compression, and elevating the injured area. This information is not intended to replace advice given to you by your health care provider. Make sure you discuss any questions you have with your health care provider. Document Revised: 09/06/2022 Document Reviewed: 10/02/2020 Elsevier Patient Education  2024 [employer]   Activity Stretching and range-of-motion exercises These exercises warm up your muscles and joints and improve the movement and flexibility of your thighs. These exercises also help to relieve pain, numbness, and tingling. Talk to your health care provider about these restrictions.   Knee extension,  seated  Sit with your left / right heel propped on a chair, a coffee table, or a footstool. Do not have anything under your knee to support it. Allow your leg muscles to relax, letting gravity straighten out your knee (extension). You should feel a stretch behind your left / right knee.   Seated stretch This exercise is sometimes called hamstrings and adductors stretch. Sit on the floor with your legs stretched wide. Keep your knees straight during this exercise. Keeping your head and back in a straight line, bend at your waist to reach for your left foot. You should feel a stretch in your right inner thigh (adductors). Hold this position for ____10______ seconds. Then slowly return to the upright position. Keeping your head and back in a straight line, bend at your waist to reach forward. You should feel a stretch behind both of your thighs or knees (hamstrings). Hold this position for ____10______ seconds. Then slowly return to the upright position. Keeping your head and back in a straight line, bend at your waist to reach for your right foot. You should feel a stretch in your left inner thigh (adductors). Hold this position for ____10______ seconds. Then slowly return to the upright position. Repeat _______1-2___ times. Complete this exercise ____1-2______ times a day.   Hamstrings stretch, supine  Lie on your back (supine position). Loop a belt or towel over the ball of your left / right foot. The ball of your foot is  on the walking surface, right under your toes. Straighten your left / right knee and slowly pull on the belt or towel to raise your leg. Do not let your left / right knee bend while you do this. Keep your other leg flat on the floor. Raise the left / right leg until you feel a gentle stretch behind your left / right knee or thigh (hamstrings). Hold this position for _____10_____ seconds. Slowly return your leg to the starting position. Repeat ______1-2____ times.  Complete this exercise ___1-2_______ times a day. Strengthening exercises These exercises build strength and endurance in your thighs. Endurance is the ability to use your muscles for a long time, even after they get tired.   Straight leg raises, prone This exercise strengthens the muscles that move the hips (hip extensors). Lie on your abdomen on a firm surface (prone position). Tense the muscles in your buttocks and lift your left / right leg about 4 inches (10 cm). Keep your knee straight as you lift your leg. If you cannot lift your leg that high without arching your back, place a pillow under your hips. Hold the position for ___10_______ seconds. Slowly lower your leg to the starting position. Allow your muscles to relax completely before you start the next repetition. Repeat ______1-2__ times. Complete this exercise ____1-2______ times a day. Bridge This exercise strengthens the muscles in your buttocks and the back of your thighs (hip extensors). Lie on your back on a firm surface with your knees bent and your feet flat on the floor. Tighten your buttocks muscles and lift your bottom off the floor until the trunk of your body is level with your thighs. You should feel the muscles working in your buttocks and the back of your thighs. Do not arch your back. Hold this position for _____10_____ seconds. Slowly lower your hips to the starting position. Let your buttocks muscles relax completely between repetitions. If told by your health care provider, keep your bottom lifted off the floor while you slowly walk your feet away from you as far as you can control. Hold for ____10______ seconds, then slowly walk your feet back toward you. Repeat _______1-2___ times. Complete this exercise _____1-2_____ times a day.   Lateral walking with band This is an exercise in which you walk sideways (lateral), with tension provided by an exercise band. The exercise strengthens the muscles in your hip  (hip abductors). Stand in a long hallway. Wrap a loop of exercise band around your legs, just above your knees. Bend your knees gently and drop your hips down and back so your weight is over your heels. Step to the side to move down the length of the hallway, keeping your toes pointed ahead of you and keeping tension in the band. Repeat, leading with your other leg. Repeat ___1-2_______ times. Complete this exercise ____1-2______ times a day.  Single leg stand with reaching This exercise is also called eccentric hamstring stretch. Stand on your left / right foot. Keep your big toe down on the floor and try to keep your arch lifted. Slowly reach down toward the floor as far as you can while keeping your balance. Lowering your thigh under tension is called eccentric stretching. Hold this position for ____10______ seconds. Repeat ______1-2____ times. Complete this exercise ______1-2____ times a day.  Plank, prone This exercise strengthens muscles in your abdomen and core area. Lie on your abdomen on the floor (prone position),and prop yourself up on your elbows. Your hands should be straight  out in front of you, and your elbows should be below your shoulders. Position your feet similar to a push-up position so your toes are on the ground. Tighten your abdominal muscles and lift your body off the floor. Do not arch your back. Do not hold your breath. Hold this position for ____10______ seconds. Repeat __1-2________ times. Complete this exercise ___1-2_______ times a day. This information is not intended to replace advice given to you by your health care provider. Make sure you discuss any questions you have with your health care provider. Document Revised: 09/06/2022 Document Reviewed: 10/27/2020 Elsevier Patient Education  2024 [employer].

## 2023-05-23 NOTE — Progress Notes (Signed)
 Subjective:  Randy [name] is a 58 y.o. male who presents for Chief Complaint  Patient presents with   Muscle Pain    Pulled hamstring (pictures in chart). Unable to sleep, hard to bend knee.      Left hamstring injury.  About a week ago he started feeling pain in the left hamstring while running track.  He has been running 2.5 to 3 miles on the indoor track regularly.  He did not have a sudden explosive motion or did not have a specific other injury.  He does do weightbearing exercises well but does not do a lot of leg related strength training  No other injury no other heavy lifting or things that would have caused an injury just runs regularly on the track  When he first felt the pain he stopped and rested then did some walking.  He has used some ice and ibuprofen .  After a few days of rest, tried to do another 2.5 mile run and yesterday had pain all day and noticed bruising and swelling of hamstring and behind left knee  No other injury or pain.  Works in anim[employer]  No other aggravating or relieving factors.    No other c/o.  Past Medical History:  Diagnosis Date   Family history of ischemic heart disease    Febrile seizure (HCC)    AS A CHILD   GERD (gastroesophageal reflux disease)    RARE- NO MEDS   History of hiatal hernia    SMALL   Hypertension    Obesity    Psoriasis    Seasonal allergies    Venous insufficiency    Current Outpatient Medications on File Prior to Visit  Medication Sig Dispense Refill   amLODipine  (NORVASC ) 10 MG tablet Take 1 tablet (10 mg total) by mouth daily. 90 tablet 30   aspirin  81 MG tablet Take 81 mg by mouth daily.     ibuprofen  (ADVIL ) 600 MG tablet Take 1 tablet (600 mg total) by mouth every 8 (eight) hours as needed for moderate pain. 60 tablet 1   loratadine (CLARITIN) 10 MG tablet Take 10 mg by mouth every morning.     Multiple Vitamin (MULTIVITAMIN WITH MINERALS) TABS Take 1 tablet by mouth daily.     Omega-3 Fatty Acids (FISH  OIL) 1200 MG CAPS Take 2,400 mg by mouth daily.     rosuvastatin  (CRESTOR ) 20 MG tablet Take 1 tablet (20 mg total) by mouth daily. 90 tablet 3   acetaminophen  (TYLENOL ) 500 MG tablet Take 2 tablets (1,000 mg total) by mouth every 6 (six) hours as needed for mild pain. (Patient not taking: Reported on 03/22/2023)     finasteride  (PROSCAR ) 5 MG tablet TAKE 1 TABLET BY MOUTH EVERY DAY 90 tablet 3   polyethylene glycol (MIRALAX  / GLYCOLAX ) 17 g packet Take 17 g by mouth 2 (two) times daily. (Patient not taking: Reported on 05/23/2023) 14 each 0   PROAIR  RESPICLICK 108 (90 Base) MCG/ACT AEPB INHALE 2 PUFFS INTO THE LUNGS 4 (FOUR) TIMES DAILY AS NEEDED. (Patient not taking: Reported on 05/23/2023) 1 each 0   No current facility-administered medications on file prior to visit.     The following portions of the patient's history were reviewed and updated as appropriate: allergies, current medications, past family history, past medical history, past social history, past surgical history and problem list.  ROS Otherwise as in subjective above    Objective: BP 134/82   Pulse 63   Temp  98 F (36.7 C) (Oral)   Wt 220 lb (99.8 kg)   SpO2 98%   BMI 31.57 kg/m   General appearance: alert, no distress, well developed, well nourished Legs neurovascularly intact Left posterior thigh with localized soft tissue swelling just superior to left medial popliteal area, there is purplish yellow bruising of left medial thigh behind knee  and up medial thigh.  Tender over same area of bruising swelling as above Mild pain with hamstring flexion, but otherwise leg nontender and normal ROM otherwise Rest of legs unremarkable    Assessment: Encounter Diagnoses  Name Primary?   Hamstring injury, left, initial encounter Yes   Partial hamstring tear, left, initial encounter      Plan: We discussed the findings and symptoms. Begin Aleve over-the-counter, 2 tablets twice a day the next 3 to 5 days Use cold  therapy with ice water for 3 days then alternate with ice and heat for the next week Consider hamstring compressive band or Ace wrap over the next week We discussed stretching and range of motion activity and gave him a handout on this to use for the next 7 to 10 days He can use upper body to do strength training as long as he is seated such as machine weights to avoid relying on the thigh for support He has access to a swimming pool at his gym as I recommend he do lap swimming with his upper body and using a float between his legs for cardio for the next week We discussed stretches he can do in the pool for the legs Over the next week as symptoms calm down somewhat he can use some of the other stretches and strengthening exercises discussed and demonstrated No running or elliptical or stationary bike yet. Follow-up in 2 weeks   Randy [name] was seen today for muscle pain.  Diagnoses and all orders for this visit:  Hamstring injury, left, initial encounter  Partial hamstring tear, left, initial encounter    Follow up: 2-3 weeks

## 2023-07-12 ENCOUNTER — Encounter: Payer: Self-pay | Admitting: Internal Medicine

## 2023-09-29 ENCOUNTER — Encounter: Payer: Self-pay | Admitting: Family Medicine

## 2023-09-29 ENCOUNTER — Ambulatory Visit: Payer: BC Managed Care – PPO | Admitting: Family Medicine

## 2023-09-29 VITALS — BP 130/90 | HR 76 | Ht 70.0 in | Wt 218.6 lb

## 2023-09-29 DIAGNOSIS — N400 Enlarged prostate without lower urinary tract symptoms: Secondary | ICD-10-CM

## 2023-09-29 DIAGNOSIS — S76312S Strain of muscle, fascia and tendon of the posterior muscle group at thigh level, left thigh, sequela: Secondary | ICD-10-CM

## 2023-09-29 DIAGNOSIS — J301 Allergic rhinitis due to pollen: Secondary | ICD-10-CM

## 2023-09-29 DIAGNOSIS — Z0000 Encounter for general adult medical examination without abnormal findings: Secondary | ICD-10-CM

## 2023-09-29 DIAGNOSIS — E663 Overweight: Secondary | ICD-10-CM

## 2023-09-29 DIAGNOSIS — L409 Psoriasis, unspecified: Secondary | ICD-10-CM | POA: Diagnosis not present

## 2023-09-29 DIAGNOSIS — Z23 Encounter for immunization: Secondary | ICD-10-CM

## 2023-09-29 DIAGNOSIS — K219 Gastro-esophageal reflux disease without esophagitis: Secondary | ICD-10-CM

## 2023-09-29 DIAGNOSIS — E785 Hyperlipidemia, unspecified: Secondary | ICD-10-CM | POA: Diagnosis not present

## 2023-09-29 DIAGNOSIS — I10 Essential (primary) hypertension: Secondary | ICD-10-CM

## 2023-09-29 DIAGNOSIS — Z125 Encounter for screening for malignant neoplasm of prostate: Secondary | ICD-10-CM

## 2023-09-29 DIAGNOSIS — Z8249 Family history of ischemic heart disease and other diseases of the circulatory system: Secondary | ICD-10-CM

## 2023-09-29 LAB — POCT URINALYSIS DIP (CLINITEK)
Bilirubin, UA: NEGATIVE
Blood, UA: NEGATIVE
Glucose, UA: NEGATIVE mg/dL
Ketones, POC UA: NEGATIVE mg/dL
Leukocytes, UA: NEGATIVE
Nitrite, UA: NEGATIVE
Spec Grav, UA: 1.01 (ref 1.010–1.025)
Urobilinogen, UA: 0.2 U/dL
pH, UA: 7 (ref 5.0–8.0)

## 2023-09-29 MED ORDER — ROSUVASTATIN CALCIUM 20 MG PO TABS: 20.0000 mg | ORAL_TABLET | Freq: Every day | ORAL | 3 refills | Status: AC

## 2023-09-29 MED ORDER — AMLODIPINE BESYLATE 10 MG PO TABS: 10.0000 mg | ORAL_TABLET | Freq: Every day | ORAL | 3 refills | Status: AC

## 2023-09-29 NOTE — Progress Notes (Signed)
 Complete physical exam  Patient: Randy [name]   DOB: 1966/04/22   58 y.o. Male  MRN: [id number]  Subjective:     Chief Complaint  Patient presents with   Annual Exam    Cpe. Fasting. Wants to know about adding a PSA test for blood work. Also a urine.     Randy [name] is a 58 y.o. male who presents today for a complete physical exam.  He reports consuming a general diet. He goes to the gym for exercise.  He generally feels fairly well. He reports sleeping fairly well.  He did sustain an injury to the left hamstring and was seen by Randy [name].  Apparently he did have some ecchymosis in the area indicating an extensive injury.  He was treated conservatively.  He does start jogging again but still has difficulty with tightness in the hamstring area and wants to pursue this further.  He does have a history of BPH and has seen urology in the past.  Presently he is not taking Flomax  or finasteride .  He would like to have a PSA done.  He has a history of reflux disease but is not having any present difficulty.  He also has a history of psoriasis but is presently having no difficulty with that.  Most recent fall risk assessment:    09/29/2023    8:21 AM  Fall Risk   Falls in the past year? 0  Number falls in past yr: 0  Injury with Fall? 0  Risk for fall due to : No Fall Risks  Follow up Falls evaluation completed     Most recent depression screenings:    09/29/2023    8:21 AM 09/16/2022    8:18 AM  PHQ 2/9 Scores  PHQ - 2 Score 0 0    Vision:Within last year and 2025.  and Dental: No current dental problems and Last dental visit: every 6 months.     Immunization History  Administered Date(s) Administered   Influenza Split 02/05/2011, 03/17/2012, 02/18/2013, 02/16/2014   Influenza Whole 03/09/2007, 02/18/2009, 03/01/2010   Influenza,inj,Quad PF,6+ Mos 02/07/2015, 01/26/2019, 03/22/2020, 02/20/2022   Influenza-Unspecified 03/05/2016, 03/10/2017, 02/22/2018, 03/01/2021    PFIZER(Purple Top)SARS-COV-2 Vaccination 08/09/2019, 09/03/2019, 04/25/2020   Pfizer Covid-19 Vaccine Bivalent Booster 46yrs & up 04/24/2021   Tdap 05/06/2014    Health Maintenance  Topic Date Due   Pneumococcal Vaccine 20-28 Years old (1 of 2 - PCV) Never done   Zoster Vaccines- Shingrix (1 of 2) Never done   COVID-19 Vaccine (5 - 2024-25 season) 01/16/2023   INFLUENZA VACCINE  12/16/2023   DTaP/Tdap/Td (2 - Td or Tdap) 05/06/2024   Colonoscopy  07/17/2025   Hepatitis C Screening  Completed   HIV Screening  Completed   HPV VACCINES  Aged Out   Meningococcal B Vaccine  Aged Out    Patient Care Team: Randy Hacking, MD as PCP - General (Family Medicine) Randy Quince, MD as PCP - Cardiology (Cardiology) Duke, Warren Haber, PA as Physician Assistant (Cardiology)   Outpatient Medications Prior to Visit  Medication Sig   aspirin  81 MG tablet Take 81 mg by mouth daily.   loratadine (CLARITIN) 10 MG tablet Take 10 mg by mouth every morning.   Multiple Vitamin (MULTIVITAMIN WITH MINERALS) TABS Take 1 tablet by mouth daily.   Omega-3 Fatty Acids (FISH OIL) 1200 MG CAPS Take 2,400 mg by mouth daily.   polyethylene glycol (MIRALAX  / GLYCOLAX ) 17 g packet Take 17 g by mouth  2 (two) times daily.   [DISCONTINUED] amLODipine  (NORVASC ) 10 MG tablet Take 1 tablet (10 mg total) by mouth daily.   [DISCONTINUED] rosuvastatin  (CRESTOR ) 20 MG tablet Take 1 tablet (20 mg total) by mouth daily.   acetaminophen  (TYLENOL ) 500 MG tablet Take 2 tablets (1,000 mg total) by mouth every 6 (six) hours as needed for mild pain. (Patient not taking: Reported on 03/22/2023)   finasteride  (PROSCAR ) 5 MG tablet TAKE 1 TABLET BY MOUTH EVERY DAY   ibuprofen  (ADVIL ) 600 MG tablet Take 1 tablet (600 mg total) by mouth every 8 (eight) hours as needed for moderate pain.   PROAIR  RESPICLICK 108 (90 Base) MCG/ACT AEPB INHALE 2 PUFFS INTO THE LUNGS 4 (FOUR) TIMES DAILY AS NEEDED. (Patient not taking: No sig reported)    No facility-administered medications prior to visit.    Review of Systems  All other systems reviewed and are negative.   Family and social history as well as health maintenance and immunizations was reviewed.     Objective:    BP (!) 130/90   Pulse 76   Ht 5\' 10"  (1.778 m)   Wt 218 lb 9.6 oz (99.2 kg)   SpO2 97%   BMI 31.37 kg/m    Physical Exam  Alert and in no distress. Tympanic membranes and canals are normal. Pharyngeal area is normal. Neck is supple without adenopathy or thyromegaly. Cardiac exam shows a regular sinus rhythm without murmurs or gallops. Lungs are clear to auscultation. Exam of the left posterior thigh area does show fullness to the midportion of the hamstrings medially.      Assessment & Plan:    Routine general medical examination at health care facility  Seasonal allergic rhinitis due to pollen  Psoriasis  Overweight (BMI 25.0-29.9)  Hyperlipidemia LDL goal <100 - Plan: Lipid panel, rosuvastatin  (CRESTOR ) 20 MG tablet  Gastroesophageal reflux disease without esophagitis  Essential hypertension - Plan: CBC with Differential/Platelet, Comprehensive metabolic panel with GFR, amLODipine  (NORVASC ) 10 MG tablet  Benign prostatic hyperplasia without lower urinary tract symptoms - Plan: POCT URINALYSIS DIP (CLINITEK)  Family history of heart disease in male family member before age 75  Need for Tdap vaccination - Plan: Tdap vaccine greater than or equal to 7yo IM  Hamstring strain, left, sequela - Plan: Ambulatory referral to Sports Medicine  Screening for prostate cancer - Plan: PSA, POCT URINALYSIS DIP (CLINITEK)  I will send him for an ultrasound to see if there is anything else that can be done other than conservative care. Also discussed immunizations with him however he is not interested in any other shots including pneumonia or Shingrix. Return in about 1 year (around 09/28/2024).      Randy Cobbs, MD

## 2023-09-30 LAB — CBC WITH DIFFERENTIAL/PLATELET
Basophils Absolute: 0.1 10*3/uL (ref 0.0–0.2)
Basos: 1 %
EOS (ABSOLUTE): 0.1 10*3/uL (ref 0.0–0.4)
Eos: 1 %
Hematocrit: 46.2 % (ref 37.5–51.0)
Hemoglobin: 14.8 g/dL (ref 13.0–17.7)
Immature Grans (Abs): 0 10*3/uL (ref 0.0–0.1)
Immature Granulocytes: 0 %
Lymphocytes Absolute: 1.7 10*3/uL (ref 0.7–3.1)
Lymphs: 33 %
MCH: 29.8 pg (ref 26.6–33.0)
MCHC: 32 g/dL (ref 31.5–35.7)
MCV: 93 fL (ref 79–97)
Monocytes Absolute: 0.3 10*3/uL (ref 0.1–0.9)
Monocytes: 6 %
Neutrophils Absolute: 3 10*3/uL (ref 1.4–7.0)
Neutrophils: 59 %
Platelets: 285 10*3/uL (ref 150–450)
RBC: 4.97 x10E6/uL (ref 4.14–5.80)
RDW: 13.2 % (ref 11.6–15.4)
WBC: 5.1 10*3/uL (ref 3.4–10.8)

## 2023-09-30 LAB — COMPREHENSIVE METABOLIC PANEL WITH GFR
ALT: 30 IU/L (ref 0–44)
AST: 27 IU/L (ref 0–40)
Albumin: 4.6 g/dL (ref 3.8–4.9)
Alkaline Phosphatase: 56 IU/L (ref 44–121)
BUN/Creatinine Ratio: 12 (ref 9–20)
BUN: 12 mg/dL (ref 6–24)
Bilirubin Total: 0.6 mg/dL (ref 0.0–1.2)
CO2: 24 mmol/L (ref 20–29)
Calcium: 9.1 mg/dL (ref 8.7–10.2)
Chloride: 103 mmol/L (ref 96–106)
Creatinine, Ser: 1.04 mg/dL (ref 0.76–1.27)
Globulin, Total: 2 g/dL (ref 1.5–4.5)
Glucose: 102 mg/dL — ABNORMAL HIGH (ref 70–99)
Potassium: 4.2 mmol/L (ref 3.5–5.2)
Sodium: 141 mmol/L (ref 134–144)
Total Protein: 6.6 g/dL (ref 6.0–8.5)
eGFR: 83 mL/min/{1.73_m2} (ref >59)

## 2023-09-30 LAB — LIPID PANEL
Chol/HDL Ratio: 1.9 ratio (ref 0.0–5.0)
Cholesterol, Total: 126 mg/dL (ref 100–199)
HDL: 65 mg/dL (ref >39)
LDL Chol Calc (NIH): 50 mg/dL (ref 0–99)
Triglycerides: 43 mg/dL (ref 0–149)
VLDL Cholesterol Cal: 11 mg/dL (ref 5–40)

## 2023-09-30 LAB — PSA: Prostate Specific Ag, Serum: 0.6 ng/mL (ref 0.0–4.0)

## 2023-10-01 ENCOUNTER — Ambulatory Visit: Payer: Self-pay | Admitting: Family Medicine

## 2023-10-31 ENCOUNTER — Other Ambulatory Visit: Payer: Self-pay

## 2023-10-31 ENCOUNTER — Ambulatory Visit: Admitting: Sports Medicine

## 2023-10-31 ENCOUNTER — Encounter: Payer: Self-pay | Admitting: Sports Medicine

## 2023-10-31 DIAGNOSIS — S76302A Unspecified injury of muscle, fascia and tendon of the posterior muscle group at thigh level, left thigh, initial encounter: Secondary | ICD-10-CM

## 2023-10-31 DIAGNOSIS — L905 Scar conditions and fibrosis of skin: Secondary | ICD-10-CM

## 2023-10-31 NOTE — Progress Notes (Signed)
 Randy [name] - 58 y.o. male MRN [id number]  Date of birth: [birth year]  Office Visit Note: Visit Date: 10/31/2023 PCP: Watson Hacking, MD Referred by: Watson Hacking, MD  Subjective: Chief Complaint  Patient presents with   left hamstring    Tore 05/2023, still tightens up with jogging or fast walking for 2 miles   HPI: Randy [name] is a pleasant 58 y.o. male who presents today for chronic left hamstring injury.  Back in January of this year he was running around the track and he felt and heard a pop in the mid to proximal hamstring.  Following this he had to stop running and he had quite a bit of bruising and ecchymosis for period of time.  He was seen by his primary physician who believed he had a hamstring tear.  Randy [name] has gotten back into running/jogging but continues to note some tightness in the hamstring, specifically when he starts getting up to 1 to 2 miles.  He has an upcoming 5K in July 4th.  Pertinent ROS were reviewed with the patient and found to be negative unless otherwise specified above in HPI.   Assessment & Plan: Visit Diagnoses:  1. Left hamstring injury, initial encounter   2. Scar tissue    Plan: Impression is hamstring tendon tear with both physical exam and ultrasound findings of resolving mild tenderness junction tearing with a degree of scar tissue.  Randy [name] has been able to run and exercise but feeling some tightness which is restriction from his pathology.  We discussed all treatment options, through shared decision making proceeded with a trial of extracorporeal shockwave therapy, patient tolerated well.  He will continue to train and run, I advised on long strides, sprinting and interval he will work at this time to avoid aggravation, otherwise we will train as his pain allows.  I would like to bring him back next week for an additional shockwave treatment and at that time we will get him started on the Askling hamstring exercise series if he is doing well.    Follow-up: Return in about 1 week (around 11/07/2023) for L-hamstring injury ESWT (reg visit).   Meds & Orders: No orders of the defined types were placed in this encounter.   Orders Placed This Encounter  Procedures   US  Extrem Low Left Ltd     Procedures: Procedure: ECSWT Indications:  Hamstring tear   Procedure Details Consent: Risks of procedure as well as the alternatives and risks of each were explained to the patient.  Verbal consent for procedure obtained. Time Out: Verified patient identification, verified procedure, site was marked, verified correct patient position. The area was cleaned with alcohol swab.     The left proximal to mid hamstring tendons (CJT) was targeted for Extracorporeal shockwave therapy.    Preset: Status post muscular injury Power Level: 120 mJ Frequency: 14 Hz Impulse/cycles: 3200 Head size: Regular   Patient tolerated procedure well without immediate complications.       Clinical History: No specialty comments available.  He reports that he has never smoked. He has never used smokeless tobacco. No results for input(s): HGBA1C, LABURIC in the last 8760 hours.  Objective:    Physical Exam  Gen: Well-appearing, in no acute distress; non-toxic CV: Well-perfused. Warm.  Resp: Breathing unlabored on room air; no wheezing. Psych: Fluid speech in conversation; appropriate affect; normal thought process  Ortho Exam - LLE: + Very mild TTP in the mid aspect at the proximal conjoined  tendon origin of the hamstrings.  There is appropriate muscle bulk.  There is mild residual pain with three-phase hamstring testing although no significant weakness.  No ecchymosis noted.  Full range of motion at the hip and knee.  Imaging: US  Extrem Low Left Ltd Result Date: 10/31/2023 Limited musculoskeletal ultrasound of the left lower extremity, left hamstrings was performed today.  Evaluation of the ischial tuberosity demonstrates no cortical regularity.   There is proper insertion of the conjoined tendon without notable tendon pathology.  Scanning distally to the interval of the myotendinous junction of the semitendinosis and biceps femoris long head there is hypoechoic change surrounding the conjoined tendon indicative of mild fluid as well as degree of scar tissue from prior interval healing.  There is no full-thickness tearing nor tendon retraction.  Mild hyperemia in this location.  Scanning distally there is no abnormality of the distal biceps femoris or the semitendinosis.   Past Medical/Family/Surgical/Social History: Medications & Allergies reviewed per EMR, new medications updated. Patient Active Problem List   Diagnosis Date Noted   Hematospermia 08/19/2022   Benign prostatic hyperplasia without lower urinary tract symptoms 06/26/2021   History of umbilical hernia repair 10/30/2020   Seasonal allergic rhinitis due to pollen 06/13/2017   Psoriasis 05/06/2014   Nonspecific abnormal electrocardiogram (ECG) (EKG) 01/11/2012   Family history of heart disease in male family member before age 79 03/29/2011   Overweight (BMI 25.0-29.9) 03/29/2011   Hyperlipidemia LDL goal <100 03/31/2010   Essential hypertension 03/20/2010   GERD 09/23/2008   Past Medical History:  Diagnosis Date   Family history of ischemic heart disease    Febrile seizure (HCC)    AS A CHILD   GERD (gastroesophageal reflux disease)    RARE- NO MEDS   History of hiatal hernia    SMALL   Hypertension    Obesity    Psoriasis    Seasonal allergies    Venous insufficiency    Family History  Problem Relation Age of Onset   Heart disease Father    Hypertension Father    Heart failure Father    Heart attack Father 61       both age 22 & 86   Colon cancer Neg Hx    Past Surgical History:  Procedure Laterality Date   CYSTECTOMY     left chest   superficial vein removed Bilateral    LEGS   TOE SURGERY     right great toe   UMBILICAL HERNIA REPAIR N/A  10/16/2020   Procedure: HERNIA REPAIR UMBILICAL ADULT;  Surgeon: Emmalene Hare, MD;  Location: [facility] ORS;  Service: General;  Laterality: N/A;   Social History   Occupational History   Not on file  Tobacco Use   Smoking status: Never   Smokeless tobacco: Never  Vaping Use   Vaping status: Never Used  Substance and Sexual Activity   Alcohol use: Yes    Alcohol/week: 3.0 standard drinks of alcohol    Types: 3 Standard drinks or equivalent per week    Comment: OCC   Drug use: No   Sexual activity: Yes

## 2023-11-08 ENCOUNTER — Ambulatory Visit: Admitting: Sports Medicine

## 2023-11-08 ENCOUNTER — Encounter: Payer: Self-pay | Admitting: Sports Medicine

## 2023-11-08 DIAGNOSIS — L905 Scar conditions and fibrosis of skin: Secondary | ICD-10-CM | POA: Diagnosis not present

## 2023-11-08 DIAGNOSIS — S76302S Unspecified injury of muscle, fascia and tendon of the posterior muscle group at thigh level, left thigh, sequela: Secondary | ICD-10-CM

## 2023-11-08 NOTE — Progress Notes (Signed)
 Office Visit Note   Patient: Randy [name]           Date of Birth: January 17, 1966           MRN: [id number] Visit Date: 11/08/2023              Requested by: [name] [name], MD [address] [place],  [place] [address] PCP: [name] [name], MD  Medical Resident, Sports Medicine Fellow - Attending Physician Addendum:   I have independently interviewed and examined the patient myself. I have discussed the above with the original author and agree with their documentation. My edits for correction/addition/clarification have been made, see any changes above and below.   In summary, pleasant 58 year old male with follow-up from previous hamstring tear with a degree of scar tissue.  We did move forward with our second treatment of extracorporeal shockwave therapy.  Given that he is able to continue training, he may run as his pain allows.  I do want him to get started in formalized therapy, he is agreeable to home rehab.  We did print out a customized hamstring and Askling hamstring exercises for him today and my athletic trainer, Jinnie, did review these with him today.  He will perform these once to twice daily.  I will have him follow-up 1 additional time to see what sort of cumulative benefit he is receiving from shockwave as well as his hamstring rehab protocol.  Recommended hamstring compression sleeve with activity.  Lonell Sprang, DO Primary Care Sports Medicine Physician  [facility] - Orthoped[facility]   Assessment & Plan: Visit Diagnoses:  1. Left hamstring injury, sequela   2. Scar tissue    Plan: Patient following up for left hamstring tear.  Patient notes improvement and did have some benefit after previous shockwave therapy.  Will go ahead and repeat shockwave therapy again today.  Did discuss with patient that given his improvement, we will go ahead and start the Askling hamstring exercises for further benefit.  Patient also advised to wear compression sleeve.  Will  follow-up in 1 week and do 1 more session of shockwave therapy and evaluate from there.  Follow-Up Instructions: Return for f/u in 7-10 days for L-hamstring.   Procedures: Procedure: ECSWT Indications:  Hamstring tear   Procedure Details Consent: Risks of procedure as well as the alternatives and risks of each were explained to the patient.  Verbal consent for procedure obtained. Time Out: Verified patient identification, verified procedure, site was marked, verified correct patient position. The area was cleaned with alcohol swab.     The left proximal to mid hamstring tendons (CJT) was targeted for Extracorporeal shockwave therapy.    Preset: Status post muscular injury Power Level: 120 mJ Frequency: 14 Hz Impulse/cycles: 3200 Head size: Regular   Patient tolerated procedure well without immediate complications.   Clinical Data: No additional findings.   Subjective: Chief Complaint  Patient presents with   Left Leg - Follow-up    Patient is presenting for follow-up of his left hamstring tear.  Patient notes that he still been running and does feel little bit better since last week.  Patient states that whenever he runs he feels some tightness but otherwise no other significant pain.  Patient did note some soreness after shockwave therapy but that that eventually subsided.  Patient has not been wearing any compression sleeve over the area.  Patient otherwise has no other concerns at this time and is doing well.    Review of Systems  Objective: Vital Signs: There were no vitals taken for this visit.  Physical Exam Inspection reveals no gross abnormalities of the left hamstring.  Mild tenderness to palpation at the mid body near the conjoined tendon.  Range of motion is full with flexion extension.  Strength is 5 out of 5. Ortho Exam  Specialty Comments:  No specialty comments available.  Imaging: No results found.   PMFS History: Patient Active Problem List    Diagnosis Date Noted   Hematospermia 08/19/2022   Benign prostatic hyperplasia without lower urinary tract symptoms 06/26/2021   History of umbilical hernia repair 10/30/2020   Seasonal allergic rhinitis due to pollen 06/13/2017   Psoriasis 05/06/2014   Nonspecific abnormal electrocardiogram (ECG) (EKG) 01/11/2012   Family history of heart disease in male family member before age 38 03/29/2011   Overweight (BMI 25.0-29.9) 03/29/2011   Hyperlipidemia LDL goal <100 03/31/2010   Essential hypertension 03/20/2010   GERD 09/23/2008   Past Medical History:  Diagnosis Date   Family history of ischemic heart disease    Febrile seizure (HCC)    AS A CHILD   GERD (gastroesophageal reflux disease)    RARE- NO MEDS   History of hiatal hernia    SMALL   Hypertension    Obesity    Psoriasis    Seasonal allergies    Venous insufficiency     Family History  Problem Relation Age of Onset   Heart disease Father    Hypertension Father    Heart failure Father    Heart attack Father 17       both age 16 & 85   Colon cancer Neg Hx     Past Surgical History:  Procedure Laterality Date   CYSTECTOMY     left chest   superficial vein removed Bilateral    LEGS   TOE SURGERY     right great toe   UMBILICAL HERNIA REPAIR N/A 10/16/2020   Procedure: HERNIA REPAIR UMBILICAL ADULT;  Surgeon: Desiderio Schanz, MD;  Location: [facility] ORS;  Service: General;  Laterality: N/A;   Social History   Occupational History   Not on file  Tobacco Use   Smoking status: Never   Smokeless tobacco: Never  Vaping Use   Vaping status: Never Used  Substance and Sexual Activity   Alcohol use: Yes    Alcohol/week: 3.0 standard drinks of alcohol    Types: 3 Standard drinks or equivalent per week    Comment: OCC   Drug use: No   Sexual activity: Yes

## 2023-11-08 NOTE — Progress Notes (Deleted)
 Randy [name] - 58 y.o. male MRN [id number]  Date of birth: [birth year]  Office Visit Note: Visit Date: 11/08/2023 PCP: [name] [name], MD Referred by: [name] [name], MD  Subjective: Chief Complaint  Patient presents with   Left Leg - Follow-up   HPI: Randy [name] is a pleasant 58 y.o. male who presents today for ***  Pertinent ROS were reviewed with the patient and found to be negative unless otherwise specified above in HPI.   Assessment & Plan: Visit Diagnoses: No diagnosis found.  Plan: ***  Follow-up: No follow-ups on file.   Meds & Orders: No orders of the defined types were placed in this encounter.  No orders of the defined types were placed in this encounter.    Procedures: No procedures performed      Clinical History: No specialty comments available.  He reports that he has never smoked. He has never used smokeless tobacco. No results for input(s): HGBA1C, LABURIC in the last 8760 hours.  Objective:   Vital Signs: There were no vitals taken for this visit.  Physical Exam  Gen: Well-appearing, in no acute distress; non-toxic CV: Well-perfused. Warm.  Resp: Breathing unlabored on room air; no wheezing. Psych: Fluid speech in conversation; appropriate affect; normal thought process  Ortho Exam - ***  Imaging: No results found.  Past Medical/Family/Surgical/Social History: Medications & Allergies reviewed per EMR, new medications updated. Patient Active Problem List   Diagnosis Date Noted   Hematospermia 08/19/2022   Benign prostatic hyperplasia without lower urinary tract symptoms 06/26/2021   History of umbilical hernia repair 10/30/2020   Seasonal allergic rhinitis due to pollen 06/13/2017   Psoriasis 05/06/2014   Nonspecific abnormal electrocardiogram (ECG) (EKG) 01/11/2012   Family history of heart disease in male family member before age 40 03/29/2011   Overweight (BMI 25.0-29.9) 03/29/2011   Hyperlipidemia LDL goal <100  03/31/2010   Essential hypertension 03/20/2010   GERD 09/23/2008   Past Medical History:  Diagnosis Date   Family history of ischemic heart disease    Febrile seizure (HCC)    AS A CHILD   GERD (gastroesophageal reflux disease)    RARE- NO MEDS   History of hiatal hernia    SMALL   Hypertension    Obesity    Psoriasis    Seasonal allergies    Venous insufficiency    Family History  Problem Relation Age of Onset   Heart disease Father    Hypertension Father    Heart failure Father    Heart attack Father 78       both age 25 & 61   Colon cancer Neg Hx    Past Surgical History:  Procedure Laterality Date   CYSTECTOMY     left chest   superficial vein removed Bilateral    LEGS   TOE SURGERY     right great toe   UMBILICAL HERNIA REPAIR N/A 10/16/2020   Procedure: HERNIA REPAIR UMBILICAL ADULT;  Surgeon: Desiderio Schanz, MD;  Location: [facility] ORS;  Service: General;  Laterality: N/A;   Social History   Occupational History   Not on file  Tobacco Use   Smoking status: Never   Smokeless tobacco: Never  Vaping Use   Vaping status: Never Used  Substance and Sexual Activity   Alcohol use: Yes    Alcohol/week: 3.0 standard drinks of alcohol    Types: 3 Standard drinks or equivalent per week    Comment: OCC   Drug use: No  Sexual activity: Yes

## 2023-11-08 NOTE — Progress Notes (Signed)
 Patient says that he had some soreness later in the day after his first shockwave therapy, but this was gone the next day. He has been able to continue with his training and running. He says that his hamstring still feels a bit tight, and he may have noticed some improvement, but not much.  Patient was instructed in 10 minutes of therapeutic exercises for hamstrings to improve strength, ROM and function according to my instructions and plan of care by a Certified Athletic Trainer during the office visit. A customized handout was provided and demonstration of proper technique shown and discussed. Patient did perform exercises and demonstrate understanding through teachback.  All questions discussed and answered.

## 2023-11-14 ENCOUNTER — Ambulatory Visit: Admitting: Sports Medicine

## 2023-11-14 ENCOUNTER — Encounter: Payer: Self-pay | Admitting: Sports Medicine

## 2023-11-14 DIAGNOSIS — S76302S Unspecified injury of muscle, fascia and tendon of the posterior muscle group at thigh level, left thigh, sequela: Secondary | ICD-10-CM

## 2023-11-14 DIAGNOSIS — L905 Scar conditions and fibrosis of skin: Secondary | ICD-10-CM

## 2023-11-14 NOTE — Progress Notes (Signed)
 Randy [name] - 58 y.o. male MRN [id number]  Date of birth: [birth year]  Office Visit Note: Visit Date: 11/14/2023 PCP: Randy Norleen [name], MD Referred by: Randy Norleen [name], MD  Subjective: No chief complaint on file.  HPI: Randy [name] is a pleasant 58 y.o. male who presents today for follow-up of previous left hamstring partial tear.  We have performed 2 treatments of extracorporeal shockwave therapy thus far, he is feeling fine but no significant improvement.  We did review with him Askling hamstring exercises after our last visit which he has been progressing through.  Still continues to be able to run, feels some soreness in the hamstring when he starts pushing past 2 miles.  He did wear the hamstring compression sleeve and this feels well with that.  Pertinent ROS were reviewed with the patient and found to be negative unless otherwise specified above in HPI.   Assessment & Plan: Visit Diagnoses:  1. Left hamstring injury, sequela   2. Scar tissue    Plan: Impression is previous hamstring tear with a degree of scar tissue on previous ultrasound, he has been progressing through Askling hamstring exercises and a few treatments of extracorporeal shockwave therapy.  We did repeat his third treatment of ESWT today, patient tolerated well.  He has been able to continue training but still has some soreness when pushing past 2 miles or more vigorous running.  I am okay with him continuing to run, he will keep his hamstring compression sleeve with running and activity.  Continue HEP on a daily basis, did emphasize consistent use of this.  Now that we have performed a few shockwave treatments and he has been doing his therapy, I would like to bring him back in about 2 weeks for reevaluation and ultrasound of the location to see the degree of his tear and previous scar tissue.  Follow-up: Return in about 2 weeks (around 11/28/2023) for for Ultrasound of L-hamstring (30-min).   Meds & Orders: No  orders of the defined types were placed in this encounter.  No orders of the defined types were placed in this encounter.    Procedures: Procedure: ECSWT Indications:  Hamstring tear   Procedure Details Consent: Risks of procedure as well as the alternatives and risks of each were explained to the patient.  Verbal consent for procedure obtained. Time Out: Verified patient identification, verified procedure, site was marked, verified correct patient position. The area was cleaned with alcohol swab.     The left proximal to mid hamstring tendons (CJT) was targeted for Extracorporeal shockwave therapy.    Preset: Status post muscular injury Power Level: 90-110 mJ Frequency: 10-12 Hz Impulse/cycles: 3200 Head size: Regular   Patient tolerated procedure well without immediate complications.      Clinical History: No specialty comments available.  He reports that he has never smoked. He has never used smokeless tobacco. No results for input(s): HGBA1C, LABURIC in the last 8760 hours.  Objective:    Physical Exam  Gen: Well-appearing, in no acute distress; non-toxic CV: Well-perfused. Warm.  Resp: Breathing unlabored on room air; no wheezing. Psych: Fluid speech in conversation; appropriate affect; normal thought process  Ortho Exam - Left hamstring: There is good muscle bulk of the hamstring tendons.  No redness swelling or ecchymosis.  No significant tenderness with palpation although there is tenderness at the mid to distal aspect of the biceps femoris with shockwave treatment today.  Range of motion is full with knee flexion and extension.  Good preserved strength with three-phase hamstring testing.  Imaging: No results found.  Past Medical/Family/Surgical/Social History: Medications & Allergies reviewed per EMR, new medications updated. Patient Active Problem List   Diagnosis Date Noted   Hematospermia 08/19/2022   Benign prostatic hyperplasia without lower urinary  tract symptoms 06/26/2021   History of umbilical hernia repair 10/30/2020   Seasonal allergic rhinitis due to pollen 06/13/2017   Psoriasis 05/06/2014   Nonspecific abnormal electrocardiogram (ECG) (EKG) 01/11/2012   Family history of heart disease in male family member before age 33 03/29/2011   Overweight (BMI 25.0-29.9) 03/29/2011   Hyperlipidemia LDL goal <100 03/31/2010   Essential hypertension 03/20/2010   GERD 09/23/2008   Past Medical History:  Diagnosis Date   Family history of ischemic heart disease    Febrile seizure (HCC)    AS A CHILD   GERD (gastroesophageal reflux disease)    RARE- NO MEDS   History of hiatal hernia    SMALL   Hypertension    Obesity    Psoriasis    Seasonal allergies    Venous insufficiency    Family History  Problem Relation Age of Onset   Heart disease Father    Hypertension Father    Heart failure Father    Heart attack Father 78       both age 58 & 43   Colon cancer Neg Hx    Past Surgical History:  Procedure Laterality Date   CYSTECTOMY     left chest   superficial vein removed Bilateral    LEGS   TOE SURGERY     right great toe   UMBILICAL HERNIA REPAIR N/A 10/16/2020   Procedure: HERNIA REPAIR UMBILICAL ADULT;  Surgeon: Desiderio Schanz, MD;  Location: [facility] ORS;  Service: General;  Laterality: N/A;   Social History   Occupational History   Not on file  Tobacco Use   Smoking status: Never   Smokeless tobacco: Never  Vaping Use   Vaping status: Never Used  Substance and Sexual Activity   Alcohol use: Yes    Alcohol/week: 3.0 standard drinks of alcohol    Types: 3 Standard drinks or equivalent per week    Comment: OCC   Drug use: No   Sexual activity: Yes

## 2023-11-28 ENCOUNTER — Ambulatory Visit: Admitting: Sports Medicine

## 2023-11-30 ENCOUNTER — Encounter: Payer: Self-pay | Admitting: Sports Medicine

## 2023-11-30 ENCOUNTER — Ambulatory Visit: Admitting: Sports Medicine

## 2023-11-30 ENCOUNTER — Other Ambulatory Visit (INDEPENDENT_AMBULATORY_CARE_PROVIDER_SITE_OTHER)

## 2023-11-30 DIAGNOSIS — S76302D Unspecified injury of muscle, fascia and tendon of the posterior muscle group at thigh level, left thigh, subsequent encounter: Secondary | ICD-10-CM

## 2023-11-30 DIAGNOSIS — S76302S Unspecified injury of muscle, fascia and tendon of the posterior muscle group at thigh level, left thigh, sequela: Secondary | ICD-10-CM

## 2023-11-30 NOTE — Progress Notes (Signed)
 Patient says that he is still noticing some improvements from the shockwave therapy. He has continued to do his exercises at home and says those are going well.

## 2023-11-30 NOTE — Progress Notes (Addendum)
 Randy [name] - 58 y.o. male MRN [id number]  Date of birth: 07/06/1965  Office Visit Note: Visit Date: 11/30/2023 PCP: Randy Norleen [name], MD Referred by: Randy Norleen [name], MD  Subjective: Chief Complaint  Patient presents with   Left Leg - Follow-up   HPI: Randy [name] is a pleasant 58 y.o. male who presents today for follow-up of prior left hamstring partial tear.  Randy [name] continues to notice improvements.  He is no longer feeling any pain or restriction with running.  Was able to run the 5K in July.  He has not yet got back to his 8:30/8:40 min/mile pace, but is close to 62min/mile (from original 9:30 min/mile +).  He continues his home exercises.  Pertinent ROS were reviewed with the patient and found to be negative unless otherwise specified above in HPI.   Assessment & Plan: Visit Diagnoses:  1. Left hamstring injury, sequela    Plan: Impression is both symptomatic and ultrasound findings of significantly healing biceps femoris partial tendon tear.  We did rescan his hamstrings (biceps femoris) over the area of concern and compared it from his previous ultrasound image.  He no longer has any tendon separation and does have resolution of his hematoma.  Reported based on his ultrasound this is visualized to be about 95% healed.  Still has very minimal hypoechoic change near the myotendinous junction.  At this point, I believe Randy [name] is safe to continue pushing his running pace and resuming normal activities as his pain allows.  He is no longer having any tightness or restriction in terms of running, so in terms of running he has a very minimal risk of reinjury given his overall healing.  I would like him to continue his [place] hamstring exercises for the next 6 weeks for both treatment and prevention.  We did discuss Nordic hamstring curls and modified Nordic activities that he may start upcoming in August slowly to help further improve his hamstring quality and work on preventative measures.   He will follow-up with me as needed.  Follow-up: Return if symptoms worsen or fail to improve.   Meds & Orders: No orders of the defined types were placed in this encounter.   Orders Placed This Encounter  Procedures   US  Extrem Low Left Ltd     Procedures: No procedures performed      Clinical History: No specialty comments available.  He reports that he has never smoked. He has never used smokeless tobacco. No results for input(s): HGBA1C, LABURIC in the last 8760 hours.  Objective:    Physical Exam  Gen: Well-appearing, in no acute distress; non-toxic CV: Well-perfused. Warm.  Resp: Breathing unlabored on room air; no wheezing. Psych: Fluid speech in conversation; appropriate affect; normal thought process  Ortho Exam - There is good muscle bulk of the hamstrings.  Full range of motion at the knee with flexion and extension.  There is very minimal pain with deep palpation over the mid to distal aspect of the biceps femoris.  Imaging: US  Extrem Low Left Ltd Result Date: 11/30/2023 Limited ultrasound of the left lower extremity, left hamstrings was performed today.  The distal hamstring tendons of the semitendinosus and semimembranosus was seen in short and long axis with proper insertion without any tendon abnormality.  Scanning laterally to the biceps femoris tendon there is intact insertion over the fibular head and the posterior aspect of the lateral knee without any evidence of tearing or abnormality.  Scanning to the mid to  distal biceps femoris there is very minimal hypoechoic change near the region of the conjoined tendon in his previous area of concern.  We did compare this to his previous ultrasound which shows vast improvement indicative of significant interval healing.  There is no longer any tendon separation or significant hematoma formation.  No residual scar tissue noted.   Past Medical/Family/Surgical/Social History: Medications & Allergies reviewed per EMR,  new medications updated. Patient Active Problem List   Diagnosis Date Noted   Hematospermia 08/19/2022   Benign prostatic hyperplasia without lower urinary tract symptoms 06/26/2021   History of umbilical hernia repair 10/30/2020   Seasonal allergic rhinitis due to pollen 06/13/2017   Psoriasis 05/06/2014   Nonspecific abnormal electrocardiogram (ECG) (EKG) 01/11/2012   Family history of heart disease in male family member before age 31 03/29/2011   Overweight (BMI 25.0-29.9) 03/29/2011   Hyperlipidemia LDL goal <100 03/31/2010   Essential hypertension 03/20/2010   GERD 09/23/2008   Past Medical History:  Diagnosis Date   Family history of ischemic heart disease    Febrile seizure (HCC)    AS A CHILD   GERD (gastroesophageal reflux disease)    RARE- NO MEDS   History of hiatal hernia    SMALL   Hypertension    Obesity    Psoriasis    Seasonal allergies    Venous insufficiency    Family History  Problem Relation Age of Onset   Heart disease Father    Hypertension Father    Heart failure Father    Heart attack Father 71       both age 71 & 65   Colon cancer Neg Hx    Past Surgical History:  Procedure Laterality Date   CYSTECTOMY     left chest   superficial vein removed Bilateral    LEGS   TOE SURGERY     right great toe   UMBILICAL HERNIA REPAIR N/A 10/16/2020   Procedure: HERNIA REPAIR UMBILICAL ADULT;  Surgeon: Desiderio Schanz, MD;  Location: [facility] ORS;  Service: General;  Laterality: N/A;   Social History   Occupational History   Not on file  Tobacco Use   Smoking status: Never   Smokeless tobacco: Never  Vaping Use   Vaping status: Never Used  Substance and Sexual Activity   Alcohol use: Yes    Alcohol/week: 3.0 standard drinks of alcohol    Types: 3 Standard drinks or equivalent per week    Comment: OCC   Drug use: No   Sexual activity: Yes

## 2024-03-09 NOTE — Progress Notes (Signed)
 HPI: FU hypertension. Venous Dopplers in April of 2014 showed venous insufficiency. Lower extremity arterial dopplers in May 2014 normal. Seen by Dr Darron in Sept 2014 for venous insuff and initial conservative measures recommended. Venous ablation could be considered if remains symptomatic despite these measures. Stress echocardiogram February 2015 normal.  Calcium  score June 2022 9.55 which was 53rd percentile. Abdominal ultrasound 5/24 showed ectatic iliacs but no AAA. Since last seen, the patient denies any dyspnea on exertion, orthopnea, PND, pedal edema, palpitations, syncope or chest pain.   Current Outpatient Medications  Medication Sig Dispense Refill   amLODipine  (NORVASC ) 10 MG tablet Take 1 tablet (10 mg total) by mouth daily. 90 tablet 3   aspirin  81 MG tablet Take 81 mg by mouth daily.     loratadine (CLARITIN) 10 MG tablet Take 10 mg by mouth every morning.     Multiple Vitamin (MULTIVITAMIN WITH MINERALS) TABS Take 1 tablet by mouth daily.     Omega-3 Fatty Acids (FISH OIL) 1200 MG CAPS Take 2,400 mg by mouth daily.     polyethylene glycol (MIRALAX  / GLYCOLAX ) 17 g packet Take 17 g by mouth 2 (two) times daily. 14 each 0   rosuvastatin  (CRESTOR ) 20 MG tablet Take 1 tablet (20 mg total) by mouth daily. 90 tablet 3   acetaminophen  (TYLENOL ) 500 MG tablet Take 2 tablets (1,000 mg total) by mouth every 6 (six) hours as needed for mild pain. (Patient not taking: Reported on 03/22/2024)     finasteride  (PROSCAR ) 5 MG tablet TAKE 1 TABLET BY MOUTH EVERY DAY 90 tablet 3   ibuprofen  (ADVIL ) 600 MG tablet Take 1 tablet (600 mg total) by mouth every 8 (eight) hours as needed for moderate pain. 60 tablet 1   PROAIR  RESPICLICK 108 (90 Base) MCG/ACT AEPB INHALE 2 PUFFS INTO THE LUNGS 4 (FOUR) TIMES DAILY AS NEEDED. (Patient not taking: No sig reported) 1 each 0   No current facility-administered medications for this visit.     Past Medical History:  Diagnosis Date   Family history of  ischemic heart disease    Febrile seizure (HCC)    AS A CHILD   GERD (gastroesophageal reflux disease)    RARE- NO MEDS   History of hiatal hernia    SMALL   Hypertension    Obesity    Psoriasis    Seasonal allergies    Venous insufficiency     Past Surgical History:  Procedure Laterality Date   CYSTECTOMY     left chest   superficial vein removed Bilateral    LEGS   TOE SURGERY     right great toe   UMBILICAL HERNIA REPAIR N/A 10/16/2020   Procedure: HERNIA REPAIR UMBILICAL ADULT;  Surgeon: Desiderio Schanz, MD;  Location: [facility] ORS;  Service: General;  Laterality: N/A;    Social History   Socioeconomic History   Marital status: Married    Spouse name: Not on file   Number of children: Not on file   Years of education: Not on file   Highest education level: Not on file  Occupational History   Not on file  Tobacco Use   Smoking status: Never   Smokeless tobacco: Never  Vaping Use   Vaping status: Never Used  Substance and Sexual Activity   Alcohol use: Yes    Alcohol/week: 3.0 standard drinks of alcohol    Types: 3 Standard drinks or equivalent per week    Comment: OCC   Drug use:  No   Sexual activity: Yes  Other Topics Concern   Not on file  Social History Narrative   Not on file   Social Drivers of Health   Financial Resource Strain: Not on file  Food Insecurity: Not on file  Transportation Needs: Not on file  Physical Activity: Not on file  Stress: Not on file  Social Connections: Not on file  Intimate Partner Violence: Not on file    Family History  Problem Relation Age of Onset   Heart disease Father    Hypertension Father    Heart failure Father    Heart attack Father 38       both age 36 & 65   Colon cancer Neg Hx     ROS: no fevers or chills, productive cough, hemoptysis, dysphasia, odynophagia, melena, hematochezia, dysuria, hematuria, rash, seizure activity, orthopnea, PND, pedal edema, claudication. Remaining systems are  negative.  Physical Exam: Well-developed well-nourished in no acute distress.  Skin is warm and dry.  HEENT is normal.  Neck is supple.  Chest is clear to auscultation with normal expansion.  Cardiovascular exam is regular rate and rhythm.  Abdominal exam nontender or distended. No masses palpated. Extremities show no edema. neuro grossly intact  EKG Interpretation Date/Time:  Thursday March 22 2024 07:52:59 EST Ventricular Rate:  80 PR Interval:  214 QRS Duration:  102 QT Interval:  382 QTC Calculation: 440 R Axis:   82  Text Interpretation: Sinus rhythm with 1st degree A-V block Confirmed by Pietro Rogue ([id number]) on 03/22/2024 7:54:18 AM    A/P  1 hypertension-BP controlled; continue present meds and follow.   2 hyperlipidemia-continue statin.  Most recent LDL 50 and liver functions normal.   3 mildly elevated calcium  score-no chest pain.  Continue statin.   4 lower extremity edema-chronic and unchanged.  5 Ectatic iliac arteries-arrange fu ultrasound to reassess.  Rogue Pietro, MD

## 2024-03-19 ENCOUNTER — Encounter: Payer: Self-pay | Admitting: Radiology

## 2024-03-22 ENCOUNTER — Ambulatory Visit: Attending: Cardiology | Admitting: Cardiology

## 2024-03-22 ENCOUNTER — Encounter: Payer: Self-pay | Admitting: Cardiology

## 2024-03-22 VITALS — BP 132/84 | HR 80 | Ht 70.0 in | Wt 221.4 lb

## 2024-03-22 DIAGNOSIS — I2510 Atherosclerotic heart disease of native coronary artery without angina pectoris: Secondary | ICD-10-CM | POA: Diagnosis not present

## 2024-03-22 DIAGNOSIS — R600 Localized edema: Secondary | ICD-10-CM | POA: Diagnosis not present

## 2024-03-22 DIAGNOSIS — I10 Essential (primary) hypertension: Secondary | ICD-10-CM | POA: Diagnosis not present

## 2024-03-22 DIAGNOSIS — E785 Hyperlipidemia, unspecified: Secondary | ICD-10-CM

## 2024-03-22 DIAGNOSIS — I723 Aneurysm of iliac artery: Secondary | ICD-10-CM

## 2024-03-22 NOTE — Patient Instructions (Signed)
   Testing/Procedures:  Your physician has requested that you have an abdominal aorta duplex. During this test, an ultrasound is used to evaluate the aorta. Allow 30 minutes for this exam. Do not eat after midnight the day before and avoid carbonated beverages.  Please note: We ask at that you not bring children with you during ultrasound (echo/ vascular) testing. Due to room size and safety concerns, children are not allowed in the ultrasound rooms during exams. Our front office staff cannot provide observation of children in our lobby area while testing is being conducted. An adult accompanying a patient to their appointment will only be allowed in the ultrasound room at the discretion of the ultrasound technician under special circumstances. We apologize for any inconvenience. [address]  Follow-Up: At [facility], you and your health needs are our priority.  As part of our continuing mission to provide you with exceptional heart care, our providers are all part of one team.  This team includes your primary Cardiologist (physician) and Advanced Practice Providers or APPs (Physician Assistants and Nurse Practitioners) who all work together to provide you with the care you need, when you need it.  Your next appointment:   12 month(s)  Provider:   Redell Shallow, MD

## 2024-03-30 ENCOUNTER — Ambulatory Visit: Payer: Self-pay | Admitting: Cardiology

## 2024-03-30 ENCOUNTER — Ambulatory Visit (HOSPITAL_COMMUNITY)
Admission: RE | Admit: 2024-03-30 | Discharge: 2024-03-30 | Disposition: A | Discharge status: Home / Self Care | Source: Ambulatory Visit | Attending: Cardiology | Admitting: Cardiology

## 2024-03-30 DIAGNOSIS — I723 Aneurysm of iliac artery: Secondary | ICD-10-CM | POA: Diagnosis not present

## 2024-04-16 ENCOUNTER — Encounter: Payer: Self-pay | Admitting: Family Medicine

## 2024-05-14 ENCOUNTER — Other Ambulatory Visit: Payer: Self-pay

## 2024-05-14 DIAGNOSIS — I10 Essential (primary) hypertension: Secondary | ICD-10-CM

## 2024-10-03 ENCOUNTER — Encounter: Payer: Self-pay | Admitting: Family Medicine
